# Patient Record
Sex: Female | Born: 1955 | Race: White | Hispanic: No | Smoking: Former smoker
Health system: Southern US, Community
[De-identification: ages and names within clinical notes are randomized; demographics above are authoritative.]

## PROBLEM LIST (undated history)

## (undated) DIAGNOSIS — K219 Gastro-esophageal reflux disease without esophagitis: Secondary | ICD-10-CM

## (undated) DIAGNOSIS — R51 Headache: Secondary | ICD-10-CM

## (undated) DIAGNOSIS — H9190 Unspecified hearing loss, unspecified ear: Secondary | ICD-10-CM

## (undated) DIAGNOSIS — I447 Left bundle-branch block, unspecified: Secondary | ICD-10-CM

## (undated) DIAGNOSIS — F32A Depression, unspecified: Secondary | ICD-10-CM

## (undated) DIAGNOSIS — I456 Pre-excitation syndrome: Secondary | ICD-10-CM

## (undated) DIAGNOSIS — F329 Major depressive disorder, single episode, unspecified: Secondary | ICD-10-CM

## (undated) HISTORY — PX: TUBAL LIGATION: SHX77

## (undated) HISTORY — DX: Depression, unspecified: F32.A

## (undated) HISTORY — DX: Gastro-esophageal reflux disease without esophagitis: K21.9

## (undated) HISTORY — DX: Left bundle-branch block, unspecified: I44.7

## (undated) HISTORY — PX: BIOPSY BOWEL: PRO7

## (undated) HISTORY — DX: Major depressive disorder, single episode, unspecified: F32.9

## (undated) HISTORY — PX: BREAST SURGERY: SHX581

## (undated) HISTORY — DX: Headache: R51

---

## 1998-01-02 ENCOUNTER — Ambulatory Visit (HOSPITAL_COMMUNITY): Admission: RE | Admit: 1998-01-02 | Discharge: 1998-01-02 | Payer: Self-pay | Admitting: Family Medicine

## 1998-01-08 ENCOUNTER — Ambulatory Visit (HOSPITAL_COMMUNITY): Admission: RE | Admit: 1998-01-08 | Discharge: 1998-01-08 | Payer: Self-pay | Admitting: Family Medicine

## 1998-01-14 ENCOUNTER — Ambulatory Visit (HOSPITAL_COMMUNITY): Admission: RE | Admit: 1998-01-14 | Discharge: 1998-01-14 | Payer: Self-pay | Admitting: Family Medicine

## 1998-04-20 HISTORY — PX: PAROTID GLAND TUMOR EXCISION: SHX5221

## 1999-02-27 ENCOUNTER — Encounter: Payer: Self-pay | Admitting: Family Medicine

## 1999-02-27 ENCOUNTER — Ambulatory Visit (HOSPITAL_COMMUNITY): Admission: RE | Admit: 1999-02-27 | Discharge: 1999-02-27 | Payer: Self-pay | Admitting: Family Medicine

## 1999-04-16 ENCOUNTER — Other Ambulatory Visit: Admission: RE | Admit: 1999-04-16 | Discharge: 1999-04-16 | Payer: Self-pay | Admitting: Gastroenterology

## 1999-04-16 ENCOUNTER — Encounter (INDEPENDENT_AMBULATORY_CARE_PROVIDER_SITE_OTHER): Payer: Self-pay | Admitting: *Deleted

## 1999-06-16 ENCOUNTER — Encounter: Admission: RE | Admit: 1999-06-16 | Discharge: 1999-08-04 | Payer: Self-pay | Admitting: Family Medicine

## 2000-03-02 ENCOUNTER — Encounter: Payer: Self-pay | Admitting: Family Medicine

## 2000-03-02 ENCOUNTER — Ambulatory Visit (HOSPITAL_COMMUNITY): Admission: RE | Admit: 2000-03-02 | Discharge: 2000-03-02 | Payer: Self-pay | Admitting: Family Medicine

## 2000-03-05 ENCOUNTER — Encounter: Payer: Self-pay | Admitting: Family Medicine

## 2000-03-05 ENCOUNTER — Encounter: Admission: RE | Admit: 2000-03-05 | Discharge: 2000-03-05 | Payer: Self-pay | Admitting: Family Medicine

## 2002-03-03 ENCOUNTER — Encounter: Payer: Self-pay | Admitting: Family Medicine

## 2002-03-03 ENCOUNTER — Ambulatory Visit (HOSPITAL_COMMUNITY): Admission: RE | Admit: 2002-03-03 | Discharge: 2002-03-03 | Payer: Self-pay | Admitting: Family Medicine

## 2002-03-06 ENCOUNTER — Other Ambulatory Visit: Admission: RE | Admit: 2002-03-06 | Discharge: 2002-03-06 | Payer: Self-pay | Admitting: Family Medicine

## 2003-04-21 HISTORY — PX: CHOLECYSTECTOMY: SHX55

## 2003-04-23 ENCOUNTER — Ambulatory Visit (HOSPITAL_COMMUNITY): Admission: RE | Admit: 2003-04-23 | Discharge: 2003-04-23 | Payer: Self-pay | Admitting: Family Medicine

## 2003-04-26 ENCOUNTER — Encounter: Admission: RE | Admit: 2003-04-26 | Discharge: 2003-04-26 | Payer: Self-pay | Admitting: Family Medicine

## 2003-04-30 ENCOUNTER — Other Ambulatory Visit: Admission: RE | Admit: 2003-04-30 | Discharge: 2003-04-30 | Payer: Self-pay | Admitting: Family Medicine

## 2003-06-13 ENCOUNTER — Encounter (INDEPENDENT_AMBULATORY_CARE_PROVIDER_SITE_OTHER): Payer: Self-pay | Admitting: Specialist

## 2003-06-13 ENCOUNTER — Observation Stay (HOSPITAL_COMMUNITY): Admission: RE | Admit: 2003-06-13 | Discharge: 2003-06-14 | Payer: Self-pay | Admitting: Surgery

## 2003-11-13 ENCOUNTER — Encounter: Admission: RE | Admit: 2003-11-13 | Discharge: 2003-11-13 | Payer: Self-pay | Admitting: Family Medicine

## 2004-04-30 ENCOUNTER — Ambulatory Visit: Payer: Self-pay | Admitting: Family Medicine

## 2004-05-06 ENCOUNTER — Ambulatory Visit: Payer: Self-pay | Admitting: Family Medicine

## 2004-05-14 ENCOUNTER — Ambulatory Visit: Payer: Self-pay | Admitting: Family Medicine

## 2004-06-02 ENCOUNTER — Ambulatory Visit: Payer: Self-pay | Admitting: Family Medicine

## 2004-06-06 ENCOUNTER — Encounter: Admission: RE | Admit: 2004-06-06 | Discharge: 2004-06-06 | Payer: Self-pay | Admitting: Family Medicine

## 2004-09-12 ENCOUNTER — Ambulatory Visit: Payer: Self-pay | Admitting: Family Medicine

## 2005-01-14 ENCOUNTER — Ambulatory Visit: Payer: Self-pay | Admitting: Family Medicine

## 2005-01-29 ENCOUNTER — Ambulatory Visit: Payer: Self-pay | Admitting: Family Medicine

## 2005-06-08 ENCOUNTER — Encounter: Payer: Self-pay | Admitting: Family Medicine

## 2005-06-08 ENCOUNTER — Other Ambulatory Visit: Admission: RE | Admit: 2005-06-08 | Discharge: 2005-06-08 | Payer: Self-pay | Admitting: Family Medicine

## 2005-06-08 ENCOUNTER — Ambulatory Visit: Payer: Self-pay | Admitting: Family Medicine

## 2005-06-08 LAB — CONVERTED CEMR LAB: Pap Smear: NORMAL

## 2005-07-28 ENCOUNTER — Encounter: Admission: RE | Admit: 2005-07-28 | Discharge: 2005-07-28 | Payer: Self-pay | Admitting: Family Medicine

## 2005-08-14 ENCOUNTER — Ambulatory Visit: Payer: Self-pay | Admitting: Family Medicine

## 2005-08-19 ENCOUNTER — Encounter: Payer: Self-pay | Admitting: Family Medicine

## 2005-12-08 ENCOUNTER — Encounter: Admission: RE | Admit: 2005-12-08 | Discharge: 2005-12-08 | Payer: Self-pay | Admitting: Family Medicine

## 2006-07-30 ENCOUNTER — Encounter: Admission: RE | Admit: 2006-07-30 | Discharge: 2006-07-30 | Payer: Self-pay | Admitting: Family Medicine

## 2006-08-25 ENCOUNTER — Ambulatory Visit: Payer: Self-pay | Admitting: Family Medicine

## 2006-08-25 ENCOUNTER — Encounter: Payer: Self-pay | Admitting: Family Medicine

## 2006-08-25 DIAGNOSIS — G43009 Migraine without aura, not intractable, without status migrainosus: Secondary | ICD-10-CM

## 2006-08-25 DIAGNOSIS — K219 Gastro-esophageal reflux disease without esophagitis: Secondary | ICD-10-CM

## 2006-08-25 DIAGNOSIS — H9190 Unspecified hearing loss, unspecified ear: Secondary | ICD-10-CM | POA: Insufficient documentation

## 2006-08-25 DIAGNOSIS — F329 Major depressive disorder, single episode, unspecified: Secondary | ICD-10-CM

## 2006-08-25 DIAGNOSIS — Z8669 Personal history of other diseases of the nervous system and sense organs: Secondary | ICD-10-CM | POA: Insufficient documentation

## 2006-08-25 DIAGNOSIS — Z87898 Personal history of other specified conditions: Secondary | ICD-10-CM

## 2006-08-25 DIAGNOSIS — I10 Essential (primary) hypertension: Secondary | ICD-10-CM | POA: Insufficient documentation

## 2006-09-24 ENCOUNTER — Encounter: Payer: Self-pay | Admitting: Family Medicine

## 2006-09-24 ENCOUNTER — Ambulatory Visit: Payer: Self-pay | Admitting: Family Medicine

## 2006-09-24 ENCOUNTER — Other Ambulatory Visit: Admission: RE | Admit: 2006-09-24 | Discharge: 2006-09-24 | Payer: Self-pay | Admitting: Family Medicine

## 2006-09-24 DIAGNOSIS — Z860100 Personal history of colon polyps, unspecified: Secondary | ICD-10-CM | POA: Insufficient documentation

## 2006-09-24 DIAGNOSIS — N6019 Diffuse cystic mastopathy of unspecified breast: Secondary | ICD-10-CM

## 2006-09-27 LAB — CONVERTED CEMR LAB
BUN: 9 mg/dL (ref 6–23)
Basophils Relative: 0.1 % (ref 0.0–1.0)
Bilirubin, Direct: 0.1 mg/dL (ref 0.0–0.3)
CO2: 25 meq/L (ref 19–32)
Eosinophils Absolute: 0.1 10*3/uL (ref 0.0–0.6)
Eosinophils Relative: 0.8 % (ref 0.0–5.0)
GFR calc Af Amer: 114 mL/min
Glucose, Bld: 91 mg/dL (ref 70–99)
HDL: 69.7 mg/dL (ref >39.0)
Hemoglobin: 13.1 g/dL (ref 12.0–15.0)
Lymphocytes Relative: 28.6 % (ref 12.0–46.0)
MCV: 94.8 fL (ref 78.0–100.0)
Monocytes Absolute: 0.7 10*3/uL (ref 0.2–0.7)
Monocytes Relative: 8.7 % (ref 3.0–11.0)
Neutro Abs: 5 10*3/uL (ref 1.4–7.7)
Potassium: 4.4 meq/L (ref 3.5–5.1)
TSH: 0.43 microintl units/mL (ref 0.35–5.50)
Total Protein: 6.9 g/dL (ref 6.0–8.3)
VLDL: 9 mg/dL (ref 0–40)

## 2006-10-01 ENCOUNTER — Encounter (INDEPENDENT_AMBULATORY_CARE_PROVIDER_SITE_OTHER): Payer: Self-pay | Admitting: *Deleted

## 2006-10-01 LAB — CONVERTED CEMR LAB: Pap Smear: NORMAL

## 2006-11-08 ENCOUNTER — Ambulatory Visit: Payer: Self-pay | Admitting: Family Medicine

## 2006-11-08 DIAGNOSIS — R519 Headache, unspecified: Secondary | ICD-10-CM | POA: Insufficient documentation

## 2006-11-08 DIAGNOSIS — R51 Headache: Secondary | ICD-10-CM | POA: Insufficient documentation

## 2007-01-28 ENCOUNTER — Ambulatory Visit: Payer: Self-pay | Admitting: Family Medicine

## 2007-02-10 ENCOUNTER — Telehealth: Payer: Self-pay | Admitting: Family Medicine

## 2007-03-25 ENCOUNTER — Ambulatory Visit: Payer: Self-pay | Admitting: Family Medicine

## 2007-03-29 ENCOUNTER — Encounter: Payer: Self-pay | Admitting: Family Medicine

## 2007-06-28 ENCOUNTER — Encounter: Payer: Self-pay | Admitting: Family Medicine

## 2007-07-05 ENCOUNTER — Encounter: Payer: Self-pay | Admitting: Family Medicine

## 2007-08-15 ENCOUNTER — Ambulatory Visit: Payer: Self-pay | Admitting: Family Medicine

## 2007-08-15 DIAGNOSIS — J309 Allergic rhinitis, unspecified: Secondary | ICD-10-CM

## 2007-11-14 ENCOUNTER — Encounter: Admission: RE | Admit: 2007-11-14 | Discharge: 2007-11-14 | Payer: Self-pay | Admitting: Family Medicine

## 2007-11-29 ENCOUNTER — Encounter: Payer: Self-pay | Admitting: Family Medicine

## 2007-11-29 ENCOUNTER — Ambulatory Visit: Payer: Self-pay | Admitting: Family Medicine

## 2007-11-29 ENCOUNTER — Other Ambulatory Visit: Admission: RE | Admit: 2007-11-29 | Discharge: 2007-11-29 | Payer: Self-pay | Admitting: Family Medicine

## 2007-11-30 LAB — CONVERTED CEMR LAB
ALT: 13 units/L (ref 0–35)
Basophils Absolute: 0 10*3/uL (ref 0.0–0.1)
Basophils Relative: 0.3 % (ref 0.0–3.0)
CO2: 26 meq/L (ref 19–32)
Calcium: 9.3 mg/dL (ref 8.4–10.5)
Cholesterol: 177 mg/dL (ref 0–200)
Creatinine, Ser: 0.7 mg/dL (ref 0.4–1.2)
Eosinophils Absolute: 0.1 10*3/uL (ref 0.0–0.7)
HCT: 37 % (ref 36.0–46.0)
Hemoglobin: 12.8 g/dL (ref 12.0–15.0)
Lymphocytes Relative: 33.6 % (ref 12.0–46.0)
MCHC: 34.5 g/dL (ref 30.0–36.0)
MCV: 96.9 fL (ref 78.0–100.0)
Neutro Abs: 3.5 10*3/uL (ref 1.4–7.7)
RBC: 3.82 M/uL — ABNORMAL LOW (ref 3.87–5.11)
TSH: 0.64 microintl units/mL (ref 0.35–5.50)
Total Bilirubin: 1.2 mg/dL (ref 0.3–1.2)

## 2007-12-01 LAB — CONVERTED CEMR LAB: Pap Smear: NORMAL

## 2007-12-07 ENCOUNTER — Ambulatory Visit: Payer: Self-pay | Admitting: Gastroenterology

## 2007-12-21 ENCOUNTER — Ambulatory Visit: Payer: Self-pay | Admitting: Gastroenterology

## 2007-12-21 LAB — HM COLONOSCOPY: HM Colonoscopy: NORMAL

## 2008-11-29 ENCOUNTER — Encounter: Payer: Self-pay | Admitting: Family Medicine

## 2008-11-29 ENCOUNTER — Ambulatory Visit: Payer: Self-pay | Admitting: Family Medicine

## 2008-11-29 ENCOUNTER — Other Ambulatory Visit: Admission: RE | Admit: 2008-11-29 | Discharge: 2008-11-29 | Payer: Self-pay | Admitting: Family Medicine

## 2008-11-29 LAB — HM PAP SMEAR

## 2008-11-30 LAB — CONVERTED CEMR LAB
ALT: 16 units/L (ref 0–35)
AST: 18 units/L (ref 0–37)
Albumin: 4 g/dL (ref 3.5–5.2)
Alkaline Phosphatase: 51 units/L (ref 39–117)
Basophils Relative: 0.1 % (ref 0.0–3.0)
CO2: 28 meq/L (ref 19–32)
Calcium: 9.2 mg/dL (ref 8.4–10.5)
Eosinophils Relative: 2 % (ref 0.0–5.0)
GFR calc non Af Amer: 93.35 mL/min (ref >60)
Hemoglobin: 12.8 g/dL (ref 12.0–15.0)
Lymphocytes Relative: 38.2 % (ref 12.0–46.0)
MCHC: 34.5 g/dL (ref 30.0–36.0)
Monocytes Relative: 10.3 % (ref 3.0–12.0)
Neutro Abs: 3.2 10*3/uL (ref 1.4–7.7)
RBC: 3.92 M/uL (ref 3.87–5.11)
Sodium: 140 meq/L (ref 135–145)
Total Protein: 7 g/dL (ref 6.0–8.3)
WBC: 6.3 10*3/uL (ref 4.5–10.5)

## 2008-12-04 ENCOUNTER — Encounter (INDEPENDENT_AMBULATORY_CARE_PROVIDER_SITE_OTHER): Payer: Self-pay | Admitting: *Deleted

## 2009-03-19 ENCOUNTER — Encounter: Admission: RE | Admit: 2009-03-19 | Discharge: 2009-03-19 | Payer: Self-pay | Admitting: Family Medicine

## 2009-03-20 ENCOUNTER — Encounter (INDEPENDENT_AMBULATORY_CARE_PROVIDER_SITE_OTHER): Payer: Self-pay | Admitting: *Deleted

## 2009-04-20 HISTORY — PX: INNER EAR SURGERY: SHX679

## 2009-11-26 ENCOUNTER — Telehealth (INDEPENDENT_AMBULATORY_CARE_PROVIDER_SITE_OTHER): Payer: Self-pay | Admitting: *Deleted

## 2009-11-27 ENCOUNTER — Ambulatory Visit: Payer: Self-pay | Admitting: Family Medicine

## 2009-11-27 LAB — CONVERTED CEMR LAB
Albumin: 3.9 g/dL (ref 3.5–5.2)
Alkaline Phosphatase: 41 units/L (ref 39–117)
Basophils Absolute: 0 10*3/uL (ref 0.0–0.1)
CO2: 27 meq/L (ref 19–32)
Calcium: 9.1 mg/dL (ref 8.4–10.5)
Chloride: 105 meq/L (ref 96–112)
Hemoglobin: 12.5 g/dL (ref 12.0–15.0)
LDL Cholesterol: 98 mg/dL (ref 0–99)
Lymphocytes Relative: 33.3 % (ref 12.0–46.0)
Lymphs Abs: 2 10*3/uL (ref 0.7–4.0)
MCHC: 33.5 g/dL (ref 30.0–36.0)
MCV: 96.6 fL (ref 78.0–100.0)
Monocytes Absolute: 0.6 10*3/uL (ref 0.1–1.0)
RBC: 3.85 M/uL — ABNORMAL LOW (ref 3.87–5.11)
Sodium: 140 meq/L (ref 135–145)
TSH: 0.52 microintl units/mL (ref 0.35–5.50)
Total CHOL/HDL Ratio: 3
WBC: 6 10*3/uL (ref 4.5–10.5)

## 2009-12-04 ENCOUNTER — Ambulatory Visit: Payer: Self-pay | Admitting: Family Medicine

## 2009-12-04 DIAGNOSIS — M549 Dorsalgia, unspecified: Secondary | ICD-10-CM | POA: Insufficient documentation

## 2010-04-08 ENCOUNTER — Encounter
Admission: RE | Admit: 2010-04-08 | Discharge: 2010-04-08 | Payer: Self-pay | Source: Home / Self Care | Attending: Family Medicine | Admitting: Family Medicine

## 2010-04-15 ENCOUNTER — Encounter (INDEPENDENT_AMBULATORY_CARE_PROVIDER_SITE_OTHER): Payer: Self-pay | Admitting: *Deleted

## 2010-04-15 ENCOUNTER — Encounter: Payer: Self-pay | Admitting: Family Medicine

## 2010-05-20 NOTE — Assessment & Plan Note (Signed)
Summary: CPX/DLO   Vital Signs:  Patient profile:   55 year old female Height:      61.25 inches Weight:      130.50 pounds BMI:     24.55 Temp:     98 degrees F oral Pulse rate:   68 / minute Pulse rhythm:   regular BP sitting:   100 / 64  (left arm) Cuff size:   regular  Vitals Entered By: Lewanda Rife LPN (December 04, 2009 9:34 AM) CC: CPX LMP 11/25/09   History of Present Illness: here for health mt exam   wt is down 26 lb with bmi of 24 is avoiding as many carbs  is not exercising as much as she should - but plans to get started with that  has worked on it  since sept -- lost and mt since feb   bp good 100/64  pap was 8/10 nl no new partners or symptoms  last 3 paps nl   mam 11/10 self exam - no lumps or changes   Td 07  lipids are good with trig 44/ HDL 65 and LDL 98  periods are irregular -- thinks close to menupause   has a sore back muscle next to L shoulder blade- feels good to massage it  bothers her a bit at night- generally stiff and sore no rad to arms  hurts it to turn neck  no numbness./ weakness     Allergies: 1)  ! Tetracycline  Past History:  Past Medical History: Last updated: 11/29/2008 Depression GERD headache SK[facility]     derm- Dr Terri Piedra headache/ neurology- Dr Neale Burly   Family History: Last updated: 01-Oct-2006 Father: Deceased, MVA Mother: MS, DM, breast cancer, CVA Siblings: 1 brother with Crohn's, sisters ?  Social History: Last updated: 11/29/2007 Marital Status: Married Children: 2 Occupation:  non smoker goes to the gym regularly   Risk Factors: Smoking Status: never (08/25/2006)  Past Surgical History: Cholecystectomy (2005) Left parotid gland tumor Left breast nodule- neg. Tubal ligation Small bowel biopsy- normal.  Colon polyps/ colonoscopy (03/1999) Multiple surgeries left ear surgery with hearing device implanted L ear 2011  Review of Systems General:  Denies fatigue, fever, loss of appetite, and  malaise. Eyes:  Denies blurring and eye irritation. CV:  Denies chest pain or discomfort and palpitations. Resp:  Denies cough, shortness of breath, and wheezing. GI:  Denies abdominal pain, change in bowel habits, indigestion, and nausea. GU:  Denies abnormal vaginal bleeding, discharge, dysuria, and urinary frequency. MS:  Denies muscle aches and cramps. Derm:  Denies itching, lesion(s), poor wound healing, and rash. Neuro:  Denies numbness and tingling. Psych:  Denies anxiety and depression. Endo:  Denies cold intolerance, excessive thirst, excessive urination, and heat intolerance. Heme:  Denies abnormal bruising and bleeding.  Physical Exam  General:  Well-developed,well-nourished,in no acute distress; alert,appropriate and cooperative throughout examination  wt loss noted  Head:  normocephalic, atraumatic, and no abnormalities observed.   Eyes:  vision grossly intact, pupils equal, pupils round, and pupils reactive to light.  no conjunctival pallor, injection or icterus  Ears:  R ear normal and L ear normal.   Nose:  no nasal discharge.   Mouth:  pharynx pink and moist.   Neck:  supple with full rom and no masses or thyromegally, no JVD or carotid bruit  Chest Wall:  No deformities, masses, or tenderness noted. Breasts:  No mass, nodules, thickening, tenderness, bulging, retraction, inflamation, nipple discharge or skin changes noted.  Lungs:  Normal respiratory effort, chest expands symmetrically. Lungs are clear to auscultation, no crackles or wheezes. Heart:  Normal rate and regular rhythm. S1 and S2 normal without gallop, murmur, click, rub or other extra sounds. Abdomen:  Bowel sounds positive,abdomen soft and non-tender without masses, organomegaly or hernias noted. no renal bruits  Msk:  No deformity or scoliosis noted of thoracic or lumbar spine.   tender over rhomoid area on L  no skin change pain to fully flex neck in that area no neck or spinal tenderness   Pulses:   R and L carotid,radial,femoral,dorsalis pedis and posterior tibial pulses are full and equal bilaterally Extremities:  No clubbing, cyanosis, edema, or deformity noted with normal full range of motion of all joints.   Neurologic:  No cranial nerve deficits noted. Station and gait are normal. Plantar reflexes are down-going bilaterally. DTRs are symmetrical throughout. Sensory, motor and coordinative functions appear intact. Skin:  Intact without suspicious lesions or rashes some lentigos Cervical Nodes:  No lymphadenopathy noted Axillary Nodes:  No palpable lymphadenopathy Inguinal Nodes:  No significant adenopathy Psych:  normal affect, talkative and pleasant    Impression & Recommendations:  Problem # 1:  HEALTH MAINTENANCE EXAM (ICD-V70.0) Assessment Comment Only reviewed health habits including diet, exercise and skin cancer prevention reviewed health maintenance list and family history commended on the wt loss and healthy habits urged to return to regular exercise  rev labs in detail today-- cholesterol is even better   Problem # 2:  HYPERTENSION, ESSENTIAL NOS (ICD-401.9) Assessment: Unchanged  bp is great with wt loss and atenolol  Her updated medication list for this problem includes:    Atenolol 50 Mg Tabs (Atenolol) ..... One by mouth daily  BP today: 100/64 Prior BP: 110/70 (11/29/2008)  Labs Reviewed: K+: 4.5 (11/27/2009) Creat: : 0.6 (11/27/2009)   Chol: 172 (11/27/2009)   HDL: 65.50 (11/27/2009)   LDL: 98 (11/27/2009)   TG: 44.0 (11/27/2009)  Problem # 3:  Hx of DECREASED HEARING (ICD-389.9) Assessment: Improved this is improved with her latest ear surgery  Problem # 4:  BACK PAIN (ICD-724.5) consistent with trap/ rhomboid spasm on L  disc stretches- taught a stretch in office/ continue massage and heat and update if not imp Her updated medication list for this problem includes:    Zanaflex 4 Mg Tabs (Tizanidine hcl) .Marland Kitchen... 1/2 -1 four times a day as needed  headache    Baclofen 10 Mg Tabs (Baclofen) .Marland Kitchen... Take one tablet twice a day as needed for muscle spasm.  Complete Medication List: 1)  Atenolol 50 Mg Tabs (Atenolol) .... One by mouth daily 2)  Zanaflex 4 Mg Tabs (Tizanidine hcl) .... 1/2 -1 four times a day as needed headache 3)  Baclofen 10 Mg Tabs (Baclofen) .... Take one tablet twice a day as needed for muscle spasm.  Patient Instructions: 1)  use heat/ stretches and massage for back muscle -- try the stretch I taught you  2)  if no improvement let me know  3)  no change in medicine  4)  labs look good   Current Allergies (reviewed today): ! TETRACYCLINE

## 2010-05-20 NOTE — Progress Notes (Signed)
----   Converted from flag ---- ---- 11/26/2009 8:56 AM, Judith Part MD wrote: please check wellness and lipid v70.0- thanks   ---- 11/26/2009 8:41 AM, Mills Koller wrote: Patient is scheduled for a CPX with you, I need lab orders with DX, Please. Thanks, Terri ------------------------------

## 2010-05-22 NOTE — Miscellaneous (Signed)
Summary: Mammogram to flowsheet  Clinical Lists Changes  Observations: Added new observation of MAMMO DUE: 04/2011 (04/15/2010 13:23) Added new observation of MAMMOGRAM: normal (04/08/2010 13:24)      Preventive Care Screening  Mammogram:    Date:  04/08/2010    Next Due:  04/2011    Results:  normal

## 2010-05-22 NOTE — Letter (Signed)
Summary: Results Follow up Letter  [place] [facility]  [address] [place], [place] [address]   Phone: [phone]  Fax: [phone]    04/15/2010 MRN: [id number]  DENELLE CAPURRO [address] [place], [place]  [address]  Dear Ms. Hogle,  The following are the results of your recent test(s):  Test         Result    Pap Smear:        Normal _____  Not Normal _____ Comments: ______________________________________________________ Cholesterol: LDL(Bad cholesterol):         Your goal is less than:         HDL (Good cholesterol):       Your goal is more than: Comments:  ______________________________________________________ Mammogram:        Normal __X___  Not Normal _____ Comments: Repeat in 1 year  ___________________________________________________________________ Hemoccult:        Normal _____  Not normal _______ Comments:    _____________________________________________________________________ Other Tests:    We routinely do not discuss normal results over the telephone.  If you desire a copy of the results, or you have any questions about this information we can discuss them at your next office visit.   Sincerely,       Sharilyn Sites for Dr. Roxy Manns

## 2010-07-11 ENCOUNTER — Ambulatory Visit: Payer: Self-pay | Admitting: Family Medicine

## 2010-07-14 ENCOUNTER — Ambulatory Visit: Payer: Self-pay | Admitting: Family Medicine

## 2010-08-02 ENCOUNTER — Encounter: Payer: Self-pay | Admitting: Family Medicine

## 2010-08-11 ENCOUNTER — Encounter: Payer: Self-pay | Admitting: Family Medicine

## 2010-08-11 ENCOUNTER — Ambulatory Visit (INDEPENDENT_AMBULATORY_CARE_PROVIDER_SITE_OTHER): Payer: Managed Care, Other (non HMO) | Admitting: Family Medicine

## 2010-08-11 DIAGNOSIS — N951 Menopausal and female climacteric states: Secondary | ICD-10-CM

## 2010-08-11 DIAGNOSIS — G43009 Migraine without aura, not intractable, without status migrainosus: Secondary | ICD-10-CM

## 2010-08-11 DIAGNOSIS — R232 Flushing: Secondary | ICD-10-CM | POA: Insufficient documentation

## 2010-08-11 NOTE — Progress Notes (Signed)
Subjective:    Patient ID: Felicia Robbins, female    DOB: November 10, 1955, 55 y.o.   MRN: [id number]  HPI Here for hot flashes Thinks it is menopausal Last peroid feb 1st -- then spotted 12 days  Did not get again until this month- just spotted 2 days   Called 3 weeks ago- severe hot flashes  Saw Dr Neale Burly-- in the meantime- disc herbal supplement - and he did not recommend it  She is functional with them- very frequent through the day  Some at night  Sleep is fair Generally hot natured  Wearing layers  Mood is ok Some vaginal dryness No aches/ pains   Headaches are doing pretty good overall  Is still on atenolol  Did not know if black kohash could be helpful   Mother had breast cancer - in her late 81s  Also is herself having a lot more lumpiness in her breast   Wants to know if she can have ibuproen    Is 52 Not a smoker   Past Medical History  Diagnosis Date  . Depression   . GERD (gastroesophageal reflux disease)   . Headache    Past Surgical History  Procedure Date  . Cholecystectomy   . Breast surgery     Lt breast nodule - neg.  . Parotid gland tumor excision   . Tubal ligation   . Biopsy bowel     small bowel biopsy- normal (colon plyps / colonoscopy 03/1999)  . Inner ear surgery 2011    Multiple surgeries Lt ear. Hearing device implanted Lt ear 2011.    reports that she quit smoking about 24 years ago. She does not have any smokeless tobacco history on file. Her alcohol and drug histories not on file. family history includes Cancer in her mother; Crohn's disease in her brother; Diabetes in her mother; Multiple sclerosis in her mother; and Stroke in her mother. Allergies  Allergen Reactions  . Tetracycline     REACTION: rash, passed out         Review of Systems Review of Systems  Constitutional: Negative for fever, appetite change, fatigue and unexpected weight change.  Eyes: Negative for pain and visual disturbance.  Respiratory: Negative  for cough and shortness of breath.   Cardiovascular: Negative.   Gastrointestinal: Negative for nausea, diarrhea and constipation.  Genitourinary: Negative for urgency and frequency.  Endo: hot flashes  Skin: Negative for pallor.  Neurological: Negative for weakness, light-headedness, numbness .  Hematological: Negative for adenopathy. Does not bruise/bleed easily.  Psychiatric/Behavioral: Negative for dysphoric mood. The patient is not nervous/anxious.          Objective:   Physical Exam  Constitutional: She is oriented to person, place, and time. She appears well-developed and well-nourished.  HENT:  Head: Normocephalic and atraumatic.  Right Ear: External ear normal.  Left Ear: External ear normal.  Nose: Nose normal.  Mouth/Throat: Oropharynx is clear and moist.       No sinus tenderness   Eyes: Conjunctivae and EOM are normal. Pupils are equal, round, and reactive to light.  Neck: Normal range of motion. Neck supple. No JVD present. Carotid bruit is not present. No thyromegaly present.  Cardiovascular: Normal rate, regular rhythm and normal heart sounds.   Pulmonary/Chest: Effort normal and breath sounds normal. She has no wheezes.  Abdominal: Soft. Bowel sounds are normal. There is no tenderness.  Musculoskeletal: She exhibits no edema.  Lymphadenopathy:    She has no cervical adenopathy.  Neurological:  She is alert and oriented to person, place, and time. She has normal strength and normal reflexes. No cranial nerve deficit or sensory deficit. She displays a negative Romberg sign. Coordination normal.  Skin: Skin is warm and dry. No rash noted. No pallor.  Psychiatric: She has a normal mood and affect.          Assessment & Plan:

## 2010-08-11 NOTE — Assessment & Plan Note (Signed)
Menopause related and intermittent  I do not recommend HRT in light of fam hx of breast ca and also fibrocystic dz Symptoms are not disabling currently  Disc trial of black kohash otc  Disc exercise/ clean diet  Will keep me updated  Would rather not try clonidine as she is already on a beta blocker

## 2010-08-11 NOTE — Assessment & Plan Note (Signed)
Pt sees Dr Neale Burly at [facility] She asked if she could take occ 800 ibuprofen  I adv her that this could produce rebound - so to minimize it to once monthly maximum- and can disc further with her neurologist as well

## 2010-08-11 NOTE — Patient Instructions (Addendum)
You can try black kohash over the counter for hot flashes as needed  Try to avoid caffine and dress in layers  I do not recommend hrt for you in light of family history of breast cancer  For a bad headache- you can take 4 over the counter ibuprofen (like advil) -- but you can get analgesic rebound with this - so minimize --  To once per month maximum

## 2010-09-05 NOTE — Op Note (Signed)
NAME:  Felicia Robbins, Felicia Robbins                       ACCOUNT NO.:  [id number]   MEDICAL RECORD NO.:  [id number]                   PATIENT TYPE:  AMB   LOCATION:  DAY                                  FACILITY:  [facility]   PHYSICIAN:  Thornton Park. Daphine Deutscher, M.D.             DATE OF BIRTH:  12/24/1955   DATE OF PROCEDURE:  06/13/2003  DATE OF DISCHARGE:                                 OPERATIVE REPORT   PREOPERATIVE DIAGNOSES:  Chronic cholecystitis, possible  choledocholithiasis.   POSTOPERATIVE DIAGNOSES:  Marked chronic cholecystitis with a dilated common  bile duct but with contrast flowing into the duodenum.   PROCEDURE:  Laparoscopic cholecystectomy with intraoperative cholangiogram.   SURGEON:  Thornton Park. Daphine Deutscher, M.D.   ASSISTANT:  Gita Kudo, M.D.   ANESTHESIA:  General endotracheal.   DESCRIPTION OF PROCEDURE:  This is a 55 year old lady who was seen in the  office on January 27 at which time informed consent was obtained regarding  laparoscopic cholecystectomy after a consultation revealed her to have  symptoms of symptomatic gallstones with an ultrasound to support this. She  was taken to room one, given general anesthesia. The abdomen was prepped  with Betadine, draped sterilely.  A longitudinal incision was used to gain  access to the umbilicus and a Hasson technique was used to inflate the  abdomen with the Hasson trocar.  Three trocars were placed in the upper  abdomen. The gallbladder was walled off with omentum and this was stripped  away to reveal a very impacted contracted gallbladder that was very hard. I  dissected down and found a fairly long dilated cystic duct.  I put a clip up  on the gallbladder and used a Reddick catheter to do a cholangiogram. This  showed a dilated common bile duct that was tapering and with free flow into  the duodenum. The cystic duct was triple clip, divided, cystic artery was  triple clipped, divided and the gallbladder was removed from  the gallbladder  without entering it. Subsequently it was put into a bag and brought out  through the umbilicus with a little bit of difficulty and I had to use two  bags but it was removed in toto.  The gallbladder bed was inspected and no  bleeding was noted. The clips were placed.  No bleeding or bile leak was  noted. The umbilical defect was then repaired under direct vision with  interrupted #0 Vicryl sutures.  The scopes were then withdrawn and the  abdomen was deflated. The skin was closed with 4-0 Vicryl with Benzoin and  Steri-Strips.  The patient tolerated the procedure well and was carried to  the recovery room in satisfactory condition.  Thornton Park Daphine Deutscher, M.D.    MBM/MEDQ  D:  06/13/2003  T:  06/13/2003  Job:  [id number]   cc:   Marne A. Milinda Antis, M.D. [facility]

## 2010-11-29 ENCOUNTER — Telehealth: Payer: Self-pay | Admitting: Family Medicine

## 2010-11-29 DIAGNOSIS — I10 Essential (primary) hypertension: Secondary | ICD-10-CM

## 2010-11-29 DIAGNOSIS — Z0000 Encounter for general adult medical examination without abnormal findings: Secondary | ICD-10-CM | POA: Insufficient documentation

## 2010-11-29 NOTE — Telephone Encounter (Signed)
Message copied by Judy Pimple on Sat Nov 29, 2010  2:53 PM ------      Message from: Baldomero Lamy      Created: Wed Nov 26, 2010 10:00 AM      Regarding: Cpx labs tues 8/14       Please order  future cpx labs for pt's upcomming lab appt.      Thanks      Rodney Booze

## 2010-12-02 ENCOUNTER — Other Ambulatory Visit (INDEPENDENT_AMBULATORY_CARE_PROVIDER_SITE_OTHER): Payer: Managed Care, Other (non HMO) | Admitting: Family Medicine

## 2010-12-02 DIAGNOSIS — Z0000 Encounter for general adult medical examination without abnormal findings: Secondary | ICD-10-CM

## 2010-12-02 DIAGNOSIS — I10 Essential (primary) hypertension: Secondary | ICD-10-CM

## 2010-12-02 LAB — COMPREHENSIVE METABOLIC PANEL
BUN: 16 mg/dL (ref 6–23)
CO2: 27 mEq/L (ref 19–32)
Creatinine, Ser: 0.7 mg/dL (ref 0.4–1.2)
GFR: 97.44 mL/min (ref >60.00)
Glucose, Bld: 94 mg/dL (ref 70–99)
Total Bilirubin: 0.3 mg/dL (ref 0.3–1.2)

## 2010-12-02 LAB — CBC WITH DIFFERENTIAL/PLATELET
Eosinophils Relative: 3.2 % (ref 0.0–5.0)
HCT: 38.6 % (ref 36.0–46.0)
Hemoglobin: 12.8 g/dL (ref 12.0–15.0)
Lymphs Abs: 2.1 10*3/uL (ref 0.7–4.0)
MCV: 95.8 fl (ref 78.0–100.0)
Monocytes Absolute: 0.5 10*3/uL (ref 0.1–1.0)
Monocytes Relative: 9.8 % (ref 3.0–12.0)
Neutro Abs: 2.5 10*3/uL (ref 1.4–7.7)
WBC: 5.4 10*3/uL (ref 4.5–10.5)

## 2010-12-02 LAB — LIPID PANEL
Cholesterol: 202 mg/dL — ABNORMAL HIGH (ref 0–200)
HDL: 81.2 mg/dL (ref >39.00)
Triglycerides: 47 mg/dL (ref 0.0–149.0)

## 2010-12-02 LAB — TSH: TSH: 0.46 u[IU]/mL (ref 0.35–5.50)

## 2010-12-08 ENCOUNTER — Encounter: Payer: Self-pay | Admitting: Family Medicine

## 2010-12-08 ENCOUNTER — Other Ambulatory Visit (HOSPITAL_COMMUNITY)
Admission: RE | Admit: 2010-12-08 | Discharge: 2010-12-08 | Disposition: A | Discharge status: Home / Self Care | Payer: Managed Care, Other (non HMO) | Source: Ambulatory Visit | Attending: Family Medicine | Admitting: Family Medicine

## 2010-12-08 ENCOUNTER — Ambulatory Visit (INDEPENDENT_AMBULATORY_CARE_PROVIDER_SITE_OTHER): Payer: Managed Care, Other (non HMO) | Admitting: Family Medicine

## 2010-12-08 DIAGNOSIS — R51 Headache: Secondary | ICD-10-CM

## 2010-12-08 DIAGNOSIS — Z01419 Encounter for gynecological examination (general) (routine) without abnormal findings: Secondary | ICD-10-CM | POA: Insufficient documentation

## 2010-12-08 DIAGNOSIS — Z124 Encounter for screening for malignant neoplasm of cervix: Secondary | ICD-10-CM

## 2010-12-08 DIAGNOSIS — Z23 Encounter for immunization: Secondary | ICD-10-CM

## 2010-12-08 DIAGNOSIS — Z1159 Encounter for screening for other viral diseases: Secondary | ICD-10-CM | POA: Insufficient documentation

## 2010-12-08 DIAGNOSIS — Z0000 Encounter for general adult medical examination without abnormal findings: Secondary | ICD-10-CM

## 2010-12-08 DIAGNOSIS — I10 Essential (primary) hypertension: Secondary | ICD-10-CM

## 2010-12-08 MED ORDER — IBUPROFEN 800 MG PO TABS: 800.0000 mg | ORAL_TABLET | Freq: Three times a day (TID) | ORAL | Status: AC | PRN

## 2010-12-08 NOTE — Assessment & Plan Note (Signed)
Exam done with pap  Pt is in perimenopause tolerating symptoms  Very close to stopping menses Nl exam

## 2010-12-08 NOTE — Assessment & Plan Note (Signed)
Reviewed health habits including diet and exercise and skin cancer prevention Also reviewed health mt list, fam hx and immunizations  Wellness lab reviewed - with high HDL -good zostavax today- pt gets it free as husband works for [employer]

## 2010-12-08 NOTE — Progress Notes (Signed)
Subjective:    Patient ID: Felicia Robbins, female    DOB: 1955-12-04, 55 y.o.   MRN: [id number]  HPI Here for annual health mt exam and to review chronic problems   Wt is up 10 lb   Pap 8/10 normal  Did not do one this year  Was going thru hot flashes this spring-- tolerating them  peroid in April and July  Slowing down and very light - spotting  Will do pap today    Mam 12/11 Mother had breast cancer Will send her a reminder No changes on self exam  Less fibrocystic pain   Td was 2/07  colonosc 9/09 normal  GI recommended recall in 10y  Lipids are up a bit LDL 122 but high HDL  Lab Results  Component Value Date   CHOL 202* 12/02/2010   CHOL 172 11/27/2009   CHOL 177 11/29/2007   Lab Results  Component Value Date   HDL 81.20 12/02/2010   HDL 59.56 11/27/2009   HDL 38.7 11/29/2007   Lab Results  Component Value Date   LDLCALC 98 11/27/2009   LDLCALC 109* 11/29/2007   LDLCALC 105* 09/24/2006   Lab Results  Component Value Date   TRIG 47.0 12/02/2010   TRIG 44.0 11/27/2009   TRIG 60 11/29/2007   Lab Results  Component Value Date   CHOLHDL 2 12/02/2010   CHOLHDL 3 11/27/2009   CHOLHDL 3.2 CALC 11/29/2007   Lab Results  Component Value Date   LDLDIRECT 122.6 12/02/2010   diet is not great -- too much fried food and red meat  Does also eat a lot of broccoli and greens   Exercise is here and there  Not enough   Needs ibuprofen 800 to take occ for pre menstual  Headache -- will use sparingly to avoid rebound effect Overall her headaches have begun to improve Beta blocker is helpful  Patient Active Problem List  Diagnoses  . MIGRAINE, COMMON  . DECREASED HEARING  . HYPERTENSION, ESSENTIAL NOS  . RHINITIS  . Esophageal Reflux  . FIBROCYSTIC BREAST DISEASE  . BACK PAIN  . HEADACHE  . HX, PERSONAL, COLONIC POLYPS  . MIGRAINES, HX OF  . Hot flashes  . Routine general medical examination at a health care facility  . Gynecologic exam normal   Past Medical  History  Diagnosis Date  . Depression   . GERD (gastroesophageal reflux disease)   . Headache    Past Surgical History  Procedure Date  . Cholecystectomy   . Breast surgery     Lt breast nodule - neg.  . Parotid gland tumor excision   . Tubal ligation   . Biopsy bowel     small bowel biopsy- normal (colon plyps / colonoscopy 03/1999)  . Inner ear surgery 2011    Multiple surgeries Lt ear. Hearing device implanted Lt ear 2011.   History  Substance Use Topics  . Smoking status: Former Smoker    Quit date: 04/20/1986  . Smokeless tobacco: Not on file  . Alcohol Use: Not on file   Family History  Problem Relation Age of Onset  . Cancer Mother     breast  . Diabetes Mother   . Stroke Mother   . Multiple sclerosis Mother   . Crohn's disease Brother    Allergies  Allergen Reactions  . Tetracycline     REACTION: rash, passed out   Current Outpatient Prescriptions on File Prior to Visit  Medication Sig Dispense Refill  .  atenolol (TENORMIN) 50 MG tablet Take 50 mg by mouth daily.        Marland Kitchen tiZANidine (ZANAFLEX) 4 MG tablet 1/2 - 1 tablet by mouth four times a day as needed for headache.       . baclofen (LIORESAL) 10 MG tablet Take 10 mg by mouth 2 (two) times daily as needed.            Review of Systems Review of Systems  Constitutional: Negative for fever, appetite change, fatigue and unexpected weight change.  Eyes: Negative for pain and visual disturbance.  Respiratory: Negative for cough and shortness of breath.   Cardiovascular: Negative.  for cp or palpitations or edema Gastrointestinal: Negative for nausea, diarrhea and constipation.  Genitourinary: Negative for urgency and frequency. pos for menstrual irreg and hot flashes  Skin: Negative for pallor. or rash  Neurological: Negative for weakness, light-headedness, numbness and headaches.  Hematological: Negative for adenopathy. Does not bruise/bleed easily.  Psychiatric/Behavioral: Negative for dysphoric mood.  The patient is not nervous/anxious.          Objective:   Physical Exam  Constitutional: She appears well-developed and well-nourished. No distress.       overwt and well appearing   HENT:  Head: Normocephalic and atraumatic.  Right Ear: External ear normal.  Left Ear: External ear normal.  Nose: Nose normal.  Mouth/Throat: Oropharynx is clear and moist.       Baseline ear exam with scarring on L  Eyes: Conjunctivae and EOM are normal. Pupils are equal, round, and reactive to light.  Neck: Normal range of motion. Neck supple. No JVD present. Carotid bruit is not present. No thyromegaly present.  Cardiovascular: Normal rate, regular rhythm, normal heart sounds and intact distal pulses.   Pulmonary/Chest: Effort normal and breath sounds normal. No respiratory distress. She has no wheezes.  Abdominal: Soft. Bowel sounds are normal. She exhibits no distension, no abdominal bruit and no mass. There is no tenderness.  Musculoskeletal: Normal range of motion. She exhibits no edema and no tenderness.  Lymphadenopathy:    She has no cervical adenopathy.  Neurological: She is alert. She has normal reflexes. She displays normal reflexes. No cranial nerve deficit. Coordination normal.  Skin: Skin is warm and dry. No rash noted. No erythema. No pallor.       lentigos diffusely  Psychiatric: She has a normal mood and affect.          Assessment & Plan:

## 2010-12-08 NOTE — Patient Instructions (Signed)
I think you are getting closer to menopause- hang in there  Try to aim for 5 days of exercise per week  Zoster (shingles vaccine today)  Labs and cholesterol look ok

## 2010-12-08 NOTE — Assessment & Plan Note (Signed)
Overall is improving with onset of menopause Px ibuprofen 800 for prn use with food (watching for GI upset) - also counseled on rebound headache avoidance

## 2010-12-08 NOTE — Assessment & Plan Note (Signed)
bp is good  On tenormin which also helps with headaches

## 2010-12-11 ENCOUNTER — Encounter: Payer: Self-pay | Admitting: Family Medicine

## 2010-12-11 ENCOUNTER — Ambulatory Visit (INDEPENDENT_AMBULATORY_CARE_PROVIDER_SITE_OTHER): Payer: Managed Care, Other (non HMO) | Admitting: Family Medicine

## 2010-12-11 DIAGNOSIS — R42 Dizziness and giddiness: Secondary | ICD-10-CM | POA: Insufficient documentation

## 2010-12-11 MED ORDER — MECLIZINE HCL 25 MG PO TABS: 25.0000 mg | ORAL_TABLET | Freq: Three times a day (TID) | ORAL | Status: DC | PRN

## 2010-12-11 NOTE — Progress Notes (Signed)
  Subjective:    Patient ID: Felicia Robbins, female    DOB: 1956/03/16, 55 y.o.   MRN: [id number]  HPI Pt of Dr Royden Purl here as acute appt for feeling off balance. She was last seen three days ago for annual PE.  On her way to work today she was very dizzy and then when got to work she was hitting walls. She also has developed pain in the left nasal area into the sinus area. She denies congestion, she denies headache per se, no fever or chills. She was freezing at work with reported nml ambient temp by coworkers. No ear pain, no rhinitis, some ST, no Cough, no N/V.  She saw the work nurse and was given phenylephrine which she took and then slept and dizziness went away.  She has had multiple left ear surgeries for hearing loss with last being hearing device implantation in 2011. 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   Review of SystemsNoncontributory except as above.       Objective:   Physical Exam  Constitutional: She appears well-developed and well-nourished. No distress.  HENT:  Head: Normocephalic and atraumatic.  Right Ear: External ear normal.  Left Ear: External ear normal.  Nose: Nose normal.  Mouth/Throat: Oropharynx is clear and moist. No oropharyngeal exudate.       R TM nml, L TM scarred and distorted.  Eyes: Conjunctivae and EOM are normal. Pupils are equal, round, and reactive to light.  Neck: Normal range of motion. Neck supple. No thyromegaly present.  Cardiovascular: Normal rate, regular rhythm and normal heart sounds.   Pulmonary/Chest: Effort normal and breath sounds normal. She has no wheezes. She has no rales.  Lymphadenopathy:    She has no cervical adenopathy.  Skin: She is not diaphoretic.          Assessment & Plan:

## 2010-12-11 NOTE — Patient Instructions (Signed)
Use Antivert per label as needed for dizziness. Could useOTC Dramamine. Take Guaifenesin (400mg ), take 11/2 tabs by mouth AM and NOON. Get GUAIFENESIN by  going to [facility] or [facility] and getting MUCOUS RELIEF EXPECTORANT/CONGESTION. DO NOT GET MUCINEX (Timed Release Guaifenesin)

## 2010-12-11 NOTE — Assessment & Plan Note (Signed)
Discussed pathophysiology of vertigo. See instructions.

## 2011-01-08 ENCOUNTER — Telehealth: Payer: Self-pay

## 2011-01-08 NOTE — Telephone Encounter (Signed)
Pap smear collected on 12/08/10 I could not find instructions to tell pt. Pap results appear to be listed under lab tab and I made a copy which is on your shelf in the in box for further instructions. Thank you.

## 2011-01-08 NOTE — Telephone Encounter (Signed)
Please let her know pap is normal -thanks

## 2011-01-08 NOTE — Telephone Encounter (Signed)
Letter mailed to patient as instructed.Health maintenance updated. 

## 2011-05-26 ENCOUNTER — Other Ambulatory Visit: Payer: Self-pay | Admitting: Family Medicine

## 2011-05-26 DIAGNOSIS — Z1231 Encounter for screening mammogram for malignant neoplasm of breast: Secondary | ICD-10-CM

## 2011-06-03 ENCOUNTER — Ambulatory Visit
Admission: RE | Admit: 2011-06-03 | Discharge: 2011-06-03 | Disposition: A | Discharge status: Home / Self Care | Payer: Managed Care, Other (non HMO) | Source: Ambulatory Visit | Attending: Family Medicine | Admitting: Family Medicine

## 2011-06-03 DIAGNOSIS — Z1231 Encounter for screening mammogram for malignant neoplasm of breast: Secondary | ICD-10-CM

## 2011-06-04 ENCOUNTER — Other Ambulatory Visit: Payer: Self-pay | Admitting: Family Medicine

## 2011-06-04 DIAGNOSIS — N63 Unspecified lump in unspecified breast: Secondary | ICD-10-CM

## 2011-06-05 ENCOUNTER — Telehealth: Payer: Self-pay | Admitting: Family Medicine

## 2011-06-05 DIAGNOSIS — N6320 Unspecified lump in the left breast, unspecified quadrant: Secondary | ICD-10-CM | POA: Insufficient documentation

## 2011-06-05 NOTE — Telephone Encounter (Signed)
Pt went Wednesday 06/03/11 to the [facility] and they could not do a screening because she has developed and they found a lump in her left breast. They need a new order for a Diagnostic Mammogram.

## 2011-06-05 NOTE — Telephone Encounter (Signed)
I will do order-thanks , she has a hx of breast cysts if I remember correctly

## 2011-06-08 ENCOUNTER — Other Ambulatory Visit: Payer: Self-pay | Admitting: Dermatology

## 2011-06-24 ENCOUNTER — Ambulatory Visit
Admission: RE | Admit: 2011-06-24 | Discharge: 2011-06-24 | Disposition: A | Discharge status: Home / Self Care | Payer: Managed Care, Other (non HMO) | Source: Ambulatory Visit | Attending: Family Medicine | Admitting: Family Medicine

## 2011-06-24 ENCOUNTER — Other Ambulatory Visit: Payer: Self-pay | Admitting: Family Medicine

## 2011-06-24 DIAGNOSIS — N6320 Unspecified lump in the left breast, unspecified quadrant: Secondary | ICD-10-CM

## 2011-06-29 ENCOUNTER — Ambulatory Visit
Admission: RE | Admit: 2011-06-29 | Discharge: 2011-06-29 | Disposition: A | Discharge status: Home / Self Care | Payer: Managed Care, Other (non HMO) | Source: Ambulatory Visit | Attending: Family Medicine | Admitting: Family Medicine

## 2011-06-29 ENCOUNTER — Other Ambulatory Visit: Payer: Self-pay | Admitting: Family Medicine

## 2011-06-29 DIAGNOSIS — N6320 Unspecified lump in the left breast, unspecified quadrant: Secondary | ICD-10-CM

## 2011-06-29 DIAGNOSIS — N6002 Solitary cyst of left breast: Secondary | ICD-10-CM

## 2011-08-03 ENCOUNTER — Ambulatory Visit (INDEPENDENT_AMBULATORY_CARE_PROVIDER_SITE_OTHER): Payer: Managed Care, Other (non HMO) | Admitting: Family Medicine

## 2011-08-03 ENCOUNTER — Encounter: Payer: Self-pay | Admitting: Family Medicine

## 2011-08-03 VITALS — BP 130/72 | HR 70 | Temp 98.5°F | Ht 60.25 in | Wt 161.0 lb

## 2011-08-03 DIAGNOSIS — M7061 Trochanteric bursitis, right hip: Secondary | ICD-10-CM | POA: Insufficient documentation

## 2011-08-03 DIAGNOSIS — M7062 Trochanteric bursitis, left hip: Secondary | ICD-10-CM

## 2011-08-03 DIAGNOSIS — M76899 Other specified enthesopathies of unspecified lower limb, excluding foot: Secondary | ICD-10-CM

## 2011-08-03 MED ORDER — IBUPROFEN 800 MG PO TABS: 800.0000 mg | ORAL_TABLET | Freq: Three times a day (TID) | ORAL | Status: AC | PRN

## 2011-08-03 MED ORDER — TIZANIDINE HCL 4 MG PO TABS: 4.0000 mg | ORAL_TABLET | Freq: Every evening | ORAL | Status: DC | PRN

## 2011-08-03 NOTE — Progress Notes (Signed)
Subjective:    Patient ID: Felicia Robbins, female    DOB: Aug 01, 1955, 56 y.o.   MRN: [id number]  HPI Here for leg cramps  Also hips have been sore lately- on the outside   Leg cramps going on since Wednesday of last week  Mainly in calves - both legs Aching pain in both legs - not a pain , not a "charlie horse" She paces and tries to work it out  No new activity   Started some 800 mg ibuprofen -- and it helps a bit   No regular exercise  No new herbs or meds otc   Period stopped - last July - is still going through menopause  Hot flashes/ night sweats and aches and pains  Wt is up 15 lb with bmi of 31  Patient Active Problem List  Diagnoses  . MIGRAINE, COMMON  . DECREASED HEARING  . HYPERTENSION, ESSENTIAL NOS  . RHINITIS  . Esophageal Reflux  . FIBROCYSTIC BREAST DISEASE  . HEADACHE  . HX, PERSONAL, COLONIC POLYPS  . MIGRAINES, HX OF  . Hot flashes  . Routine general medical examination at a health care facility  . Gynecologic exam normal  . Vertigo  . Lump of breast, left  . Trochanteric bursitis of both hips   Past Medical History  Diagnosis Date  . Depression   . GERD (gastroesophageal reflux disease)   . Headache    Past Surgical History  Procedure Date  . Cholecystectomy   . Breast surgery     Lt breast nodule - neg.  . Parotid gland tumor excision   . Tubal ligation   . Biopsy bowel     small bowel biopsy- normal (colon plyps / colonoscopy 03/1999)  . Inner ear surgery 2011    Multiple surgeries Lt ear. Hearing device implanted Lt ear 2011.   History  Substance Use Topics  . Smoking status: Former Smoker    Quit date: 04/20/1986  . Smokeless tobacco: Never Used  . Alcohol Use: Yes     2-3 drinks a week   Family History  Problem Relation Age of Onset  . Cancer Mother     breast  . Diabetes Mother   . Stroke Mother   . Multiple sclerosis Mother   . Crohn's disease Brother    Allergies  Allergen Reactions  . Tetracycline    REACTION: rash, passed out   Current Outpatient Prescriptions on File Prior to Visit  Medication Sig Dispense Refill  . atenolol (TENORMIN) 50 MG tablet Take 50 mg by mouth daily.            Review of Systems Review of Systems  Constitutional: Negative for fever, appetite change, fatigue and unexpected weight change.  Eyes: Negative for pain and visual disturbance.  Respiratory: Negative for cough and shortness of breath.   Cardiovascular: Negative for cp or palpitations    Gastrointestinal: Negative for nausea, diarrhea and constipation.  Genitourinary: Negative for urgency and frequency. pos for hot flashes and menopause symptoms  Skin: Negative for pallor or rash   MSK pos for leg pain, neg for joint swelling  Neurological: Negative for weakness, light-headedness, numbness and headaches.  Hematological: Negative for adenopathy. Does not bruise/bleed easily.  Psychiatric/Behavioral: Negative for dysphoric mood. The patient is not nervous/anxious.         Objective:   Physical Exam  Constitutional: She appears well-developed and well-nourished. No distress.  HENT:  Head: Normocephalic and atraumatic.  Mouth/Throat: Oropharynx is clear and moist.  Eyes: Conjunctivae and EOM are normal. Pupils are equal, round, and reactive to light. No scleral icterus.  Neck: Normal range of motion. Neck supple. No JVD present. No thyromegaly present.  Cardiovascular: Normal rate, regular rhythm, normal heart sounds and intact distal pulses.   Pulmonary/Chest: Effort normal and breath sounds normal. No respiratory distress. She has no wheezes.  Musculoskeletal: She exhibits tenderness. She exhibits no edema.       bilat tenderness over greater trochanter- worse on L  Some pain to internally rotate hips  No LS or knee tenderness No crepitus  Lymphadenopathy:    She has no cervical adenopathy.  Neurological: She is alert. She has normal strength and normal reflexes. She displays no atrophy. No  cranial nerve deficit or sensory deficit. She exhibits normal muscle tone. Coordination normal.  Skin: Skin is warm and dry. No rash noted. No erythema. No pallor.  Psychiatric: She has a normal mood and affect.          Assessment & Plan:

## 2011-08-03 NOTE — Assessment & Plan Note (Signed)
I think this is causing the pain in the lower legs as well  In addition- more aches and pains in general with menopause Disc need for exercise- since walking seems to help Will tx with ibuprofen with food tid prn  Also tizanidine at night as needed Disc stretching/ walking/ heat/ cold Ref to Dr Patsy Lager for further eval Given handout as well

## 2011-08-03 NOTE — Patient Instructions (Addendum)
Use ice and heat on hips at night  Take the ibuprofen with food  Take the muscle relaxer at night if needed Make appt with Dr Patsy Lager for hip (trochanteric) bursitis - at check out

## 2011-08-10 ENCOUNTER — Encounter: Payer: Self-pay | Admitting: Family Medicine

## 2011-08-10 ENCOUNTER — Ambulatory Visit (INDEPENDENT_AMBULATORY_CARE_PROVIDER_SITE_OTHER): Payer: Managed Care, Other (non HMO) | Admitting: Family Medicine

## 2011-08-10 VITALS — BP 140/80 | HR 71 | Temp 97.8°F | Ht 61.0 in | Wt 154.8 lb

## 2011-08-10 DIAGNOSIS — M76899 Other specified enthesopathies of unspecified lower limb, excluding foot: Secondary | ICD-10-CM

## 2011-08-10 DIAGNOSIS — M7061 Trochanteric bursitis, right hip: Secondary | ICD-10-CM

## 2011-08-10 NOTE — Progress Notes (Signed)
  Patient Name: Felicia Robbins Date of Birth: [birth year] Age: 56 y.o. Medical Record Number: [id number] Gender: female Date of Encounter: 08/10/2011  History of Present Illness:  Felicia Robbins is a 56 y.o. very pleasant female patient who presents with the following:  About the last wed or so before, felt crampy and tired. Has noticed a few weeks has been having some on the outside, mostly on the right side.   No trauma or accident. No back pain. No significant history of hip pain, or back pain. She has had some aching in her legs and the 5 lower extremities for the last few weeks. She works at a desk all day long. Her weight is 154 pounds. The patient describes no radiculopathy. No numbness or tingling or radiating pain at all. No pain in the back. The pain in the thoracic spine. No significant buttocks pain. She has tried some 800 mg ibuprofen, which has not really helped significantly. She also has some mild left-sided lateral hip pain, but the RIGHT side is much more significant.  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     Past Medical History, Surgical History, Social History, Family History, Problem List, Medications, and Allergies have been reviewed and updated if relevant.  Review of Systems:  GEN: No fevers, chills. Nontoxic. Primarily MSK c/o today. MSK: Detailed in the HPI GI: tolerating PO intake without difficulty Neuro: No numbness, parasthesias, or tingling associated. Otherwise the pertinent positives of the ROS are noted above.    Physical Examination: Filed Vitals:   08/10/11 0924  BP: 140/80  Pulse: 71  Temp: 97.8 F (36.6 C)  TempSrc: Oral  Height: 5\' 1"  (1.549 m)  Weight: 154 lb 12.8 oz (70.217 kg)  SpO2: 99%    Body mass index is 29.25 kg/(m^2).   GEN: WDWN, NAD, Non-toxic, Alert & Oriented x 3 HEENT: Atraumatic, Normocephalic.  Ears and Nose: No external deformity. EXTR: No clubbing/cyanosis/edema NEURO: Normal gait.  PSYCH: Normally interactive. Conversant.  Not depressed or anxious appearing.  Calm demeanor.   HIP EXAM: SIDE: B ROM: Abduction, Flexion, Internal and External range of motion: excellent and full Pain with terminal IROM and EROM: no Log roll neg GTB: R GTB markedly ttp SLR: NEG Knees: No effusion FABER: NT REVERSE FABER: NT, neg Piriformis: NT at direct palpation Str: flexion: 5/5 abduction: 4/5 on R, 5/5 on L adduction: 5/5 Strength testing non-tender LL equal     Assessment and Plan: 1. Trochanteric bursitis of right hip     Overall strong with some right-sided hip abductor weakness. It's unclear if that was one of the preceding elements are that is secondary due to pain. I suspect that this probably came about when she started having some mild  Lower extremity pain and had some gait disturbance. We will start her doing some hip rehabilitation, stretching, and stabilization. She is also given a go back to start doing Yoga  Trochanteric Bursitis Injection, R Verbal consent obtained. Risks (including infection, potential atrophy), benefits, and alternatives reviewed. Greater trochanter sterilely prepped with Chloraprep. Ethyl Chloride used for anesthesia. 8 cc of Lidocaine 1% injected with 2 cc of 40 mg Depo-Medrol into trochanteric bursa at area of maximal tenderness at greater trochanter. Needle taken to bone to troch bursa, flows easily. Bursa massaged. No bleeding and no complications. Decreased pain after injection. Needle: 22 gauge spinal needle

## 2011-08-10 NOTE — Patient Instructions (Signed)
Trochanteric Bursa Rehab  Stretches: Cross Leg, "sink stretch" - can do all day during work day [employer] over [employer], laying down, hold 5-10 sec x 3  Hip Abduction - build to 3 sets of 30 and then add weights begin with no weight. When 3 x 30 reached, Add 2 lb. ankle weight. Increase to 3,4,5,6 when 3x30 achieved.  Knee up and open hip: knee up and externally rotate hip to open position, hold 2 sec and repeat each leg, 30 reps  BONUS Cone Drills: Right Leg, Right Hand Right Leg, Left Hand Left Leg, Right Hand Left Leg, Left Hand Start with 1 cone, progress to 3 20 each exercise

## 2011-11-03 ENCOUNTER — Ambulatory Visit (INDEPENDENT_AMBULATORY_CARE_PROVIDER_SITE_OTHER): Payer: Managed Care, Other (non HMO) | Admitting: Family Medicine

## 2011-11-03 ENCOUNTER — Ambulatory Visit (INDEPENDENT_AMBULATORY_CARE_PROVIDER_SITE_OTHER)
Admission: RE | Admit: 2011-11-03 | Discharge: 2011-11-03 | Disposition: A | Discharge status: Home / Self Care | Payer: Managed Care, Other (non HMO) | Source: Ambulatory Visit | Attending: Family Medicine | Admitting: Family Medicine

## 2011-11-03 ENCOUNTER — Encounter: Payer: Self-pay | Admitting: Family Medicine

## 2011-11-03 VITALS — BP 138/76 | HR 73 | Temp 98.0°F | Ht 61.0 in | Wt 145.2 lb

## 2011-11-03 DIAGNOSIS — M25562 Pain in left knee: Secondary | ICD-10-CM

## 2011-11-03 DIAGNOSIS — M79644 Pain in right finger(s): Secondary | ICD-10-CM

## 2011-11-03 DIAGNOSIS — M25569 Pain in unspecified knee: Secondary | ICD-10-CM

## 2011-11-03 DIAGNOSIS — M79609 Pain in unspecified limb: Secondary | ICD-10-CM

## 2011-11-03 MED ORDER — IBUPROFEN 800 MG PO TABS: 800.0000 mg | ORAL_TABLET | Freq: Three times a day (TID) | ORAL | Status: AC | PRN

## 2011-11-03 NOTE — Assessment & Plan Note (Signed)
After a fall upon the knee yesterday - with medial and patellar pain and some mild bruising  Can bear wt  Xray today  Soft knee band / ice / elevation recommended and use of a cane if needed  Ibuprofen 800 tid with food as tolerated  Doubt fracture but pend xray report  Meniscal tear is possible - but no effusion which is reassuring

## 2011-11-03 NOTE — Patient Instructions (Addendum)
Take ibuprofen 800 mg with meal up to 3 times per day  Ice to both finger and knee as often as you can for 10 minutes at a time  You can use an elasic band on the knee if it makes it feel better  Use a cane to off load some pressure on the left (use it on the left)  Xray today Will update you today or tomorrow with results

## 2011-11-03 NOTE — Assessment & Plan Note (Signed)
After a "jamming" trauma to R ring finger during a fall  Pain is in dip joint with some mild hyperemia and bruising -no swelling Has nl rom Will xray

## 2011-11-03 NOTE — Progress Notes (Signed)
Subjective:    Patient ID: Felicia Robbins, female    DOB: 1955/10/09, 56 y.o.   MRN: [id number]  HPI Here for knee pain after a fall  Was out to dinner and caught herself on slippery floor- caught finger and fell directly on knee   L knee- sore medially and swollen on top of knee Did use ice for 4 hours last night  Knee hurts most to flex it and go up stairs   R ring finger is painful  Was trying to grab on to a chair - jammed it into the chair - distal knuckle hurts and is swollen  Can move it and grip things - but it is sore  Used ice on her hand initially also  Patient Active Problem List  Diagnosis  . MIGRAINE, COMMON  . DECREASED HEARING  . HYPERTENSION, ESSENTIAL NOS  . RHINITIS  . Esophageal Reflux  . FIBROCYSTIC BREAST DISEASE  . HEADACHE  . HX, PERSONAL, COLONIC POLYPS  . MIGRAINES, HX OF  . Hot flashes  . Routine general medical examination at a health care facility  . Gynecologic exam normal  . Vertigo  . Lump of breast, left  . Trochanteric bursitis of both hips  . Finger pain, right  . Knee pain, left   Past Medical History  Diagnosis Date  . Depression   . GERD (gastroesophageal reflux disease)   . Headache    Past Surgical History  Procedure Date  . Cholecystectomy   . Breast surgery     Lt breast nodule - neg.  . Parotid gland tumor excision   . Tubal ligation   . Biopsy bowel     small bowel biopsy- normal (colon plyps / colonoscopy 03/1999)  . Inner ear surgery 2011    Multiple surgeries Lt ear. Hearing device implanted Lt ear 2011.   History  Substance Use Topics  . Smoking status: Former Smoker    Quit date: 04/20/1986  . Smokeless tobacco: Never Used  . Alcohol Use: Yes     2-3 drinks a week   Family History  Problem Relation Age of Onset  . Cancer Mother     breast  . Diabetes Mother   . Stroke Mother   . Multiple sclerosis Mother   . Crohn's disease Brother    Allergies  Allergen Reactions  . Tetracycline    REACTION: rash, passed out   Current Outpatient Prescriptions on File Prior to Visit  Medication Sig Dispense Refill  . tiZANidine (ZANAFLEX) 4 MG tablet Take 1 tablet (4 mg total) by mouth at bedtime as needed. 1/2 - 1 tablet by mouth four times a day as needed for headache.  30 tablet  1  . atenolol (TENORMIN) 50 MG tablet Take 50 mg by mouth daily.              Review of Systems Review of Systems  Constitutional: Negative for fever, appetite change, fatigue and unexpected weight change.  Eyes: Negative for pain and visual disturbance.  Respiratory: Negative for cough and shortness of breath.   Cardiovascular: Negative for cp or palpitations    Gastrointestinal: Negative for nausea, diarrhea and constipation.  Genitourinary: Negative for urgency and frequency.  Skin: Negative for pallor or rash   MSK pos for knee and hand pain with swelling and some bruising, neg for other injuries  Neurological: Negative for weakness, light-headedness, numbness and headaches.  Hematological: Negative for adenopathy. Does not bruise/bleed easily.  Psychiatric/Behavioral: Negative for dysphoric mood. The  patient is not nervous/anxious.         Objective:   Physical Exam  Constitutional: She appears well-developed and well-nourished.  HENT:  Head: Normocephalic and atraumatic.  Eyes: Conjunctivae and EOM are normal. Pupils are equal, round, and reactive to light.  Neck: Normal range of motion. Neck supple.  Cardiovascular: Normal rate and regular rhythm.   Musculoskeletal: She exhibits edema and tenderness.       Left knee: She exhibits decreased range of motion, swelling, ecchymosis and bony tenderness. She exhibits no effusion, no deformity, no laceration, no erythema and normal patellar mobility. tenderness found. Medial joint line and patellar tendon tenderness noted.       Right hand: She exhibits tenderness, bony tenderness and swelling. She exhibits normal capillary refill, no deformity  and no laceration. normal sensation noted. Normal strength noted.       L knee- diffuse ecchymosis over patella Tender medial joint line/ patella and tendon Flex 90 deg with some pain / pos mc murray/ neg bounce test  Favors other leg Knee is stable-nl drawer and lachman exam  R ring finger- erythema distally which is mild Very mild swelling  Tender at distal pip joint and tip of finger Nl rom   Neurological: She is alert. She has normal strength and normal reflexes. No sensory deficit.  Skin: Skin is warm and dry. No rash noted. No pallor.  Psychiatric: She has a normal mood and affect.          Assessment & Plan:

## 2011-11-10 ENCOUNTER — Encounter: Payer: Self-pay | Admitting: Family Medicine

## 2011-11-10 ENCOUNTER — Ambulatory Visit (INDEPENDENT_AMBULATORY_CARE_PROVIDER_SITE_OTHER): Payer: Managed Care, Other (non HMO) | Admitting: Family Medicine

## 2011-11-10 VITALS — BP 124/78 | HR 70 | Temp 98.0°F | Ht 61.0 in | Wt 147.0 lb

## 2011-11-10 DIAGNOSIS — R51 Headache: Secondary | ICD-10-CM

## 2011-11-10 DIAGNOSIS — M79609 Pain in unspecified limb: Secondary | ICD-10-CM

## 2011-11-10 DIAGNOSIS — M79644 Pain in right finger(s): Secondary | ICD-10-CM

## 2011-11-10 NOTE — Patient Instructions (Addendum)
I'm glad you are doing well  Be cautious with your finger- wear your over the counter splint when you can/ when you are not typing  Ice finger and knee if you need to and keep me updated if pain returns  Make appt with your ENT Dr Jac Canavan for the left sided facial pain

## 2011-11-10 NOTE — Assessment & Plan Note (Signed)
Reviewed xray of R ring finger-possible tuft fracture but not sure In light of exam/ mobility and lack of symptoms- is doubtful at this time Pt will continue to wear otc splint when not typing and update if any pain or swelling  Re assuring exam

## 2011-11-10 NOTE — Assessment & Plan Note (Signed)
Chronic L facial pain over L maxillary sinus without tenderness or any other symptoms   (she mentioned this on the way out) ? etiol- has hx of migraine and ear problems  No hx of TMJ Considered CT of sinuses- but no cong or tenderness Will f/u with her ENT soon for further eval

## 2011-11-10 NOTE — Progress Notes (Signed)
Subjective:    Patient ID: Felicia Robbins, female    DOB: 1956/03/11, 56 y.o.   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 MIGRAINE, COMMON  . DECREASED HEARING  . HYPERTENSION, ESSENTIAL NOS  . RHINITIS  . Esophageal Reflux  . FIBROCYSTIC BREAST DISEASE  . HEADACHE  . HX, PERSONAL, COLONIC POLYPS  . MIGRAINES, HX OF  . Hot flashes  . Routine general medical examination at a health care facility  . Gynecologic exam normal  . Vertigo  . Lump of breast, left  . Trochanteric bursitis of both hips  . Finger pain, right  . Knee pain, left  . Left facial pain   Past Medical History  Diagnosis Date  . Depression   . GERD (gastroesophageal reflux disease)   . Headache    Past Surgical History  Procedure Date  . Cholecystectomy   . Breast surgery     Lt breast nodule - neg.  . Parotid gland tumor excision   . Tubal ligation   . Biopsy bowel     small bowel biopsy- normal (colon plyps / colonoscopy 03/1999)  . Inner ear surgery 2011    Multiple surgeries Lt ear. Hearing device implanted Lt ear 2011.   History  Substance Use Topics  . Smoking status: Former Smoker    Quit date: 04/20/1986  . Smokeless tobacco: Never Used  . Alcohol Use: Yes     2-3 drinks a week   Family History  Problem Relation Age of Onset  . Cancer Mother     breast  . Diabetes Mother   . Stroke Mother   . Multiple sclerosis  Mother   . Crohn's disease Brother    Allergies  Allergen Reactions  . Tetracycline     REACTION: rash, passed out   Current Outpatient Prescriptions on File Prior to Visit  Medication Sig Dispense Refill  . atenolol (TENORMIN) 50 MG tablet Take 50 mg by mouth daily.        Marland Kitchen ibuprofen (ADVIL,MOTRIN) 800 MG tablet Take 1 tablet (800 mg total) by mouth every 8 (eight) hours as needed for pain (with food ).  30 tablet  1  . tiZANidine (ZANAFLEX) 4 MG tablet Take 1 tablet (4 mg total) by mouth at bedtime as needed. 1/2 - 1 tablet by mouth four times a day as needed for headache.  30 tablet  1        Review of Systems Review of Systems  Constitutional: Negative for fever, appetite change, fatigue and unexpected weight change.  Eyes: Negative for pain and visual disturbance.  ENT pos for L facial pain, neg for congestion or st or purulent nasal drainage  Respiratory: Negative for cough and shortness of breath.   Cardiovascular: Negative for cp or palpitations  Gastrointestinal: Negative for nausea, diarrhea and constipation.  Genitourinary: Negative for urgency and frequency.  Skin: Negative for pallor or rash   MSK pos for knee pain that is improved, neg for swelling  Neurological: Negative for weakness, light-headedness, numbness and headaches.  Hematological: Negative for adenopathy. Does not bruise/bleed easily.  Psychiatric/Behavioral: Negative for dysphoric mood. The patient is not nervous/anxious.         Objective:   Physical Exam  Constitutional: She appears well-developed and well-nourished. No distress.  HENT:  Head: Normocephalic and atraumatic.  Right Ear: External ear normal.  Left Ear: External ear normal.  Nose: Nose normal.  Mouth/Throat: Oropharynx is clear and moist.       No sinus or temple tenderness  Eyes: Conjunctivae and EOM are normal. Pupils are equal, round, and reactive to light. Right eye exhibits no discharge. Left eye exhibits no discharge.    Neck: Normal range of motion. Neck supple.  Cardiovascular: Normal rate and regular rhythm.   Pulmonary/Chest: Effort normal and breath sounds normal.  Musculoskeletal: She exhibits no edema and no tenderness.       L ring finger - no longer has any redness or swelling Non tender Nl rom in all directions   Lymphadenopathy:    She has no cervical adenopathy.  Neurological: She is alert.  Skin: Skin is warm and dry. No erythema.  Psychiatric: She has a normal mood and affect.          Assessment & Plan:

## 2012-01-11 ENCOUNTER — Telehealth: Payer: Self-pay | Admitting: Family Medicine

## 2012-01-11 DIAGNOSIS — Z0000 Encounter for general adult medical examination without abnormal findings: Secondary | ICD-10-CM

## 2012-01-11 NOTE — Telephone Encounter (Signed)
Message copied by Judy Pimple on Mon Jan 11, 2012 10:06 PM ------      Message from: Alvina Chou      Created: Thu Jan 07, 2012  3:22 PM      Regarding: Lab orders for, Tuesday 9.24       Patient is scheduled for CPX labs, please order future labs, Thanks , Camelia Eng

## 2012-01-12 ENCOUNTER — Other Ambulatory Visit (INDEPENDENT_AMBULATORY_CARE_PROVIDER_SITE_OTHER): Payer: Managed Care, Other (non HMO)

## 2012-01-12 DIAGNOSIS — Z0000 Encounter for general adult medical examination without abnormal findings: Secondary | ICD-10-CM

## 2012-01-12 LAB — CBC WITH DIFFERENTIAL/PLATELET
Basophils Absolute: 0 10*3/uL (ref 0.0–0.1)
HCT: 39.2 % (ref 36.0–46.0)
Hemoglobin: 12.9 g/dL (ref 12.0–15.0)
Lymphs Abs: 2 10*3/uL (ref 0.7–4.0)
MCHC: 33 g/dL (ref 30.0–36.0)
MCV: 94.4 fl (ref 78.0–100.0)
Monocytes Absolute: 0.5 10*3/uL (ref 0.1–1.0)
Monocytes Relative: 8.8 % (ref 3.0–12.0)
Neutro Abs: 3.2 10*3/uL (ref 1.4–7.7)
Platelets: 229 10*3/uL (ref 150.0–400.0)
RDW: 14 % (ref 11.5–14.6)

## 2012-01-12 LAB — COMPREHENSIVE METABOLIC PANEL
ALT: 14 U/L (ref 0–35)
Alkaline Phosphatase: 51 U/L (ref 39–117)
CO2: 26 mEq/L (ref 19–32)
Creatinine, Ser: 0.7 mg/dL (ref 0.4–1.2)
GFR: 92.26 mL/min (ref >60.00)
Sodium: 143 mEq/L (ref 135–145)
Total Bilirubin: 0.6 mg/dL (ref 0.3–1.2)

## 2012-01-12 LAB — LIPID PANEL
Cholesterol: 220 mg/dL — ABNORMAL HIGH (ref 0–200)
HDL: 86.9 mg/dL (ref >39.00)
Total CHOL/HDL Ratio: 3
VLDL: 12 mg/dL (ref 0.0–40.0)

## 2012-01-12 LAB — LDL CHOLESTEROL, DIRECT: Direct LDL: 116.4 mg/dL

## 2012-01-12 NOTE — Addendum Note (Signed)
Addended by: Alvina Chou on: 01/12/2012 08:35 AM   Modules accepted: Orders

## 2012-01-19 ENCOUNTER — Encounter: Payer: Self-pay | Admitting: Family Medicine

## 2012-01-19 ENCOUNTER — Ambulatory Visit (INDEPENDENT_AMBULATORY_CARE_PROVIDER_SITE_OTHER): Payer: Managed Care, Other (non HMO) | Admitting: Family Medicine

## 2012-01-19 VITALS — BP 158/84 | HR 92 | Temp 98.3°F | Ht 61.25 in | Wt 147.2 lb

## 2012-01-19 DIAGNOSIS — I10 Essential (primary) hypertension: Secondary | ICD-10-CM

## 2012-01-19 DIAGNOSIS — Z0000 Encounter for general adult medical examination without abnormal findings: Secondary | ICD-10-CM

## 2012-01-19 DIAGNOSIS — F439 Reaction to severe stress, unspecified: Secondary | ICD-10-CM | POA: Insufficient documentation

## 2012-01-19 DIAGNOSIS — R4589 Other symptoms and signs involving emotional state: Secondary | ICD-10-CM

## 2012-01-19 MED ORDER — ATENOLOL 50 MG PO TABS: 50.0000 mg | ORAL_TABLET | Freq: Every day | ORAL | Status: DC

## 2012-01-19 MED ORDER — PAROXETINE HCL 20 MG PO TABS: 20.0000 mg | ORAL_TABLET | ORAL | Status: DC

## 2012-01-19 MED ORDER — TIZANIDINE HCL 4 MG PO TABS: 4.0000 mg | ORAL_TABLET | Freq: Every evening | ORAL | Status: DC | PRN

## 2012-01-19 NOTE — Assessment & Plan Note (Signed)
bp up a bit - no doubt with stress and also off beta blocker  Will start back on tenormin Disc lifstyle change F/u 4-5 wk

## 2012-01-19 NOTE — Assessment & Plan Note (Signed)
Reviewed health habits including diet and exercise and skin cancer prevention Also reviewed health mt list, fam hx and immunizations  Rev wellness labs today   

## 2012-01-19 NOTE — Patient Instructions (Addendum)
Do not forget to get the flu shot at work  Do not exceed one alcoholic beverage per day  Start paxil as directed- if side effects or worse let me know -- start with 1/2 pill by mouth once daily for 2 weeks and then increase to 1 pill per day  Follow up with me in 4-5 weeks Try to take care of yourself and continue counseling
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
## 2012-02-17 ENCOUNTER — Encounter: Payer: Self-pay | Admitting: Family Medicine

## 2012-02-17 ENCOUNTER — Ambulatory Visit (INDEPENDENT_AMBULATORY_CARE_PROVIDER_SITE_OTHER): Payer: Managed Care, Other (non HMO) | Admitting: Family Medicine

## 2012-02-17 VITALS — BP 114/62 | HR 62 | Temp 98.5°F | Ht 61.75 in | Wt 146.8 lb

## 2012-02-17 DIAGNOSIS — R4589 Other symptoms and signs involving emotional state: Secondary | ICD-10-CM

## 2012-02-17 DIAGNOSIS — I10 Essential (primary) hypertension: Secondary | ICD-10-CM

## 2012-02-17 DIAGNOSIS — F439 Reaction to severe stress, unspecified: Secondary | ICD-10-CM

## 2012-02-17 NOTE — Assessment & Plan Note (Signed)
Much improved back on atenolol  Good habits Will continue to follow bp in fair control at this time  No changes needed  Disc lifstyle change with low sodium diet and exercise

## 2012-02-17 NOTE — Patient Instructions (Addendum)
I'm glad you are doing better bp is good Continue current medicines

## 2012-02-17 NOTE — Assessment & Plan Note (Signed)
Much improved anxiety on paxil Nl appetite and sleep  Is seeing counselor and going to [employer] - commended on this

## 2012-02-17 NOTE — Progress Notes (Signed)
Subjective:    Patient ID: Felicia Robbins, female    DOB: 02-02-1956, 56 y.o.   MRN: [id number]  HPI Here for HTN and also stress reaction   Back on tenormin  bp is better now - feels good   Had a drug reaction to an nsaid - ? With the tenormin She stopped the nsaid  Has knee problems - had MRI - is improved    Started back on paxil for anx symptoms from stress reaction Feels better  Emotionally feeling better  Situational stress has not changed Is dragging her feet currently with the plan  Has not seen the counselor in 2 week Is going to [facility] meeting  Patient Active Problem List  Diagnosis  . MIGRAINE, COMMON  . DECREASED HEARING  . HYPERTENSION, ESSENTIAL NOS  . RHINITIS  . Esophageal Reflux  . FIBROCYSTIC BREAST DISEASE  . HEADACHE  . HX, PERSONAL, COLONIC POLYPS  . MIGRAINES, HX OF  . Hot flashes  . Routine general medical examination at a health care facility  . Gynecologic exam normal  . Vertigo  . Lump of breast, left  . Trochanteric bursitis of both hips  . Finger pain, right  . Knee pain, left  . Left facial pain  . Stress reaction, emotional   Past Medical History  Diagnosis Date  . Depression   . GERD (gastroesophageal reflux disease)   . Headache    Past Surgical History  Procedure Date  . Cholecystectomy   . Breast surgery     Lt breast nodule - neg.  . Parotid gland tumor excision   . Tubal ligation   . Biopsy bowel     small bowel biopsy- normal (colon plyps / colonoscopy 03/1999)  . Inner ear surgery 2011    Multiple surgeries Lt ear. Hearing device implanted Lt ear 2011.   History  Substance Use Topics  . Smoking status: Former Smoker    Quit date: 04/20/1986  . Smokeless tobacco: Never Used  . Alcohol Use: Yes     2-3 drinks a week   Family History  Problem Relation Age of Onset  . Cancer Mother     breast  . Diabetes Mother   . Stroke Mother   . Multiple sclerosis Mother   . Crohn's disease Brother    Allergies    Allergen Reactions  . Tetracycline     REACTION: rash, passed out   Current Outpatient Prescriptions on File Prior to Visit  Medication Sig Dispense Refill  . atenolol (TENORMIN) 50 MG tablet Take 1 tablet (50 mg total) by mouth daily.  30 tablet  11  . PARoxetine (PAXIL) 20 MG tablet Take 1 tablet (20 mg total) by mouth every morning.  30 tablet  11  . tiZANidine (ZANAFLEX) 4 MG tablet Take 1 tablet (4 mg total) by mouth at bedtime as needed. 1/2 - 1 tablet by mouth four times a day as needed for headache.  30 tablet  3      Review of Systems Review of Systems  Constitutional: Negative for fever, appetite change, fatigue and unexpected weight change.  Eyes: Negative for pain and visual disturbance.  Respiratory: Negative for cough and shortness of breath.   Cardiovascular: Negative for cp or palpitations    Gastrointestinal: Negative for nausea, diarrhea and constipation.  Genitourinary: Negative for urgency and frequency.  Skin: Negative for pallor or rash   Neurological: Negative for weakness, light-headedness, numbness and headaches.  Hematological: Negative for adenopathy. Does not bruise/bleed  easily.  Psychiatric/Behavioral: Negative for dysphoric mood. The patient is not nervous/anxious.         Objective:   Physical Exam  Constitutional: She appears well-developed and well-nourished. No distress.  HENT:  Head: Normocephalic and atraumatic.  Right Ear: External ear normal.  Left Ear: External ear normal.  Nose: Nose normal.  Mouth/Throat: Oropharynx is clear and moist.  Eyes: Conjunctivae normal and EOM are normal. Pupils are equal, round, and reactive to light. No scleral icterus.  Neck: Normal range of motion. Neck supple. No JVD present. Carotid bruit is not present. No thyromegaly present.  Pulmonary/Chest: Effort normal and breath sounds normal.  Abdominal: She exhibits no abdominal bruit.  Musculoskeletal: She exhibits no edema and no tenderness.   Lymphadenopathy:    She has no cervical adenopathy.  Neurological: She is alert. She has normal reflexes. She displays no tremor. No cranial nerve deficit. She exhibits normal muscle tone. Coordination normal.  Skin: Skin is warm and dry. No rash noted. No erythema. No pallor.  Psychiatric: She has a normal mood and affect.       Improved Talkative/ relaxed/ attentive Not tearful          Assessment & Plan:

## 2012-04-22 ENCOUNTER — Ambulatory Visit (INDEPENDENT_AMBULATORY_CARE_PROVIDER_SITE_OTHER): Payer: Managed Care, Other (non HMO) | Admitting: Family Medicine

## 2012-04-22 ENCOUNTER — Encounter: Payer: Self-pay | Admitting: Family Medicine

## 2012-04-22 VITALS — BP 122/80 | HR 86 | Temp 98.5°F | Ht 61.75 in | Wt 149.8 lb

## 2012-04-22 DIAGNOSIS — J069 Acute upper respiratory infection, unspecified: Secondary | ICD-10-CM | POA: Insufficient documentation

## 2012-04-22 NOTE — Assessment & Plan Note (Signed)
Much improved s/p vertigo symptoms and cough Disc symptomatic care - see instructions on AVS -- has meclizine in case vertigo comes back Reassuring exam Update if not starting to improve in a week or if worsening

## 2012-04-22 NOTE — Progress Notes (Signed)
Subjective:    Patient ID: Felicia Robbins, female    DOB: Mar 20, 1956, 57 y.o.   MRN: [id number]  HPI Here with uri symptoms  Started at christmas - with bad cough and bronchial irritation That started to get better and then she had several days of vertigo Stayed in bed all day jan 1   She had some meclizine but never used it   Today feeling much better  Now is just a little irritative cough - mucous is clear No fever   Throat and ears ok  Runny nose - with a sore in her L nostril   Patient Active Problem List  Diagnosis  . MIGRAINE, COMMON  . DECREASED HEARING  . HYPERTENSION, ESSENTIAL NOS  . RHINITIS  . Esophageal Reflux  . FIBROCYSTIC BREAST DISEASE  . HEADACHE  . HX, PERSONAL, COLONIC POLYPS  . MIGRAINES, HX OF  . Hot flashes  . Routine general medical examination at a health care facility  . Gynecologic exam normal  . Vertigo  . Lump of breast, left  . Trochanteric bursitis of both hips  . Finger pain, right  . Knee pain, left  . Left facial pain  . Stress reaction, emotional   Past Medical History  Diagnosis Date  . Depression   . GERD (gastroesophageal reflux disease)   . Headache    Past Surgical History  Procedure Date  . Cholecystectomy   . Breast surgery     Lt breast nodule - neg.  . Parotid gland tumor excision   . Tubal ligation   . Biopsy bowel     small bowel biopsy- normal (colon plyps / colonoscopy 03/1999)  . Inner ear surgery 2011    Multiple surgeries Lt ear. Hearing device implanted Lt ear 2011.   History  Substance Use Topics  . Smoking status: Former Smoker    Quit date: 04/20/1986  . Smokeless tobacco: Never Used  . Alcohol Use: Yes     Comment: 2-3 drinks a week   Family History  Problem Relation Age of Onset  . Cancer Mother     breast  . Diabetes Mother   . Stroke Mother   . Multiple sclerosis Mother   . Crohn's disease Brother    Allergies  Allergen Reactions  . Tetracycline     REACTION: rash, passed out    Current Outpatient Prescriptions on File Prior to Visit  Medication Sig Dispense Refill  . atenolol (TENORMIN) 50 MG tablet Take 1 tablet (50 mg total) by mouth daily.  30 tablet  11  . PARoxetine (PAXIL) 20 MG tablet Take 1 tablet (20 mg total) by mouth every morning.  30 tablet  11  . tiZANidine (ZANAFLEX) 4 MG tablet Take 1 tablet (4 mg total) by mouth at bedtime as needed. 1/2 - 1 tablet by mouth four times a day as needed for headache.  30 tablet  3         Review of Systems Review of Systems  Constitutional: Negative for fever, appetite change, and unexpected weight change.  ENT pos for rhinorrhea, neg for ST or sinus tendenress  Eyes: Negative for pain and visual disturbance.  Respiratory: Negative for wheeze and shortness of breath.   Cardiovascular: Negative for cp or palpitations    Gastrointestinal: Negative for nausea, diarrhea and constipation.  Genitourinary: Negative for urgency and frequency.  Skin: Negative for pallor or rash   Neurological: Negative for weakness, light-headedness, numbness and headaches.  Hematological: Negative for adenopathy. Does not  bruise/bleed easily.  Psychiatric/Behavioral: Negative for dysphoric mood. The patient is not nervous/anxious.         Objective:   Physical Exam  Constitutional: She appears well-developed and well-nourished. No distress.  HENT:  Head: Normocephalic and atraumatic.  Right Ear: External ear normal.  Left Ear: External ear normal.  Mouth/Throat: Oropharynx is clear and moist. No oropharyngeal exudate.       Nares are boggy with clear rhinorrhea  No sinus tenderness Throat clear - post nasal clear drainage  Eyes: Conjunctivae normal and EOM are normal. Pupils are equal, round, and reactive to light. Right eye exhibits no discharge. Left eye exhibits no discharge.       No nystagmus today  Neck: Normal range of motion. Neck supple.  Cardiovascular: Normal rate and regular rhythm.   Pulmonary/Chest: Effort  normal and breath sounds normal. No respiratory distress. She has no wheezes. She has no rales. She exhibits no tenderness.  Lymphadenopathy:    She has no cervical adenopathy.  Neurological: She is alert. She has normal reflexes. No cranial nerve deficit. She exhibits normal muscle tone. Coordination and gait normal.       No focal cerebellar signs   Skin: Skin is warm and dry. No rash noted. No erythema. No pallor.  Psychiatric: She has a normal mood and affect.          Assessment & Plan:

## 2012-04-22 NOTE — Patient Instructions (Addendum)
Drink lots of fluids and get rest  Cough may come and go -mucinex DM is helpful  If vertigo returns, try the meclizine  Update if not starting to improve in a week or if worsening  - especially

## 2012-05-03 ENCOUNTER — Encounter: Payer: Self-pay | Admitting: Family Medicine

## 2012-05-03 ENCOUNTER — Ambulatory Visit (INDEPENDENT_AMBULATORY_CARE_PROVIDER_SITE_OTHER): Payer: Managed Care, Other (non HMO) | Admitting: Family Medicine

## 2012-05-03 ENCOUNTER — Ambulatory Visit (INDEPENDENT_AMBULATORY_CARE_PROVIDER_SITE_OTHER)
Admission: RE | Admit: 2012-05-03 | Discharge: 2012-05-03 | Disposition: A | Discharge status: Home / Self Care | Payer: Managed Care, Other (non HMO) | Source: Ambulatory Visit | Attending: Family Medicine | Admitting: Family Medicine

## 2012-05-03 VITALS — BP 140/82 | HR 65 | Temp 98.4°F | Ht 61.75 in | Wt 151.8 lb

## 2012-05-03 DIAGNOSIS — M79641 Pain in right hand: Secondary | ICD-10-CM

## 2012-05-03 DIAGNOSIS — M79609 Pain in unspecified limb: Secondary | ICD-10-CM

## 2012-05-03 NOTE — Assessment & Plan Note (Signed)
Pain over 2nd/ 3rd MCP and middle finger after a fall/ trauma  Limited rom due to pain  Prelim xray- no fx but waiting on rad review Used ace bandage for gentle supportive wrap and use cold compress/ ibuprofen prn  If no imp will ref for ortho eval

## 2012-05-03 NOTE — Progress Notes (Signed)
Subjective:    Patient ID: Felicia Robbins, female    DOB: 07-12-1955, 57 y.o.   MRN: [id number]  HPI Here for hand pain after a fall   Last night was walking and not paying attention and she misstepped off the sidewalk  Landed on her L knee Hit 2 fingers on L hand - that is improved R hand - a lot of pain in 2-4th fingers and top of hand- this continues today- (thinks she caught herself on a flexed hand - dorsal side)  Her hand is not bruised and a bit swollen  It is most painful to grasp or make a fist   No meds Did put ice on it last night   Patient Active Problem List  Diagnosis  . MIGRAINE, COMMON  . DECREASED HEARING  . HYPERTENSION, ESSENTIAL NOS  . RHINITIS  . Esophageal Reflux  . FIBROCYSTIC BREAST DISEASE  . HX, PERSONAL, COLONIC POLYPS  . MIGRAINES, HX OF  . Hot flashes  . Routine general medical examination at a health care facility  . Gynecologic exam normal  . Vertigo  . Lump of breast, left  . Trochanteric bursitis of both hips  . Stress reaction, emotional   Past Medical History  Diagnosis Date  . Depression   . GERD (gastroesophageal reflux disease)   . Headache    Past Surgical History  Procedure Date  . Cholecystectomy   . Breast surgery     Lt breast nodule - neg.  . Parotid gland tumor excision   . Tubal ligation   . Biopsy bowel     small bowel biopsy- normal (colon plyps / colonoscopy 03/1999)  . Inner ear surgery 2011    Multiple surgeries Lt ear. Hearing device implanted Lt ear 2011.   History  Substance Use Topics  . Smoking status: Former Smoker    Quit date: 04/20/1986  . Smokeless tobacco: Never Used  . Alcohol Use: Yes     Comment: 2-3 drinks a week   Family History  Problem Relation Age of Onset  . Cancer Mother     breast  . Diabetes Mother   . Stroke Mother   . Multiple sclerosis Mother   . Crohn's disease Brother    Allergies  Allergen Reactions  . Tetracycline     REACTION: rash, passed out   Current  Outpatient Prescriptions on File Prior to Visit  Medication Sig Dispense Refill  . atenolol (TENORMIN) 50 MG tablet Take 1 tablet (50 mg total) by mouth daily.  30 tablet  11  . PARoxetine (PAXIL) 20 MG tablet Take 1 tablet (20 mg total) by mouth every morning.  30 tablet  11  . tiZANidine (ZANAFLEX) 4 MG tablet Take 1 tablet (4 mg total) by mouth at bedtime as needed. 1/2 - 1 tablet by mouth four times a day as needed for headache.  30 tablet  3       Review of Systems Review of Systems  Constitutional: Negative for fever, appetite change, fatigue and unexpected weight change.  Eyes: Negative for pain and visual disturbance.  Respiratory: Negative for cough and shortness of breath.   Cardiovascular: Negative for cp or palpitations    Gastrointestinal: Negative for nausea, diarrhea and constipation.  Genitourinary: Negative for urgency and frequency.  Skin: Negative for pallor or rash   Msk pos for R hand pain , neg for weakness  Neurological: Negative for weakness, light-headedness, numbness and headaches.  Hematological: Negative for adenopathy. Does not bruise/bleed easily.  Psychiatric/Behavioral: Negative for dysphoric mood. The patient is not nervous/anxious.         Objective:   Physical Exam  Constitutional: She appears well-developed and well-nourished. No distress.  HENT:  Head: Normocephalic and atraumatic.  Neck: Normal range of motion. Neck supple.  Musculoskeletal: She exhibits edema and tenderness.       Right hand: She exhibits decreased range of motion, tenderness, bony tenderness and swelling. She exhibits normal capillary refill, no deformity and no laceration. normal sensation noted. Normal strength noted.       R hand - tender in between 2nd and 3rd MCP joints with scant swelling (no bruising ) and also mid 3rd phalange Cannot grip due to pain  No redness or skin interruption   Neurological: She is alert. She has normal strength and normal reflexes. No sensory  deficit.  Skin: Skin is warm and dry. No rash noted. No erythema. No pallor.       Abrasion clean on L knee  Psychiatric: She has a normal mood and affect.          Assessment & Plan:

## 2012-05-03 NOTE — Patient Instructions (Addendum)
We will call you when radiologist reads your film- I suspect there is no fracture Wear ace bandage until this feels better Use cold compress/ ice whenever possible for 10 minutes at a time Ibuprofen otc as directed with food for 1-2 weeks If no improvement at that time- call and let me know

## 2012-06-04 ENCOUNTER — Other Ambulatory Visit: Payer: Self-pay

## 2012-06-07 ENCOUNTER — Ambulatory Visit (INDEPENDENT_AMBULATORY_CARE_PROVIDER_SITE_OTHER): Payer: Managed Care, Other (non HMO) | Admitting: Family Medicine

## 2012-06-07 ENCOUNTER — Encounter: Payer: Self-pay | Admitting: Family Medicine

## 2012-06-07 VITALS — BP 132/68 | HR 87 | Temp 98.6°F | Ht 61.75 in | Wt 146.2 lb

## 2012-06-07 DIAGNOSIS — Z23 Encounter for immunization: Secondary | ICD-10-CM

## 2012-06-07 DIAGNOSIS — W64XXXA Exposure to other animate mechanical forces, initial encounter: Secondary | ICD-10-CM

## 2012-06-07 DIAGNOSIS — L089 Local infection of the skin and subcutaneous tissue, unspecified: Secondary | ICD-10-CM

## 2012-06-07 MED ORDER — AMOXICILLIN-POT CLAVULANATE 875-125 MG PO TABS: 1.0000 | ORAL_TABLET | Freq: Two times a day (BID) | ORAL | Status: DC

## 2012-06-07 NOTE — Assessment & Plan Note (Signed)
8 relatively shallow and small (longest 1.5 cm) scratches on forearm- and one above elbow - with medial oval shaped area or erythema and swelling  Suspect infection Update Tdap augmentin -10 day course Re check fri Did outline with permanent marker- and pt will call if erythema extends at all or if other worsening symptoms incl fever

## 2012-06-07 NOTE — Progress Notes (Signed)
Subjective:    Patient ID: Felicia Robbins, female    DOB: 01/30/1956, 57 y.o.   MRN: [id number]  HPI Here with a cat scratch on her R arm that looks infected to her  This happened on Sunday - she was holding him and he scratched her arm when she moved it  No bite  4-5 areas of scratch - resulting in puncture wounds  Washed it immediately - with soap for sensitive skin Put neosporin on it  Now has redness medial and posterior to scratch on her R forearm  Is sore   No fever/ feels ok   Patient Active Problem List  Diagnosis  . MIGRAINE, COMMON  . DECREASED HEARING  . HYPERTENSION, ESSENTIAL NOS  . RHINITIS  . Esophageal Reflux  . FIBROCYSTIC BREAST DISEASE  . HX, PERSONAL, COLONIC POLYPS  . MIGRAINES, HX OF  . Hot flashes  . Routine general medical examination at a health care facility  . Gynecologic exam normal  . Vertigo  . Lump of breast, left  . Trochanteric bursitis of both hips  . Stress reaction, emotional  . Right hand pain   Past Medical History  Diagnosis Date  . Depression   . GERD (gastroesophageal reflux disease)   . Headache    Past Surgical History  Procedure Laterality Date  . Cholecystectomy    . Breast surgery      Lt breast nodule - neg.  . Parotid gland tumor excision    . Tubal ligation    . Biopsy bowel      small bowel biopsy- normal (colon plyps / colonoscopy 03/1999)  . Inner ear surgery  2011    Multiple surgeries Lt ear. Hearing device implanted Lt ear 2011.   History  Substance Use Topics  . Smoking status: Former Smoker    Quit date: 04/20/1986  . Smokeless tobacco: Never Used  . Alcohol Use: Yes     Comment: 2-3 drinks a week   Family History  Problem Relation Age of Onset  . Cancer Mother     breast  . Diabetes Mother   . Stroke Mother   . Multiple sclerosis Mother   . Crohn's disease Brother    Allergies  Allergen Reactions  . Tetracycline     REACTION: rash, passed out   Current Outpatient Prescriptions on  File Prior to Visit  Medication Sig Dispense Refill  . atenolol (TENORMIN) 50 MG tablet Take 1 tablet (50 mg total) by mouth daily.  30 tablet  11  . PARoxetine (PAXIL) 20 MG tablet Take 1 tablet (20 mg total) by mouth every morning.  30 tablet  11  . tiZANidine (ZANAFLEX) 4 MG tablet Take 1 tablet (4 mg total) by mouth at bedtime as needed. 1/2 - 1 tablet by mouth four times a day as needed for headache.  30 tablet  3   No current facility-administered medications on file prior to visit.      Review of Systems Review of Systems  Constitutional: Negative for fever, appetite change, fatigue and unexpected weight change.  Eyes: Negative for pain and visual disturbance.  Respiratory: Negative for cough and shortness of breath.   Cardiovascular: Negative for cp or palpitations    Gastrointestinal: Negative for nausea, diarrhea and constipation.  Genitourinary: Negative for urgency and frequency.  Skin: Negative for pallor or rash  pos for redness over area or injury/ neg for drainage Neurological: Negative for weakness, light-headedness, numbness and headaches.  Hematological: Negative for adenopathy. Does  not bruise/bleed easily.  Psychiatric/Behavioral: Negative for dysphoric mood. The patient is not nervous/anxious.         Objective:   Physical Exam  Constitutional: She appears well-developed and well-nourished. No distress.  HENT:  Head: Normocephalic and atraumatic.  Eyes: Conjunctivae and EOM are normal. Pupils are equal, round, and reactive to light.  Neck: Normal range of motion. Neck supple.  Cardiovascular: Normal rate and regular rhythm.   Lymphadenopathy:    She has no cervical adenopathy.  Neurological: She is alert. She has normal strength. No sensory deficit. She exhibits normal muscle tone.  Skin: Skin is warm and dry. No rash noted. There is erythema. No pallor.  R forearm- 7 small scratch marks with oval area of erythema extending posterior and medially with mild  swelling  Area is tender No drainage  one scratch area just above antecubital area  Nl rom arm and hand  Psychiatric: She has a normal mood and affect.          Assessment & Plan:

## 2012-06-07 NOTE — Patient Instructions (Addendum)
Take the augmentin as directed Keep very clean  Antibiotic ointment is ok  If worse redness (past the line ) - let me know asap or if fever or worse pain  Tetanus update today Follow up on Friday for a re check

## 2012-06-10 ENCOUNTER — Ambulatory Visit: Payer: Managed Care, Other (non HMO) | Admitting: Family Medicine

## 2012-12-26 ENCOUNTER — Telehealth: Payer: Self-pay | Admitting: Family Medicine

## 2012-12-26 DIAGNOSIS — Z0000 Encounter for general adult medical examination without abnormal findings: Secondary | ICD-10-CM

## 2012-12-26 NOTE — Telephone Encounter (Signed)
Message copied by Judy Pimple on Mon Dec 26, 2012  9:17 PM ------      Message from: Alvina Chou      Created: Thu Dec 22, 2012 11:02 AM      Regarding: Lab orders for Tuesday, 9.9.14       Patient is scheduled for CPX labs, please order future labs, Thanks , Terri       ------

## 2012-12-27 ENCOUNTER — Other Ambulatory Visit (INDEPENDENT_AMBULATORY_CARE_PROVIDER_SITE_OTHER): Payer: Managed Care, Other (non HMO)

## 2012-12-27 DIAGNOSIS — Z87898 Personal history of other specified conditions: Secondary | ICD-10-CM

## 2012-12-27 DIAGNOSIS — Z0000 Encounter for general adult medical examination without abnormal findings: Secondary | ICD-10-CM

## 2012-12-27 DIAGNOSIS — I10 Essential (primary) hypertension: Secondary | ICD-10-CM

## 2012-12-27 LAB — CBC WITH DIFFERENTIAL/PLATELET
Eosinophils Absolute: 0.2 10*3/uL (ref 0.0–0.7)
Eosinophils Relative: 3.1 % (ref 0.0–5.0)
Lymphocytes Relative: 32.1 % (ref 12.0–46.0)
MCHC: 32.9 g/dL (ref 30.0–36.0)
MCV: 93.3 fl (ref 78.0–100.0)
Monocytes Absolute: 0.5 10*3/uL (ref 0.1–1.0)
Neutrophils Relative %: 54.5 % (ref 43.0–77.0)
Platelets: 251 10*3/uL (ref 150.0–400.0)
WBC: 5.4 10*3/uL (ref 4.5–10.5)

## 2012-12-27 LAB — COMPREHENSIVE METABOLIC PANEL
ALT: 15 U/L (ref 0–35)
AST: 19 U/L (ref 0–37)
Albumin: 4.1 g/dL (ref 3.5–5.2)
Alkaline Phosphatase: 52 U/L (ref 39–117)
Chloride: 108 mEq/L (ref 96–112)
Potassium: 4.5 mEq/L (ref 3.5–5.1)
Sodium: 141 mEq/L (ref 135–145)
Total Protein: 7.1 g/dL (ref 6.0–8.3)

## 2012-12-27 LAB — LIPID PANEL
HDL: 79.5 mg/dL (ref >39.00)
Triglycerides: 57 mg/dL (ref 0.0–149.0)
VLDL: 11.4 mg/dL (ref 0.0–40.0)

## 2013-01-03 ENCOUNTER — Encounter: Payer: Self-pay | Admitting: Family Medicine

## 2013-01-03 ENCOUNTER — Ambulatory Visit (INDEPENDENT_AMBULATORY_CARE_PROVIDER_SITE_OTHER): Payer: Managed Care, Other (non HMO) | Admitting: Family Medicine

## 2013-01-03 VITALS — BP 132/83 | HR 64 | Temp 98.5°F | Ht 61.0 in | Wt 145.2 lb

## 2013-01-03 DIAGNOSIS — Z0000 Encounter for general adult medical examination without abnormal findings: Secondary | ICD-10-CM

## 2013-01-03 DIAGNOSIS — I10 Essential (primary) hypertension: Secondary | ICD-10-CM

## 2013-01-03 MED ORDER — TIZANIDINE HCL 4 MG PO TABS: 4.0000 mg | ORAL_TABLET | Freq: Every evening | ORAL | Status: DC | PRN

## 2013-01-03 NOTE — Patient Instructions (Addendum)
Don't forget to get your flu shot tomorrow Take care of yourself  Think about getting regular exercise

## 2013-01-03 NOTE — Progress Notes (Signed)
Subjective:    Patient ID: Felicia Robbins, female    DOB: 01-Aug-1955, 57 y.o.   MRN: [id number]  HPI Here for health maintenance exam and to review chronic medical problems    Is doing ok overall  Hurt her R wrist again - twisting a lid off  She is trying to minimize trauma to it   Wt is down 1 lb with bmi of 27 Is trying to have healthier habits - was better 3 weeks ago  Smoothies and eating well  Too many salted peanuts lately  No exercise - currently - she knows she needs to start (still belongs to the gym)- took a wt lifting class in June   Flu vaccine - gets it tomorrow at work   [employer] 3/13 with breast bx-- will make her own ref  Mother had breast ca  Self exam -no lumps or changes   Pap 8/12 nl  No gyn problems  Finally went through menopause this year - and is doing (her headaches improved and no period for over a year) Hot flashes come in spurts -tolerating them   Colonosc9/09 nl - pt thinks she has a 10 year recall   Td 2/14  Lipids Lab Results  Component Value Date   CHOL 209* 12/27/2012   CHOL 220* 01/12/2012   CHOL 202* 12/02/2010   Lab Results  Component Value Date   HDL 79.50 12/27/2012   HDL 86.90 01/12/2012   HDL 95.62 12/02/2010   Lab Results  Component Value Date   LDLCALC 98 11/27/2009   LDLCALC 109* 11/29/2007   LDLCALC 105* 09/24/2006   Lab Results  Component Value Date   TRIG 57.0 12/27/2012   TRIG 60.0 01/12/2012   TRIG 47.0 12/02/2010   Lab Results  Component Value Date   CHOLHDL 3 12/27/2012   CHOLHDL 3 01/12/2012   CHOLHDL 2 12/02/2010   Lab Results  Component Value Date   LDLDIRECT 121.7 12/27/2012   LDLDIRECT 116.4 01/12/2012   LDLDIRECT 122.6 12/02/2010    Overall a fairly good profile  She tries to avoid sat fats in general  Patient Active Problem List   Diagnosis Date Noted  . Infected cat scratch of forearm 06/07/2012  . Right hand pain 05/03/2012  . Stress reaction, emotional 01/19/2012  . Trochanteric bursitis of both hips  08/03/2011  . Lump of breast, left 06/05/2011  . Vertigo 12/11/2010  . Gynecologic exam normal 12/08/2010  . Routine general medical examination at a health care facility 11/29/2010  . Hot flashes 08/11/2010  . RHINITIS 08/15/2007  . FIBROCYSTIC BREAST DISEASE 09/24/2006  . HX, PERSONAL, COLONIC POLYPS 09/24/2006  . MIGRAINE, COMMON 08/25/2006  . DECREASED HEARING 08/25/2006  . HYPERTENSION, ESSENTIAL NOS 08/25/2006  . Esophageal Reflux 08/25/2006  . MIGRAINES, HX OF 08/25/2006   Past Medical History  Diagnosis Date  . Depression   . GERD (gastroesophageal reflux disease)   . ZHYQMVHQ(469.6)    Past Surgical History  Procedure Laterality Date  . Cholecystectomy    . Breast surgery      Lt breast nodule - neg.  . Parotid gland tumor excision    . Tubal ligation    . Biopsy bowel      small bowel biopsy- normal (colon plyps / colonoscopy 03/1999)  . Inner ear surgery  2011    Multiple surgeries Lt ear. Hearing device implanted Lt ear 2011.   History  Substance Use Topics  . Smoking status: Former Smoker    Quit date:  04/20/1986  . Smokeless tobacco: Never Used  . Alcohol Use: Yes     Comment: 2-3 drinks a week   Family History  Problem Relation Age of Onset  . Cancer Mother     breast  . Diabetes Mother   . Stroke Mother   . Multiple sclerosis Mother   . Crohn's disease Brother    Allergies  Allergen Reactions  . Tetracycline     REACTION: rash, passed out   Current Outpatient Prescriptions on File Prior to Visit  Medication Sig Dispense Refill  . atenolol (TENORMIN) 50 MG tablet Take 1 tablet (50 mg total) by mouth daily.  30 tablet  11  . tiZANidine (ZANAFLEX) 4 MG tablet Take 1 tablet (4 mg total) by mouth at bedtime as needed. 1/2 - 1 tablet by mouth four times a day as needed for headache.  30 tablet  3   No current facility-administered medications on file prior to visit.     Review of Systems Review of Systems  Constitutional: Negative for fever,  appetite change, fatigue and unexpected weight change.  Eyes: Negative for pain and visual disturbance.  Respiratory: Negative for cough and shortness of breath.   Cardiovascular: Negative for cp or palpitations    Gastrointestinal: Negative for nausea, diarrhea and constipation.  Genitourinary: Negative for urgency and frequency.  Skin: Negative for pallor or rash   MSK pos for R wrist soreness without swelling  Neurological: Negative for weakness, light-headedness, numbness and headaches.  Hematological: Negative for adenopathy. Does not bruise/bleed easily.  Psychiatric/Behavioral: Negative for dysphoric mood. The patient is not nervous/anxious.         Objective:   Physical Exam  Constitutional: She appears well-developed and well-nourished. No distress.  HENT:  Head: Normocephalic and atraumatic.  Right Ear: External ear normal.  Left Ear: External ear normal.  Nose: Nose normal.  Mouth/Throat: Oropharynx is clear and moist.  Eyes: Conjunctivae and EOM are normal. Pupils are equal, round, and reactive to light. Right eye exhibits no discharge. Left eye exhibits no discharge. No scleral icterus.  Neck: Normal range of motion. Neck supple. No JVD present. Carotid bruit is not present. No thyromegaly present.  Cardiovascular: Normal rate, regular rhythm, normal heart sounds and intact distal pulses.  Exam reveals no gallop.   Pulmonary/Chest: Effort normal and breath sounds normal. No respiratory distress. She has no wheezes. She exhibits no tenderness.  Abdominal: Soft. Bowel sounds are normal. She exhibits no distension, no abdominal bruit and no mass. There is no tenderness.  Genitourinary: No breast swelling, tenderness, discharge or bleeding.  Breast exam: No mass, nodules, thickening, tenderness, bulging, retraction, inflamation, nipple discharge or skin changes noted.  No axillary or clavicular LA.    Musculoskeletal: She exhibits no edema and no tenderness.  Lymphadenopathy:     She has no cervical adenopathy.  Neurological: She is alert. She has normal reflexes. No cranial nerve deficit. She exhibits normal muscle tone. Coordination normal.  Skin: Skin is warm and dry. No rash noted. No erythema. No pallor.  Solar lentigos diffusely  Some SKs  Psychiatric: She has a normal mood and affect.          Assessment & Plan:

## 2013-01-05 NOTE — Assessment & Plan Note (Signed)
Reviewed health habits including diet and exercise and skin cancer prevention Also reviewed health mt list, fam hx and immunizations  Wellness labs rev 

## 2013-01-05 NOTE — Assessment & Plan Note (Signed)
bp in fair control at this time  No changes needed  Disc lifstyle change with low sodium diet and exercise  Labs reviewed  

## 2013-01-23 ENCOUNTER — Other Ambulatory Visit: Payer: Self-pay

## 2013-01-23 DIAGNOSIS — Z1231 Encounter for screening mammogram for malignant neoplasm of breast: Secondary | ICD-10-CM

## 2013-02-20 ENCOUNTER — Ambulatory Visit
Admission: RE | Admit: 2013-02-20 | Discharge: 2013-02-20 | Disposition: A | Discharge status: Home / Self Care | Payer: Managed Care, Other (non HMO) | Source: Ambulatory Visit

## 2013-02-20 DIAGNOSIS — Z1231 Encounter for screening mammogram for malignant neoplasm of breast: Secondary | ICD-10-CM

## 2013-02-22 ENCOUNTER — Other Ambulatory Visit: Payer: Self-pay | Admitting: Family Medicine

## 2013-02-22 DIAGNOSIS — R928 Other abnormal and inconclusive findings on diagnostic imaging of breast: Secondary | ICD-10-CM

## 2013-02-27 ENCOUNTER — Ambulatory Visit (INDEPENDENT_AMBULATORY_CARE_PROVIDER_SITE_OTHER): Payer: Managed Care, Other (non HMO) | Admitting: Family Medicine

## 2013-02-27 ENCOUNTER — Encounter: Payer: Self-pay | Admitting: Family Medicine

## 2013-02-27 VITALS — BP 130/78 | HR 90 | Temp 98.5°F | Ht 61.0 in | Wt 151.2 lb

## 2013-02-27 DIAGNOSIS — J069 Acute upper respiratory infection, unspecified: Secondary | ICD-10-CM

## 2013-02-27 DIAGNOSIS — R112 Nausea with vomiting, unspecified: Secondary | ICD-10-CM

## 2013-02-27 DIAGNOSIS — J029 Acute pharyngitis, unspecified: Secondary | ICD-10-CM

## 2013-02-27 LAB — POCT RAPID STREP A (OFFICE): Rapid Strep A Screen: NEGATIVE

## 2013-02-27 MED ORDER — PROMETHAZINE HCL 25 MG PO TABS: 25.0000 mg | ORAL_TABLET | Freq: Three times a day (TID) | ORAL | Status: DC | PRN

## 2013-02-27 NOTE — Assessment & Plan Note (Signed)
Suspect due to viral post nasal drip Phenergan px - prn warned of sedation  Update if not starting to improve in a week or if worsening

## 2013-02-27 NOTE — Assessment & Plan Note (Signed)
With copious post nasal drip and resulting nausea/ st  Disc symptomatic care - see instructions on AVS  Recommend antihistamine/ rest /fluids

## 2013-02-27 NOTE — Progress Notes (Signed)
Subjective:    Patient ID: Felicia Robbins, female    DOB: 02-Dec-1955, 57 y.o.   MRN: [id number]  HPI Here for sore throat  Yesterday- she had a lot of post nasal drip -- then became nauseated and began vomiting  As evening progressed her throat was very sore and ears are also sore  Vomited a few times per day   Some diarrhea also - some loose stool  (has not eaten healthy- ? Cause) No abd pain or cramps   Low grade fever yesterday-was not bad at all   Right now - her throat is very wore and worn out   She was able to drink some OJ today   Patient Active Problem List   Diagnosis Date Noted  . Infected cat scratch of forearm 06/07/2012  . Stress reaction, emotional 01/19/2012  . Trochanteric bursitis of both hips 08/03/2011  . Vertigo 12/11/2010  . Gynecologic exam normal 12/08/2010  . Routine general medical examination at a health care facility 11/29/2010  . Hot flashes 08/11/2010  . RHINITIS 08/15/2007  . FIBROCYSTIC BREAST DISEASE 09/24/2006  . HX, PERSONAL, COLONIC POLYPS 09/24/2006  . MIGRAINE, COMMON 08/25/2006  . DECREASED HEARING 08/25/2006  . HYPERTENSION, ESSENTIAL NOS 08/25/2006  . Esophageal Reflux 08/25/2006  . MIGRAINES, HX OF 08/25/2006   Past Medical History  Diagnosis Date  . Depression   . GERD (gastroesophageal reflux disease)   . WUJWJXBJ(478.2)    Past Surgical History  Procedure Laterality Date  . Cholecystectomy    . Breast surgery      Lt breast nodule - neg.  . Parotid gland tumor excision    . Tubal ligation    . Biopsy bowel      small bowel biopsy- normal (colon plyps / colonoscopy 03/1999)  . Inner ear surgery  2011    Multiple surgeries Lt ear. Hearing device implanted Lt ear 2011.   History  Substance Use Topics  . Smoking status: Former Smoker    Quit date: 04/20/1986  . Smokeless tobacco: Never Used  . Alcohol Use: Yes     Comment: 2-3 drinks a week   Family History  Problem Relation Age of Onset  . Cancer Mother    breast  . Diabetes Mother   . Stroke Mother   . Multiple sclerosis Mother   . Crohn's disease Brother    Allergies  Allergen Reactions  . Tetracycline     REACTION: rash, passed out   Current Outpatient Prescriptions on File Prior to Visit  Medication Sig Dispense Refill  . atenolol (TENORMIN) 50 MG tablet Take 1 tablet (50 mg total) by mouth daily.  30 tablet  11  . tiZANidine (ZANAFLEX) 4 MG tablet Take 1 tablet (4 mg total) by mouth at bedtime as needed. 1/2 - 1 tablet by mouth four times a day as needed for headache.  30 tablet  3   No current facility-administered medications on file prior to visit.      Review of Systems Review of Systems  Constitutional: Negative for fever, appetite change, fatigue and unexpected weight change.  Eyes: Negative for pain and visual disturbance ENt pos for rhinorrhea and post nasal drip/ ear and throat pain .  Respiratory: Negative for cough and shortness of breath.   Cardiovascular: Negative for cp or palpitations    Gastrointestinal: Negative for abd pain or blood in stool  Genitourinary: Negative for urgency and frequency.  Skin: Negative for pallor or rash   Neurological: Negative for weakness,  light-headedness, numbness and headaches.  Hematological: Negative for adenopathy. Does not bruise/bleed easily.  Psychiatric/Behavioral: Negative for dysphoric mood. The patient is not nervous/anxious.         Objective:   Physical Exam  Constitutional: She appears well-developed and well-nourished. No distress.  HENT:  Head: Normocephalic and atraumatic.  Right Ear: External ear normal.  Left Ear: External ear normal.  Mouth/Throat: Oropharynx is clear and moist.  Nares are injected and congested  Much clear post nasal drip noted RST neg No throat erythema  Eyes: Conjunctivae and EOM are normal. Pupils are equal, round, and reactive to light. Right eye exhibits no discharge. Left eye exhibits no discharge. No scleral icterus.  Neck:  Normal range of motion. Neck supple. No JVD present. No thyromegaly present.  Cardiovascular: Normal rate, regular rhythm, normal heart sounds and intact distal pulses.  Exam reveals no gallop.   Pulmonary/Chest: Effort normal and breath sounds normal. No respiratory distress. She has no wheezes. She has no rales.  Abdominal: Soft. Bowel sounds are normal. She exhibits no distension and no mass. There is no tenderness.  Lymphadenopathy:    She has no cervical adenopathy.  Neurological: She is alert. She has normal reflexes.  Skin: Skin is warm and dry. No rash noted.  Psychiatric: She has a normal mood and affect.          Assessment & Plan:

## 2013-02-27 NOTE — Progress Notes (Signed)
Pre-visit discussion using our clinic review tool. No additional management support is needed unless otherwise documented below in the visit note.  

## 2013-02-27 NOTE — Patient Instructions (Signed)
I think you have a viral upper respiratory infection and the post nasal drip is causing the nausa Try zyrtec 10mg  daily otc to control drip and sneezing  Try to get in sips of fluids through the day Phenergan for nausea as needed-watch out for sedation Tylenol for sore throat  Stay out of work today and tomorrow- and get rest Update if not starting to improve in a week or if worsening

## 2013-03-14 ENCOUNTER — Ambulatory Visit
Admission: RE | Admit: 2013-03-14 | Discharge: 2013-03-14 | Disposition: A | Discharge status: Home / Self Care | Payer: Managed Care, Other (non HMO) | Source: Ambulatory Visit | Attending: Family Medicine | Admitting: Family Medicine

## 2013-03-14 ENCOUNTER — Other Ambulatory Visit: Payer: Self-pay | Admitting: Family Medicine

## 2013-03-14 DIAGNOSIS — R928 Other abnormal and inconclusive findings on diagnostic imaging of breast: Secondary | ICD-10-CM

## 2013-03-14 DIAGNOSIS — N6320 Unspecified lump in the left breast, unspecified quadrant: Secondary | ICD-10-CM

## 2013-03-15 ENCOUNTER — Ambulatory Visit
Admission: RE | Admit: 2013-03-15 | Discharge: 2013-03-15 | Disposition: A | Discharge status: Home / Self Care | Payer: Managed Care, Other (non HMO) | Source: Ambulatory Visit | Attending: Family Medicine | Admitting: Family Medicine

## 2013-03-15 ENCOUNTER — Other Ambulatory Visit: Payer: Self-pay | Admitting: Family Medicine

## 2013-03-15 DIAGNOSIS — N6320 Unspecified lump in the left breast, unspecified quadrant: Secondary | ICD-10-CM

## 2013-03-20 ENCOUNTER — Ambulatory Visit
Admission: RE | Admit: 2013-03-20 | Discharge: 2013-03-20 | Disposition: A | Discharge status: Home / Self Care | Payer: Managed Care, Other (non HMO) | Source: Ambulatory Visit | Attending: Family Medicine | Admitting: Family Medicine

## 2013-03-20 DIAGNOSIS — N6320 Unspecified lump in the left breast, unspecified quadrant: Secondary | ICD-10-CM

## 2013-03-28 ENCOUNTER — Ambulatory Visit (INDEPENDENT_AMBULATORY_CARE_PROVIDER_SITE_OTHER): Payer: Managed Care, Other (non HMO) | Admitting: Surgery

## 2013-03-28 ENCOUNTER — Encounter (INDEPENDENT_AMBULATORY_CARE_PROVIDER_SITE_OTHER): Payer: Self-pay | Admitting: Surgery

## 2013-03-28 VITALS — BP 122/70 | HR 83 | Temp 98.0°F | Resp 18 | Ht 61.0 in | Wt 154.0 lb

## 2013-03-28 DIAGNOSIS — N6320 Unspecified lump in the left breast, unspecified quadrant: Secondary | ICD-10-CM

## 2013-03-28 DIAGNOSIS — N63 Unspecified lump in unspecified breast: Secondary | ICD-10-CM

## 2013-03-28 NOTE — Progress Notes (Signed)
Patient ID: Felicia Robbins, female   DOB: 03-24-1956, 57 y.o.   MRN: [id number]  Chief Complaint  Patient presents with  . New Evaluation    left breast     HPI Felicia Robbins is a 57 y.o. female.  Patient presents at the request of Dr.Yacobozzi do to discordance left breast mammogram after core biopsy a lesion in left breast upper outer quadrant. Patient has a history of dense breast tissue. She is a previous core biopsies of her breast on the past. Family history of breast cancer noted. Denies any change in the appearance of her breasts or nipple discharge or new mass. Core biopsy of the area in question in her left breast was done with clip placement. Pathology shows benign breast parenchyma with fibrocystic change and microcalcification of fat necrosis. No atypia was identified. This was felt to the discordance. HPI  Past Medical History  Diagnosis Date  . Depression   . GERD (gastroesophageal reflux disease)   . WUJWJXBJ(478.2)     Past Surgical History  Procedure Laterality Date  . Cholecystectomy    . Breast surgery      Lt breast nodule - neg.  . Parotid gland tumor excision    . Tubal ligation    . Biopsy bowel      small bowel biopsy- normal (colon plyps / colonoscopy 03/1999)  . Inner ear surgery  2011    Multiple surgeries Lt ear. Hearing device implanted Lt ear 2011.    Family History  Problem Relation Age of Onset  . Cancer Mother     breast  . Diabetes Mother   . Stroke Mother   . Multiple sclerosis Mother   . Crohn's disease Brother     Social History History  Substance Use Topics  . Smoking status: Former Smoker    Quit date: 04/20/1986  . Smokeless tobacco: Never Used  . Alcohol Use: Yes     Comment: 2-3 drinks a week    Allergies  Allergen Reactions  . Tetracycline     REACTION: rash, passed out    Current Outpatient Prescriptions  Medication Sig Dispense Refill  . atenolol (TENORMIN) 50 MG tablet Take 1 tablet (50 mg total) by mouth  daily.  30 tablet  11  . promethazine (PHENERGAN) 25 MG tablet Take 1 tablet (25 mg total) by mouth every 8 (eight) hours as needed for nausea or vomiting.  20 tablet  0  . tiZANidine (ZANAFLEX) 4 MG tablet Take 1 tablet (4 mg total) by mouth at bedtime as needed. 1/2 - 1 tablet by mouth four times a day as needed for headache.  30 tablet  3   No current facility-administered medications for this visit.    Review of Systems Review of Systems  Constitutional: Negative for fever, chills and unexpected weight change.  HENT: Negative for congestion, hearing loss, sore throat, trouble swallowing and voice change.   Eyes: Negative for visual disturbance.  Respiratory: Negative for cough and wheezing.   Cardiovascular: Negative for chest pain, palpitations and leg swelling.  Gastrointestinal: Negative for nausea, vomiting, abdominal pain, diarrhea, constipation, blood in stool, abdominal distention and anal bleeding.  Genitourinary: Negative for hematuria, vaginal bleeding and difficulty urinating.  Musculoskeletal: Negative for arthralgias.  Skin: Negative for rash and wound.  Neurological: Negative for seizures, syncope and headaches.  Hematological: Negative for adenopathy. Does not bruise/bleed easily.  Psychiatric/Behavioral: Negative for confusion.    Blood pressure 122/70, pulse 83, temperature 98 F (36.7 C), resp.  rate 18, height 5\' 1"  (1.549 m), weight 154 lb (69.854 kg), last menstrual period 11/07/2010.  Physical Exam Physical Exam  Constitutional: She is oriented to person, place, and time. She appears well-developed and well-nourished.  HENT:  Head: Normocephalic and atraumatic.  Eyes: Pupils are equal, round, and reactive to light. No scleral icterus.  Neck: Normal range of motion. Neck supple.  Cardiovascular: Normal rate and regular rhythm.   Pulmonary/Chest: Effort normal and breath sounds normal. Right breast exhibits no inverted nipple, no mass, no nipple discharge, no  skin change and no tenderness. Left breast exhibits no inverted nipple, no mass, no nipple discharge, no skin change and no tenderness. Breasts are symmetrical.    Musculoskeletal: Normal range of motion.  Neurological: She is alert and oriented to person, place, and time.  Skin: Skin is warm and dry.  Psychiatric: She has a normal mood and affect. Her behavior is normal. Judgment and thought content normal.    Data Reviewed  CLINICAL DATA: Abnormal screening mammogram.  EXAM:  DIGITAL DIAGNOSTIC LEFT MAMMOGRAM  ULTRASOUND LEFT BREAST  COMPARISON: Multiple priors  ACR Breast Density Category b: There are scattered areas of  fibroglandular density.  FINDINGS:  Architectural distortion in the upper-outer left breast is better  depicted on screening tomosynthesis images. No associated discrete  mass is identified mammographically.  On physical exam of the upper-outer left breast, no palpable  abnormality is identified.  Targeted ultrasound demonstrates an irregular 5 x 4 x 5 mm  hypoechoic to isoechoic mass at 2 o'clock, 5 cm from the nipple.  This likely corresponds with the mammographic abnormality. There is  associated posterior acoustic shadowing. Internal vascularity is  demonstrated. No left axillary adenopathy is identified.  IMPRESSION:  Left breast mass with associated architectural distortion.  RECOMMENDATION:  Ultrasound-guided left breast biopsy. This has been scheduled for  03/14/2013 at 2:30 p.m.  I have discussed the findings and recommendations with the patient.  Results were also provided in writing at the conclusion of the  visit. If applicable, a reminder letter will be sent to the patient  regarding the next appointment.  BI-RADS CATEGORY 4: Suspicious abnormality - biopsy should be  considered.  Electronically Signed  By: Jerene Dilling M.D.  On: 03/14/2013 16:15   Path  Benign breast parenchyma and fat necrosis Assessment    Discordant mammogram  left breast    Plan    Discussed excision versus observation. Risks, benefits and alternative discussed. The patient would like to proceed with left breast needle localized excisional biopsy.The procedure has been discussed with the patient. Alternatives to surgery have been discussed with the patient.  Risks of surgery include bleeding,  Infection,  Seroma formation, death,  and the need for further surgery.   The patient understands and wishes to proceed.       Felicia Christine A. 03/28/2013, 3:23 PM

## 2013-03-28 NOTE — Patient Instructions (Signed)
Lumpectomy A lumpectomy is a form of "breast conserving" or "breast preservation" surgery. It may also be referred to as a partial mastectomy. During a lumpectomy, the portion of the breast that contains the cancerous tumor or breast mass (the lump) is removed. Some normal tissue around the lump may also be removed to make sure all the tumor has been removed. This surgery should take 40 minutes or less. LET YOUR HEALTH CARE PROVIDER KNOW ABOUT:  Any allergies you have.  All medicines you are taking, including vitamins, herbs, eye drops, creams, and over-the-counter medicines.  Previous problems you or members of your family have had with the use of anesthetics.  Any blood disorders you have.  Previous surgeries you have had.  Medical conditions you have. RISKS AND COMPLICATIONS Generally, this is a safe procedure. However, as with any procedure, complications can occur. Possible complications include:  Bleeding.  Infection.  Pain.  Temporary swelling.  Change in the shape of the breast, particularly if a large portion is removed. BEFORE THE PROCEDURE  Ask your health care provider about changing or stopping your regular medicines.  Do not eat or drink anything for 7 8 hours before the surgery or as directed by your health care provider. Ask your health care provider if you can take a sip of water with any approved medicines.  On the day of surgery, your healthcare provider will use a mammogram or ultrasound to locate and mark the tumor in your breast. These markings on your breast will show where the cut (incision) will be made. PROCEDURE   An IV tube will be put into one of your veins.  You may be given medicine to help you relax before the surgery (sedative). You will be given one of the following:  A medicine that numbs the area (local anesthesia).  A medicine that makes you go to sleep (general anesthesia).  Your health care provider will use a kind of electric scalpel  that uses heat to minimize bleeding (electrocautery knife).  A curved incision (like a smile or frown) that follows the natural curve of your breast is made, to allow for minimal scarring and better healing.  The tumor will be removed with some of the surrounding tissue. This will be sent to the lab for analysis. Your health care provider may also remove your lymph nodes at this time if needed.  Sometimes, but not always, a rubber tube called a drain will be surgically inserted into your breast area or armpit to collect excess fluid that may accumulate in the space where the tumor was. This drain is connected to a plastic bulb on the outside of your body. This drain creates suction to help remove the fluid.  The incisions will be closed with stitches (sutures).  A bandage may be placed over the incisions. AFTER THE PROCEDURE  You will be taken to the recovery area.  You will be given medicine for pain.  A small rubber drain may be placed in the breast for 2 3 days to prevent a collection of blood (hematoma) from developing in the breast. You will be given instructions on caring for the drain before you go home.  A pressure bandage (dressing) will be applied for 1 2 days to prevent bleeding. Ask your health care provider how to care for your bandage at home. Document Released: 05/18/2006 Document Revised: 12/07/2012 Document Reviewed: 09/09/2012 Exit[employer] Patient Information 2014 [employer].  

## 2013-05-10 ENCOUNTER — Ambulatory Visit (INDEPENDENT_AMBULATORY_CARE_PROVIDER_SITE_OTHER): Payer: Managed Care, Other (non HMO) | Admitting: Family Medicine

## 2013-05-10 ENCOUNTER — Encounter: Payer: Self-pay | Admitting: Family Medicine

## 2013-05-10 VITALS — BP 140/90 | HR 82 | Temp 98.4°F | Ht 61.0 in | Wt 156.8 lb

## 2013-05-10 DIAGNOSIS — J029 Acute pharyngitis, unspecified: Secondary | ICD-10-CM

## 2013-05-10 LAB — POCT RAPID STREP A (OFFICE): Rapid Strep A Screen: NEGATIVE

## 2013-05-10 MED ORDER — AMOXICILLIN 500 MG PO CAPS: 500.0000 mg | ORAL_CAPSULE | Freq: Three times a day (TID) | ORAL | Status: DC

## 2013-05-10 NOTE — Progress Notes (Signed)
Subjective:    Patient ID: Felicia Robbins, female    DOB: 01/13/1956, 58 y.o.   MRN: [id number]  HPI Here with a ST  Feels like it is on fire  Horrible headache  Not a lot of cold symptoms   No cough   No strep contacts   Yesterday felt a little achey and chilled -not severe  Chills on and off today    RST neg   Patient Active Problem List   Diagnosis Date Noted  . Left breast mass 03/28/2013  . Viral URI 02/27/2013  . Nausea with vomiting 02/27/2013  . Infected cat scratch of forearm 06/07/2012  . Stress reaction, emotional 01/19/2012  . Trochanteric bursitis of both hips 08/03/2011  . Vertigo 12/11/2010  . Gynecologic exam normal 12/08/2010  . Routine general medical examination at a health care facility 11/29/2010  . Hot flashes 08/11/2010  . RHINITIS 08/15/2007  . FIBROCYSTIC BREAST DISEASE 09/24/2006  . HX, PERSONAL, COLONIC POLYPS 09/24/2006  . MIGRAINE, COMMON 08/25/2006  . DECREASED HEARING 08/25/2006  . HYPERTENSION, ESSENTIAL NOS 08/25/2006  . Esophageal Reflux 08/25/2006  . MIGRAINES, HX OF 08/25/2006   Past Medical History  Diagnosis Date  . Depression   . GERD (gastroesophageal reflux disease)   . WUJWJXBJ(478.2)    Past Surgical History  Procedure Laterality Date  . Cholecystectomy    . Breast surgery      Lt breast nodule - neg.  . Parotid gland tumor excision    . Tubal ligation    . Biopsy bowel      small bowel biopsy- normal (colon plyps / colonoscopy 03/1999)  . Inner ear surgery  2011    Multiple surgeries Lt ear. Hearing device implanted Lt ear 2011.   History  Substance Use Topics  . Smoking status: Former Smoker    Quit date: 04/20/1986  . Smokeless tobacco: Never Used  . Alcohol Use: Yes     Comment: 2-3 drinks a week   Family History  Problem Relation Age of Onset  . Cancer Mother     breast  . Diabetes Mother   . Stroke Mother   . Multiple sclerosis Mother   . Crohn's disease Brother    Allergies  Allergen  Reactions  . Tetracycline     REACTION: rash, passed out   Current Outpatient Prescriptions on File Prior to Visit  Medication Sig Dispense Refill  . tiZANidine (ZANAFLEX) 4 MG tablet Take 1 tablet (4 mg total) by mouth at bedtime as needed. 1/2 - 1 tablet by mouth four times a day as needed for headache.  30 tablet  3  . atenolol (TENORMIN) 50 MG tablet Take 1 tablet (50 mg total) by mouth daily.  30 tablet  11   No current facility-administered medications on file prior to visit.    Review of Systems Review of Systems  Constitutional: Negative for fever, appetite change, and unexpected weight change. pos for fatigue and malaise  Eyes: Negative for pain and visual disturbance.  Respiratory: Negative for cough and shortness of breath.   Cardiovascular: Negative for cp or palpitations    Gastrointestinal: Negative for nausea, diarrhea and constipation.  Genitourinary: Negative for urgency and frequency.  Skin: Negative for pallor or rash   Neurological: Negative for weakness, light-headedness, numbness and headaches.  Hematological: Negative for adenopathy. Does not bruise/bleed easily.  Psychiatric/Behavioral: Negative for dysphoric mood. The patient is not nervous/anxious.         Objective:   Physical Exam  Constitutional: She appears well-developed and well-nourished. No distress.  HENT:  Head: Normocephalic.  Right Ear: External ear normal.  Mouth/Throat: No oropharyngeal exudate.  Boggy nares Diffuse throat erythema without exudate or swelling  No sinus tenderness  L ear baseline abn TM  Eyes: Conjunctivae and EOM are normal. Pupils are equal, round, and reactive to light. Right eye exhibits no discharge. Left eye exhibits no discharge.  Neck: Normal range of motion. Neck supple.  Cardiovascular: Normal rate and regular rhythm.   Pulmonary/Chest: Effort normal and breath sounds normal. No respiratory distress. She has no wheezes. She has no rales.  Lymphadenopathy:     She has no cervical adenopathy.  Neurological: She is alert. She has normal reflexes.  Skin: Skin is warm and dry. No rash noted.  Psychiatric: She has a normal mood and affect.          Assessment & Plan:

## 2013-05-10 NOTE — Progress Notes (Signed)
Pre-visit discussion using our clinic review tool. No additional management support is needed unless otherwise documented below in the visit note.  

## 2013-05-10 NOTE — Patient Instructions (Signed)
You have a  Throat infection  Sent a throat culture - will update you when that returns Start amoxicillin as directed  Drink fluids Gargle with salt water  Ibuprofen otc as directed with food for pain and fever    Get rest

## 2013-05-11 NOTE — Assessment & Plan Note (Signed)
Throat cx sent , rst neg but symptoms are consistent with strep Cover with amox Disc symptomatic care - see instructions on AVS Update if not starting to improve in a week or if worsening

## 2013-05-12 LAB — CULTURE, GROUP A STREP: ORGANISM ID, BACTERIA: NORMAL

## 2013-07-03 ENCOUNTER — Other Ambulatory Visit (INDEPENDENT_AMBULATORY_CARE_PROVIDER_SITE_OTHER): Payer: Self-pay | Admitting: Surgery

## 2013-07-03 DIAGNOSIS — N6320 Unspecified lump in the left breast, unspecified quadrant: Secondary | ICD-10-CM

## 2013-07-07 ENCOUNTER — Encounter (HOSPITAL_BASED_OUTPATIENT_CLINIC_OR_DEPARTMENT_OTHER): Payer: Self-pay | Admitting: *Deleted

## 2013-07-07 NOTE — Progress Notes (Signed)
No meds-no labs ordered

## 2013-07-13 ENCOUNTER — Ambulatory Visit (HOSPITAL_BASED_OUTPATIENT_CLINIC_OR_DEPARTMENT_OTHER): Payer: Managed Care, Other (non HMO) | Admitting: Anesthesiology

## 2013-07-13 ENCOUNTER — Encounter (HOSPITAL_BASED_OUTPATIENT_CLINIC_OR_DEPARTMENT_OTHER): Payer: Self-pay | Admitting: Anesthesiology

## 2013-07-13 ENCOUNTER — Encounter (HOSPITAL_BASED_OUTPATIENT_CLINIC_OR_DEPARTMENT_OTHER)
Admission: RE | Disposition: A | Discharge status: Home / Self Care | Payer: Self-pay | Source: Ambulatory Visit | Attending: Surgery

## 2013-07-13 ENCOUNTER — Ambulatory Visit
Admission: RE | Admit: 2013-07-13 | Discharge: 2013-07-13 | Disposition: A | Discharge status: Home / Self Care | Payer: Managed Care, Other (non HMO) | Source: Ambulatory Visit | Attending: Surgery | Admitting: Surgery

## 2013-07-13 ENCOUNTER — Encounter (HOSPITAL_BASED_OUTPATIENT_CLINIC_OR_DEPARTMENT_OTHER): Payer: Managed Care, Other (non HMO) | Admitting: Anesthesiology

## 2013-07-13 ENCOUNTER — Ambulatory Visit (HOSPITAL_BASED_OUTPATIENT_CLINIC_OR_DEPARTMENT_OTHER)
Admission: RE | Admit: 2013-07-13 | Discharge: 2013-07-13 | Disposition: A | Discharge status: Home / Self Care | Payer: Managed Care, Other (non HMO) | Source: Ambulatory Visit | Attending: Surgery | Admitting: Surgery

## 2013-07-13 DIAGNOSIS — G43009 Migraine without aura, not intractable, without status migrainosus: Secondary | ICD-10-CM | POA: Insufficient documentation

## 2013-07-13 DIAGNOSIS — Z860100 Personal history of colon polyps, unspecified: Secondary | ICD-10-CM | POA: Insufficient documentation

## 2013-07-13 DIAGNOSIS — N6320 Unspecified lump in the left breast, unspecified quadrant: Secondary | ICD-10-CM

## 2013-07-13 DIAGNOSIS — K219 Gastro-esophageal reflux disease without esophagitis: Secondary | ICD-10-CM | POA: Insufficient documentation

## 2013-07-13 DIAGNOSIS — F3289 Other specified depressive episodes: Secondary | ICD-10-CM | POA: Insufficient documentation

## 2013-07-13 DIAGNOSIS — R928 Other abnormal and inconclusive findings on diagnostic imaging of breast: Secondary | ICD-10-CM | POA: Insufficient documentation

## 2013-07-13 DIAGNOSIS — Z87891 Personal history of nicotine dependence: Secondary | ICD-10-CM | POA: Insufficient documentation

## 2013-07-13 DIAGNOSIS — F329 Major depressive disorder, single episode, unspecified: Secondary | ICD-10-CM | POA: Insufficient documentation

## 2013-07-13 DIAGNOSIS — I10 Essential (primary) hypertension: Secondary | ICD-10-CM | POA: Insufficient documentation

## 2013-07-13 DIAGNOSIS — M76899 Other specified enthesopathies of unspecified lower limb, excluding foot: Secondary | ICD-10-CM | POA: Insufficient documentation

## 2013-07-13 DIAGNOSIS — R920 Mammographic microcalcification found on diagnostic imaging of breast: Secondary | ICD-10-CM

## 2013-07-13 HISTORY — PX: BREAST BIOPSY: SHX20

## 2013-07-13 HISTORY — DX: Pre-excitation syndrome: I45.6

## 2013-07-13 HISTORY — DX: Unspecified hearing loss, unspecified ear: H91.90

## 2013-07-13 LAB — POCT HEMOGLOBIN-HEMACUE: HEMOGLOBIN: 14 g/dL (ref 12.0–15.0)

## 2013-07-13 SURGERY — BREAST BIOPSY WITH NEEDLE LOCALIZATION
Anesthesia: General | Site: Breast | Laterality: Left

## 2013-07-13 MED ORDER — BUPIVACAINE-EPINEPHRINE 0.25% -1:200000 IJ SOLN
INTRAMUSCULAR | Status: DC | PRN
  Administered 2013-07-13: 10 mL

## 2013-07-13 MED ORDER — FENTANYL CITRATE 0.05 MG/ML IJ SOLN
INTRAMUSCULAR | Status: AC
  Filled 2013-07-13: qty 4

## 2013-07-13 MED ORDER — HYDROMORPHONE HCL PF 1 MG/ML IJ SOLN
INTRAMUSCULAR | Status: AC
  Filled 2013-07-13: qty 1

## 2013-07-13 MED ORDER — METOCLOPRAMIDE HCL 5 MG/ML IJ SOLN: 10.0000 mg | Freq: Once | INTRAMUSCULAR | Status: DC | PRN

## 2013-07-13 MED ORDER — BUPIVACAINE-EPINEPHRINE PF 0.25-1:200000 % IJ SOLN
INTRAMUSCULAR | Status: AC
  Filled 2013-07-13: qty 30

## 2013-07-13 MED ORDER — PROPOFOL 10 MG/ML IV BOLUS
INTRAVENOUS | Status: AC
  Filled 2013-07-13: qty 20

## 2013-07-13 MED ORDER — MIDAZOLAM HCL 5 MG/5ML IJ SOLN
INTRAMUSCULAR | Status: DC | PRN
  Administered 2013-07-13: 2 mg via INTRAVENOUS

## 2013-07-13 MED ORDER — MIDAZOLAM HCL 2 MG/2ML IJ SOLN
INTRAMUSCULAR | Status: AC
  Filled 2013-07-13: qty 2

## 2013-07-13 MED ORDER — OXYCODONE HCL 5 MG PO TABS: 5.0000 mg | ORAL_TABLET | Freq: Once | ORAL | Status: DC | PRN

## 2013-07-13 MED ORDER — ONDANSETRON HCL 4 MG/2ML IJ SOLN
INTRAMUSCULAR | Status: DC | PRN
  Administered 2013-07-13: 4 mg via INTRAVENOUS

## 2013-07-13 MED ORDER — DEXAMETHASONE SODIUM PHOSPHATE 4 MG/ML IJ SOLN
INTRAMUSCULAR | Status: DC | PRN
  Administered 2013-07-13: 10 mg via INTRAVENOUS

## 2013-07-13 MED ORDER — FENTANYL CITRATE 0.05 MG/ML IJ SOLN: 50.0000 ug | INTRAMUSCULAR | Status: DC | PRN

## 2013-07-13 MED ORDER — PROPOFOL 10 MG/ML IV BOLUS
INTRAVENOUS | Status: DC | PRN
  Administered 2013-07-13: 160 mg via INTRAVENOUS

## 2013-07-13 MED ORDER — CEFAZOLIN SODIUM-DEXTROSE 2-3 GM-% IV SOLR
INTRAVENOUS | Status: AC
  Filled 2013-07-13: qty 50

## 2013-07-13 MED ORDER — LACTATED RINGERS IV SOLN
INTRAVENOUS | Status: DC
  Administered 2013-07-13 (×2): via INTRAVENOUS

## 2013-07-13 MED ORDER — HYDROMORPHONE HCL PF 1 MG/ML IJ SOLN
0.2500 mg | INTRAMUSCULAR | Status: DC | PRN
  Administered 2013-07-13 (×2): 0.5 mg via INTRAVENOUS

## 2013-07-13 MED ORDER — MIDAZOLAM HCL 2 MG/2ML IJ SOLN: 1.0000 mg | INTRAMUSCULAR | Status: DC | PRN

## 2013-07-13 MED ORDER — FENTANYL CITRATE 0.05 MG/ML IJ SOLN
INTRAMUSCULAR | Status: DC | PRN
  Administered 2013-07-13 (×2): 50 ug via INTRAVENOUS
  Administered 2013-07-13 (×2): 25 ug via INTRAVENOUS

## 2013-07-13 MED ORDER — EPHEDRINE SULFATE 50 MG/ML IJ SOLN
INTRAMUSCULAR | Status: DC | PRN
  Administered 2013-07-13 (×2): 10 mg via INTRAVENOUS

## 2013-07-13 MED ORDER — CEFAZOLIN SODIUM-DEXTROSE 2-3 GM-% IV SOLR
2.0000 g | INTRAVENOUS | Status: AC
  Administered 2013-07-13: 2 g via INTRAVENOUS

## 2013-07-13 MED ORDER — HYDROCODONE-IBUPROFEN 5-200 MG PO TABS: 1.0000 | ORAL_TABLET | Freq: Three times a day (TID) | ORAL | Status: DC | PRN

## 2013-07-13 MED ORDER — OXYCODONE HCL 5 MG/5ML PO SOLN: 5.0000 mg | Freq: Once | ORAL | Status: DC | PRN

## 2013-07-13 SURGICAL SUPPLY — 51 items
ADH SKN CLS APL DERMABOND .7 (GAUZE/BANDAGES/DRESSINGS) ×1
APPLIER CLIP 9.375 MED OPEN (MISCELLANEOUS)
APR CLP MED 9.3 20 MLT OPN (MISCELLANEOUS)
BINDER BREAST LRG (GAUZE/BANDAGES/DRESSINGS) ×2 IMPLANT
BINDER BREAST MEDIUM (GAUZE/BANDAGES/DRESSINGS) IMPLANT
BINDER BREAST XLRG (GAUZE/BANDAGES/DRESSINGS) IMPLANT
BINDER BREAST XXLRG (GAUZE/BANDAGES/DRESSINGS) IMPLANT
BLADE SURG 15 STRL LF DISP TIS (BLADE) ×1 IMPLANT
BLADE SURG 15 STRL SS (BLADE) ×3
CANISTER SUCT 1200ML W/VALVE (MISCELLANEOUS) ×3 IMPLANT
CHLORAPREP W/TINT 26ML (MISCELLANEOUS) ×3 IMPLANT
CLIP APPLIE 9.375 MED OPEN (MISCELLANEOUS) IMPLANT
CLIP TI WIDE RED SMALL 6 (CLIP) IMPLANT
COVER MAYO STAND STRL (DRAPES) ×3 IMPLANT
COVER TABLE BACK 60X90 (DRAPES) ×3 IMPLANT
DECANTER SPIKE VIAL GLASS SM (MISCELLANEOUS) ×3 IMPLANT
DERMABOND ADVANCED (GAUZE/BANDAGES/DRESSINGS) ×2
DERMABOND ADVANCED .7 DNX12 (GAUZE/BANDAGES/DRESSINGS) ×1 IMPLANT
DEVICE DUBIN W/COMP PLATE 8390 (MISCELLANEOUS) ×2 IMPLANT
DRAPE LAPAROSCOPIC ABDOMINAL (DRAPES) IMPLANT
DRAPE PED LAPAROTOMY (DRAPES) ×3 IMPLANT
DRAPE UTILITY XL STRL (DRAPES) ×3 IMPLANT
ELECT COATED BLADE 2.86 ST (ELECTRODE) ×3 IMPLANT
ELECT REM PT RETURN 9FT ADLT (ELECTROSURGICAL) ×3
ELECTRODE REM PT RTRN 9FT ADLT (ELECTROSURGICAL) ×1 IMPLANT
GLOVE BIO SURGEON STRL SZ7 (GLOVE) ×2 IMPLANT
GLOVE BIOGEL PI IND STRL 8 (GLOVE) ×1 IMPLANT
GLOVE BIOGEL PI INDICATOR 8 (GLOVE) ×2
GLOVE ECLIPSE 6.5 STRL STRAW (GLOVE) ×2 IMPLANT
GLOVE ECLIPSE 8.0 STRL XLNG CF (GLOVE) ×3 IMPLANT
GOWN STRL REUS W/ TWL LRG LVL3 (GOWN DISPOSABLE) ×2 IMPLANT
GOWN STRL REUS W/TWL LRG LVL3 (GOWN DISPOSABLE) ×6
KIT MARKER MARGIN INK (KITS) IMPLANT
NDL HYPO 25X1 1.5 SAFETY (NEEDLE) ×1 IMPLANT
NEEDLE HYPO 25X1 1.5 SAFETY (NEEDLE) ×3 IMPLANT
NS IRRIG 1000ML POUR BTL (IV SOLUTION) ×3 IMPLANT
PACK BASIN DAY SURGERY FS (CUSTOM PROCEDURE TRAY) ×3 IMPLANT
PENCIL BUTTON HOLSTER BLD 10FT (ELECTRODE) ×3 IMPLANT
SLEEVE SCD COMPRESS KNEE MED (MISCELLANEOUS) ×3 IMPLANT
SPONGE LAP 4X18 X RAY DECT (DISPOSABLE) ×3 IMPLANT
STAPLER VISISTAT 35W (STAPLE) IMPLANT
SUT MON AB 4-0 PC3 18 (SUTURE) ×3 IMPLANT
SUT SILK 2 0 SH (SUTURE) IMPLANT
SUT VIC AB 3-0 SH 27 (SUTURE) ×3
SUT VIC AB 3-0 SH 27X BRD (SUTURE) ×1 IMPLANT
SYR CONTROL 10ML LL (SYRINGE) ×3 IMPLANT
TOWEL OR 17X24 6PK STRL BLUE (TOWEL DISPOSABLE) ×6 IMPLANT
TOWEL OR NON WOVEN STRL DISP B (DISPOSABLE) ×3 IMPLANT
TUBE CONNECTING 20'X1/4 (TUBING) ×1
TUBE CONNECTING 20X1/4 (TUBING) ×2 IMPLANT
YANKAUER SUCT BULB TIP NO VENT (SUCTIONS) ×3 IMPLANT

## 2013-07-13 NOTE — Anesthesia Postprocedure Evaluation (Signed)
Anesthesia Post Note  Patient: Felicia Robbins  Procedure(s) Performed: Procedure(s) (LRB): BREAST BIOPSY WITH NEEDLE LOCALIZATION (Left)  Anesthesia type: General  Patient location: PACU  Post pain: Pain level controlled  Post assessment: Patient's Cardiovascular Status Stable  Last Vitals:  Filed Vitals:   07/13/13 1430  BP: 122/64  Pulse: 83  Temp:   Resp: 17    Post vital signs: Reviewed and stable  Level of consciousness: alert  Complications: No apparent anesthesia complications EKG obtain to document WPW for future encounters.

## 2013-07-13 NOTE — Anesthesia Procedure Notes (Signed)
Procedure Name: LMA Insertion Date/Time: 07/13/2013 1:02 PM Performed by: Lyndee Leo Pre-anesthesia Checklist: Patient identified, Emergency Drugs available, Suction available and Patient being monitored Patient Re-evaluated:Patient Re-evaluated prior to inductionOxygen Delivery Method: Circle System Utilized Preoxygenation: Pre-oxygenation with 100% oxygen Intubation Type: IV induction Ventilation: Mask ventilation without difficulty LMA: LMA inserted LMA Size: 4.0 Number of attempts: 1 Airway Equipment and Method: bite block Placement Confirmation: positive ETCO2 Tube secured with: Tape Dental Injury: Teeth and Oropharynx as per pre-operative assessment

## 2013-07-13 NOTE — H&P (Signed)
Transplants    None    Demographics Felicia Robbins 58 year old female  [place]:   Felicia Robbins [id number] [phone] (W) Works at [employer] [[employer]  Problem ListUnprioritized  MIGRAINE, Felicia Robbins  HYPERTENSION, ESSENTIAL NOS  RHINITIS  Esophageal Reflux  FIBROCYSTIC BREAST DISEASE  HX, PERSONAL, COLONIC POLYPS  MIGRAINES, HX OF  Hot flashes  Routine general medical examination at a health care facility  Gynecologic exam normal  Vertigo  Trochanteric bursitis of both hips  Stress reaction, emotional  Infected cat scratch of forearm  Viral URI  Nausea with vomiting  Left breast mass  Acute pharyngitis  Significant History/Details  Smoking: Former Smoker (Quit Date:04/20/1986)  Smokeless Tobacco: Never Used  Alcohol: Yes  2 open orders  Preferred Language: English  Dialysis HistoryNone   Specialty CommentsEditShow AllReport12/12/2012:MAY RELEASE MEDICAL INFO TO [facility] 03/08/1984 05/02/13 gave pt few sx options dates/ she needs to secure transportation will call back orders on file/ kh DOS: 3/26/15[facility]-OP-left NL(11)exc Bx/gen [id number] [place]  06-29-13 pt op sx schd 8-34-19 @ [facility] no precert required per ref# [id number]. (kh,tvp)   MedicationsLong-Term New medications from outside sources are available for reconciliation  Probiotic Product (PROBIOTIC & ACIDOPHILUS EX ST PO)  tiZANidine (ZANAFLEX) 4 MG tablet    Preferred Labs   None   Transplant-Related Biopsies (11 years) ** None **  Patient Blood Type (50 years)   None                                 Recent Visits (Maximum of 10 visits)Date Type Provider Description  07/13/2013 Surgery Aveya Beal A., MD   03/28/2013 Office Visit Turner Daniels., MD Left Breast Mass (Primary Dx)         My Last Outpatient Progress NoteStatus Last Edited Encounter Date  Signed Tue Mar 28, 2013 3:28 PM EST 03/28/2013  Patient ID: [name],  female   DOB: 10/12/1955, 58 y.o.   MRN: [id number]    Chief Complaint   Patient presents with   .  New Evaluation       left breast       HPI Felicia Robbins is a 58 y.o. female.  Patient presents at the request of Dr.Yacobozzi do to discordance left breast mammogram after core biopsy a lesion in left breast upper outer quadrant. Patient has a history of dense breast tissue. She is a previous core biopsies of her breast on the past. Family history of breast cancer noted. Denies any change in the appearance of her breasts or nipple discharge or new mass. Core biopsy of the area in question in her left breast was done with clip placement. Pathology shows benign breast parenchyma with fibrocystic change and microcalcification of fat necrosis. No atypia was identified. This was felt to the discordance. HPI    Past Medical History   Diagnosis  Date   .  Depression     .  GERD (gastroesophageal reflux disease)     .  EHUDJSHF(026.3)         Past Surgical History   Procedure  Laterality  Date   .  Cholecystectomy       .  Breast surgery           Lt breast nodule - neg.   .  Parotid gland tumor excision       .  Tubal ligation       .  Biopsy bowel           small bowel biopsy- normal (colon plyps / colonoscopy 03/1999)   .  Inner ear surgery    2011       Multiple surgeries Lt ear. Hearing device implanted Lt ear 2011.       Family History   Problem  Relation  Age of Onset   .  Cancer  Mother         breast   .  Diabetes  Mother     .  Stroke  Mother     .  Multiple sclerosis  Mother     .  Crohn's disease  Brother        Social History History   Substance Use Topics   .  Smoking status:  Former Smoker       Quit date:  04/20/1986   .  Smokeless tobacco:  Never Used   .  Alcohol Use:  Yes         Comment: 2-3 drinks a week       Allergies   Allergen  Reactions   .  Tetracycline         REACTION: rash, passed out       Current Outpatient Prescriptions    Medication  Sig  Dispense  Refill   .  atenolol (TENORMIN) 50 MG tablet  Take 1 tablet (50 mg total) by mouth daily.   30 tablet   11   .  promethazine (PHENERGAN) 25 MG tablet  Take 1 tablet (25 mg total) by mouth every 8 (eight) hours as needed for nausea or vomiting.   20 tablet   0   .  tiZANidine (ZANAFLEX) 4 MG tablet  Take 1 tablet (4 mg total) by mouth at bedtime as needed. 1/2 - 1 tablet by mouth four times a day as needed for headache.   30 tablet   3       No current facility-administered medications for this visit.      Review of Systems Review of Systems  Constitutional: Negative for fever, chills and unexpected weight change.  HENT: Negative for congestion, hearing loss, sore throat, trouble swallowing and voice change.   Eyes: Negative for visual disturbance.  Respiratory: Negative for cough and wheezing.   Cardiovascular: Negative for chest pain, palpitations and leg swelling.  Gastrointestinal: Negative for nausea, vomiting, abdominal pain, diarrhea, constipation, blood in stool, abdominal distention and anal bleeding.  Genitourinary: Negative for hematuria, vaginal bleeding and difficulty urinating.  Musculoskeletal: Negative for arthralgias.  Skin: Negative for rash and wound.  Neurological: Negative for seizures, syncope and headaches.  Hematological: Negative for adenopathy. Does not bruise/bleed easily.  Psychiatric/Behavioral: Negative for confusion.    Blood pressure 122/70, pulse 83, temperature 98 F (36.7 C), resp. rate 18, height 5\' 1"  (1.549 m), weight 154 lb (69.854 kg), last menstrual period 11/07/2010.   Physical Exam Physical Exam  Constitutional: She is oriented to person, place, and time. She appears well-developed and well-nourished.  HENT:   Head: Normocephalic and atraumatic.  Eyes: Pupils are equal, round, and reactive to light. No scleral icterus.  Neck: Normal range of motion. Neck supple.  Cardiovascular: Normal rate and regular rhythm.    Pulmonary/Chest: Effort normal and breath sounds normal. Right breast exhibits no inverted nipple, no mass, no nipple discharge, no skin change and no tenderness. Left breast exhibits no inverted nipple, no mass, no nipple discharge, no skin change and no tenderness. Breasts  are symmetrical.    Musculoskeletal: Normal range of motion.  Neurological: She is alert and oriented to person, place, and time.  Skin: Skin is warm and dry.  Psychiatric: She has a normal mood and affect. Her behavior is normal. Judgment and thought content normal.    Data Reviewed   CLINICAL DATA: Abnormal screening mammogram.   EXAM:   DIGITAL DIAGNOSTIC LEFT MAMMOGRAM   ULTRASOUND LEFT BREAST   COMPARISON: Multiple priors   ACR Breast Density Category b: There are scattered areas of   fibroglandular density.   FINDINGS:   Architectural distortion in the upper-outer left breast is better   depicted on screening tomosynthesis images. No associated discrete   mass is identified mammographically.   On physical exam of the upper-outer left breast, no palpable   abnormality is identified.   Targeted ultrasound demonstrates an irregular 5 x 4 x 5 mm   hypoechoic to isoechoic mass at 2 o'clock, 5 cm from the nipple.   This likely corresponds with the mammographic abnormality. There is   associated posterior acoustic shadowing. Internal vascularity is   demonstrated. No left axillary adenopathy is identified.   IMPRESSION:   Left breast mass with associated architectural distortion.   RECOMMENDATION:   Ultrasound-guided left breast biopsy. This has been scheduled for   03/14/2013 at 2:30 p.m.   I have discussed the findings and recommendations with the patient.   Results were also provided in writing at the conclusion of the   visit. If applicable, a reminder letter will be sent to the patient   regarding the next appointment.   BI-RADS CATEGORY 4: Suspicious abnormality - biopsy should be   considered.    Electronically Signed   By: Donavan Burnet M.D.   On: 03/14/2013 16:15     Path  Benign breast parenchyma and fat necrosis Assessment Discordant mammogram left breast   Plan Discussed excision versus observation. Risks, benefits and alternative discussed. The patient would like to proceed with left breast needle localized excisional biopsy.The procedure has been discussed with the patient. Alternatives to surgery have been discussed with the patient.  Risks of surgery include bleeding,  Infection,  Seroma formation, death,  and the need for further surgery.   The patient understands and wishes to proceed.       Loriana Samad A.

## 2013-07-13 NOTE — Interval H&P Note (Signed)
History and Physical Interval Note:  07/13/2013 12:27 PM  Felicia Robbins  has presented today for surgery, with the diagnosis of left breast mass   The various methods of treatment have been discussed with the patient and family. After consideration of risks, benefits and other options for treatment, the patient has consented to  Procedure(s): BREAST BIOPSY WITH NEEDLE LOCALIZATION (Left) as a surgical intervention .  The patient's history has been reviewed, patient examined, no change in status, stable for surgery.  I have reviewed the patient's chart and labs.  Questions were answered to the patient's satisfaction.     [name] A.

## 2013-07-13 NOTE — Progress Notes (Signed)
Dr. Albertina Parr informed of EKG rhythm .  12 lead EKG at bedside

## 2013-07-13 NOTE — Op Note (Signed)
Preoperative diagnosis: left breast mammographic abnormality  Postop diagnosis: Same  Procedure: left breast lumpectomy with wire localization  Surgeon: Erroll Luna M.D.  Anesthesia: LMA with 0.25% Sensorcaine local  EBL: Less than 40 cc  Specimen:  Left Breast mass with wire and clip verified by radiography to pathology  Drains: None  Indications for procedure: The patient presents with a breast mass. Core biopsy was benign but felt discordant.  Discussed observation vs excision to determine diagnosis. Pt wished to proceed with lumpectomy given small but finite risk of malignancy. The procedure has been discussed with the patient. Alternatives to surgery have been discussed with the patient.  Risks of surgery include bleeding,  Infection,  Seroma formation, death,  and the need for further surgery.   The patient understands and wishes to proceed.   Description of procedure: The patient was seen in the holding area and the appropriate side was marked. Questions are answered. Wire localization was done by  radiology. The patient was taken back to the operating room and placed supine on the operating room table. After induction of general anesthesia, chest and upper arm  were prepped and draped in a sterile fashion. Timeout was done and she received preoperative antibiotics. Curvilinear incision was made around the wire insertion site in lateral upper quadrant. All tissue around the wire was excised and hemostasis was achieved with cautery. The area was removed in its entirety upon gross examination. Gross margin negative. Radiograph revealed the mass, wire and clip to be in the specimen. The wound was closed in layers using 3-0 Vicryl and 4-0 Monocryl subcuticular stitch. Dermabond applied. All final counts found to be correct. Patient awoke extubated taken recovery in satisfactory condition.

## 2013-07-13 NOTE — Transfer of Care (Signed)
Immediate Anesthesia Transfer of Care Note  Patient: Felicia Robbins  Procedure(s) Performed: Procedure(s): BREAST BIOPSY WITH NEEDLE LOCALIZATION (Left)  Patient Location: PACU  Anesthesia Type:General  Level of Consciousness: awake, alert  and patient cooperative  Airway & Oxygen Therapy: Patient Spontanous Breathing and Patient connected to face mask oxygen  Post-op Assessment: Report given to PACU RN and Post -op Vital signs reviewed and stable  Post vital signs: Reviewed and stable  Complications: No apparent anesthesia complications

## 2013-07-13 NOTE — Discharge Instructions (Signed)
[facility] °Office Phone Number [phone] ° °BREAST BIOPSY/ PARTIAL MASTECTOMY: POST OP INSTRUCTIONS ° °Always review your discharge instruction sheet given to you by the facility where your surgery was performed. ° °IF YOU HAVE DISABILITY OR FAMILY LEAVE FORMS, YOU MUST BRING THEM TO THE OFFICE FOR PROCESSING.  DO NOT GIVE THEM TO YOUR DOCTOR. ° °1. A prescription for pain medication may be given to you upon discharge.  Take your pain medication as prescribed, if needed.  If narcotic pain medicine is not needed, then you may take acetaminophen (Tylenol) or ibuprofen (Advil) as needed. °2. Take your usually prescribed medications unless otherwise directed °3. If you need a refill on your pain medication, please contact your pharmacy.  They will contact our office to request authorization.  Prescriptions will not be filled after 5pm or on week-ends. °4. You should eat very light the first 24 hours after surgery, such as soup, crackers, pudding, etc.  Resume your normal diet the day after surgery. °5. Most patients will experience some swelling and bruising in the breast.  Ice packs and a good support bra will help.  Swelling and bruising can take several days to resolve.  °6. It is common to experience some constipation if taking pain medication after surgery.  Increasing fluid intake and taking a stool softener will usually help or prevent this problem from occurring.  A mild laxative (Milk of Magnesia or Miralax) should be taken according to package directions if there are no bowel movements after 48 hours. °7. Unless discharge instructions indicate otherwise, you may remove your bandages 24-48 hours after surgery, and you may shower at that time.  You may have steri-strips (small skin tapes) in place directly over the incision.  These strips should be left on the skin for 7-10 days.  If your surgeon used skin glue on the incision, you may shower in 24 hours.  The glue will flake off over the  next 2-3 weeks.  Any sutures or staples will be removed at the office during your follow-up visit. °8. ACTIVITIES:  You may resume regular daily activities (gradually increasing) beginning the next day.  Wearing a good support bra or sports bra minimizes pain and swelling.  You may have sexual intercourse when it is comfortable. °a. You may drive when you no longer are taking prescription pain medication, you can comfortably wear a seatbelt, and you can safely maneuver your car and apply brakes. °b. RETURN TO WORK:  ______________________________________________________________________________________ °9. You should see your doctor in the office for a follow-up appointment approximately two weeks after your surgery.  Your doctor’s nurse will typically make your follow-up appointment when she calls you with your pathology report.  Expect your pathology report 2-3 business days after your surgery.  You may call to check if you do not hear from us after three days. °10. OTHER INSTRUCTIONS: _______________________________________________________________________________________________ _____________________________________________________________________________________________________________________________________ °_____________________________________________________________________________________________________________________________________ °_____________________________________________________________________________________________________________________________________ ° °WHEN TO CALL YOUR DOCTOR: °1. Fever over 101.0 °2. Nausea and/or vomiting. °3. Extreme swelling or bruising. °4. Continued bleeding from incision. °5. Increased pain, redness, or drainage from the incision. ° °The clinic staff is available to answer your questions during regular business hours.  Please don’t hesitate to call and ask to speak to one of the nurses for clinical concerns.  If you have a medical emergency, go to the nearest  emergency room or call 911.  A surgeon from [facility] is always on call at the hospital. ° °For further questions, please visit [email or web address]  ° ° °  Post Anesthesia Home Care Instructions ° °Activity: °Get plenty of rest for the remainder of the day. A responsible adult should stay with you for 24 hours following the procedure.  °For the next 24 hours, DO NOT: °-Drive a car °-Operate machinery °-Drink alcoholic beverages °-Take any medication unless instructed by your physician °-Make any legal decisions or sign important papers. ° °Meals: °Start with liquid foods such as gelatin or soup. Progress to regular foods as tolerated. Avoid greasy, spicy, heavy foods. If nausea and/or vomiting occur, drink only clear liquids until the nausea and/or vomiting subsides. Call your physician if vomiting continues. ° °Special Instructions/Symptoms: °Your throat may feel dry or sore from the anesthesia or the breathing tube placed in your throat during surgery. If this causes discomfort, gargle with warm salt water. The discomfort should disappear within 24 hours. ° °

## 2013-07-13 NOTE — Anesthesia Preprocedure Evaluation (Signed)
Anesthesia Evaluation  Patient identified by MRN, date of birth, ID band Patient awake    Reviewed: Allergy & Precautions, H&P , NPO status , Patient's Chart, lab work & pertinent test results, reviewed documented beta blocker date and time   Airway Mallampati: II TM Distance: >3 FB Neck ROM: full    Dental   Pulmonary neg pulmonary ROS, former smoker,  breath sounds clear to auscultation        Cardiovascular hypertension, Rhythm:regular     Neuro/Psych  Headaches, PSYCHIATRIC DISORDERS Depression    GI/Hepatic Neg liver ROS, GERD-  Medicated and Controlled,  Endo/Other  negative endocrine ROS  Renal/GU negative Renal ROS  negative genitourinary   Musculoskeletal   Abdominal   Peds  Hematology negative hematology ROS (+)   Anesthesia Other Findings See surgeon's H&P   Reproductive/Obstetrics negative OB ROS                           Anesthesia Physical Anesthesia Plan  ASA: II  Anesthesia Plan: General   Post-op Pain Management:    Induction: Intravenous  Airway Management Planned: LMA  Additional Equipment:   Intra-op Plan:   Post-operative Plan:   Informed Consent: I have reviewed the patients History and Physical, chart, labs and discussed the procedure including the risks, benefits and alternatives for the proposed anesthesia with the patient or authorized representative who has indicated his/her understanding and acceptance.   Dental Advisory Given  Plan Discussed with: CRNA and Surgeon  Anesthesia Plan Comments:         Anesthesia Quick Evaluation

## 2013-07-14 ENCOUNTER — Encounter (HOSPITAL_BASED_OUTPATIENT_CLINIC_OR_DEPARTMENT_OTHER): Payer: Self-pay | Admitting: Surgery

## 2013-07-18 ENCOUNTER — Telehealth (INDEPENDENT_AMBULATORY_CARE_PROVIDER_SITE_OTHER): Payer: Self-pay

## 2013-07-18 NOTE — Telephone Encounter (Signed)
Spoke to pt and gave her benign path result.

## 2013-07-18 NOTE — Telephone Encounter (Signed)
Message copied by Carlene Coria on Tue Jul 18, 2013  9:56 AM ------      Message from: Erroll Luna A      Created: Mon Jul 17, 2013  1:48 PM       b9 ------

## 2013-07-31 ENCOUNTER — Encounter (INDEPENDENT_AMBULATORY_CARE_PROVIDER_SITE_OTHER): Payer: Self-pay | Admitting: Surgery

## 2013-07-31 ENCOUNTER — Ambulatory Visit (INDEPENDENT_AMBULATORY_CARE_PROVIDER_SITE_OTHER): Payer: Managed Care, Other (non HMO) | Admitting: Surgery

## 2013-07-31 VITALS — BP 130/80 | HR 80 | Resp 14 | Ht 61.0 in | Wt 156.4 lb

## 2013-07-31 DIAGNOSIS — Z9889 Other specified postprocedural states: Secondary | ICD-10-CM

## 2013-07-31 NOTE — Patient Instructions (Addendum)
Return as needed. May remove glue if desired.

## 2013-07-31 NOTE — Progress Notes (Signed)
Patient returns a left breast lumpectomy. She's 2 weeks out doing well.  Path: Breast, lumpectomy, Left - BENIGN BREAST TISSUE, SEE COMMENT. - NEGATIVE FOR ATYPIA OR MALIGNANCY. - PREVIOUS BIOPSY SITE IDENTIFIED. - CALCIFICATIONS IDENTIFIED. - SURGICAL MARGIN, NEGATIVE FOR ATYPIA OR MALIGNANCY. Microscopic Comment Multiple representative sections, including the 0.9 cm area of interest, were submitted for review. Slide sections demonstrate non neoplastic findings to include predominately previous biopsy site change. Additionally there is fibrocystic change, columnar cell hyperplasia/change without atypia, and pseudoangiomatous stromal hyperplasia. There is an incidental benign calcified fibroadenoma present. There are both microscopic as well as vascular calcifications identified. There are no features of atypia or malignancy present. The surgical resection margin(s) of the specimen were inked and microscopically evaluated. (CRR:gt, 07/17/13) Mali RUND DO Pathologist, Electronic Signature (Case signed 07/17/2013)      Exam: incision C/D/I intact to left breast     A/P:  POD 17  Resume full activity Resume full activity

## 2013-11-05 IMAGING — MG MM DIGITAL SCREENING BILAT W/ CAD
9 of 12 series · 9 of 28 positions shown · non-contrast
Comparison: Previous Exam(s)

CLINICAL DATA: Screening.

EXAM:
DIGITAL SCREENING BILATERAL MAMMOGRAM WITH CAD
DIGITAL BREAST TOMOSYNTHESIS
Digital breast tomosynthesis images are acquired in two projections.
These images are reviewed in combination with the digital mammogram,
confirming the findings below.

[R CC]
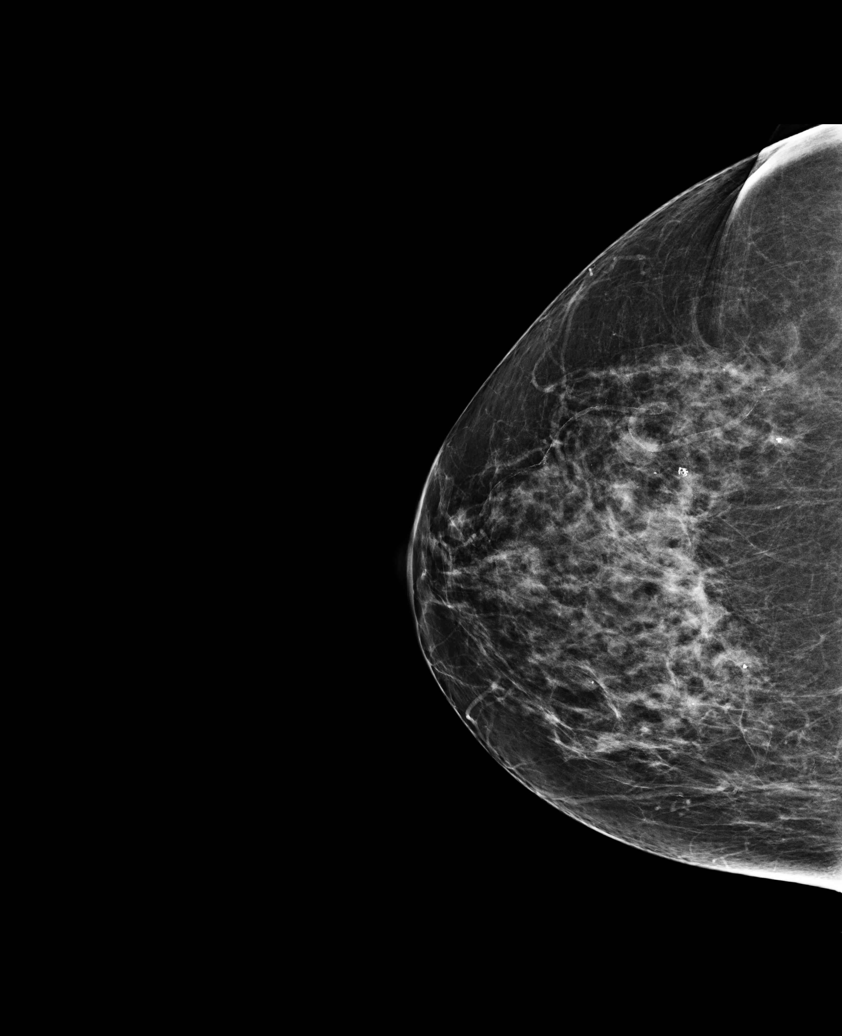

[L CC]
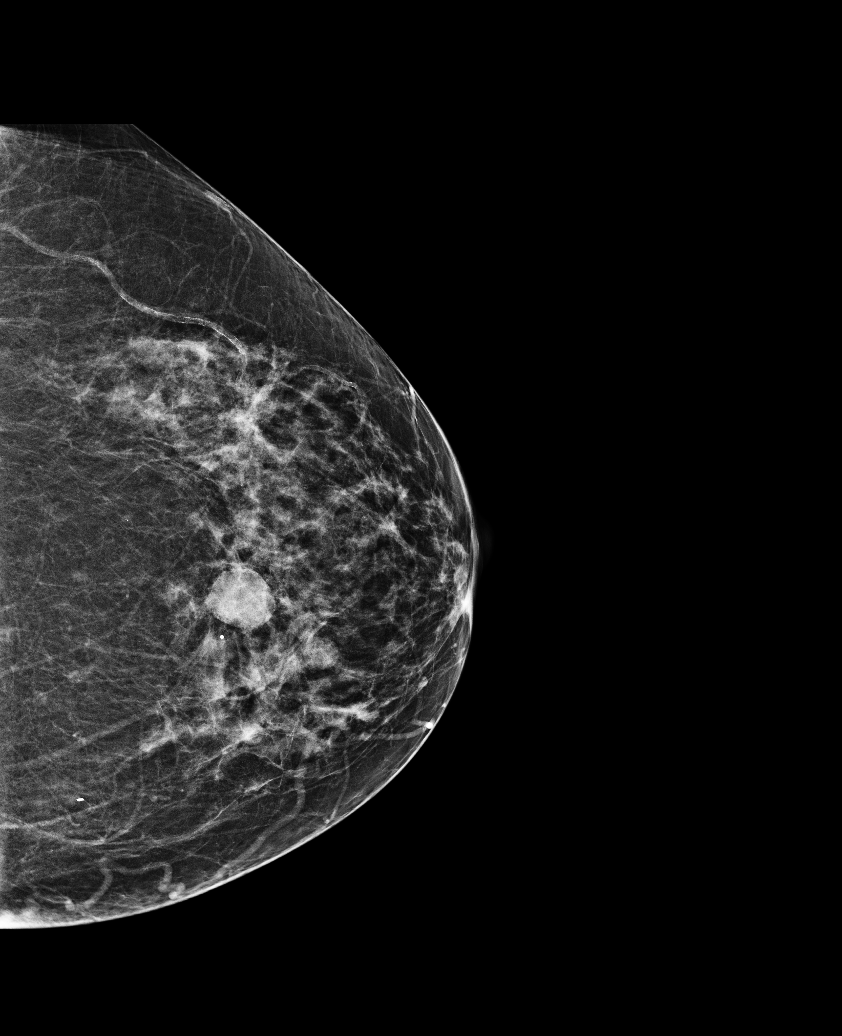

[R MLO]
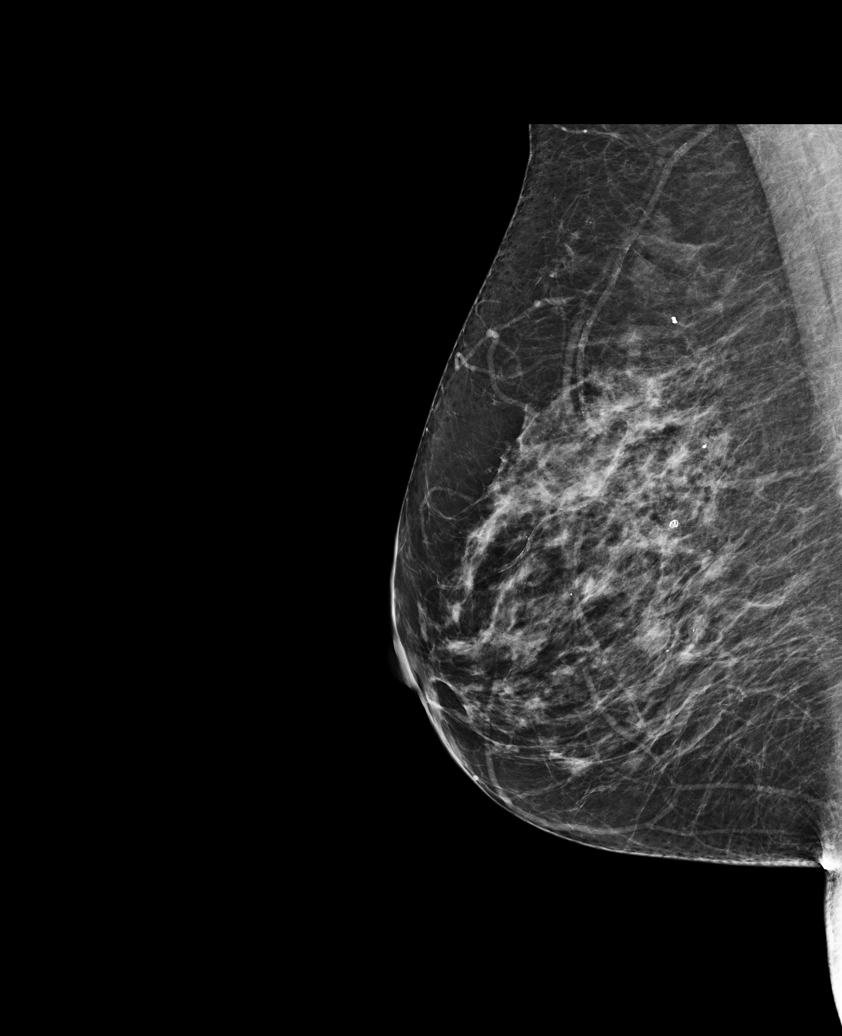

[L MLO]
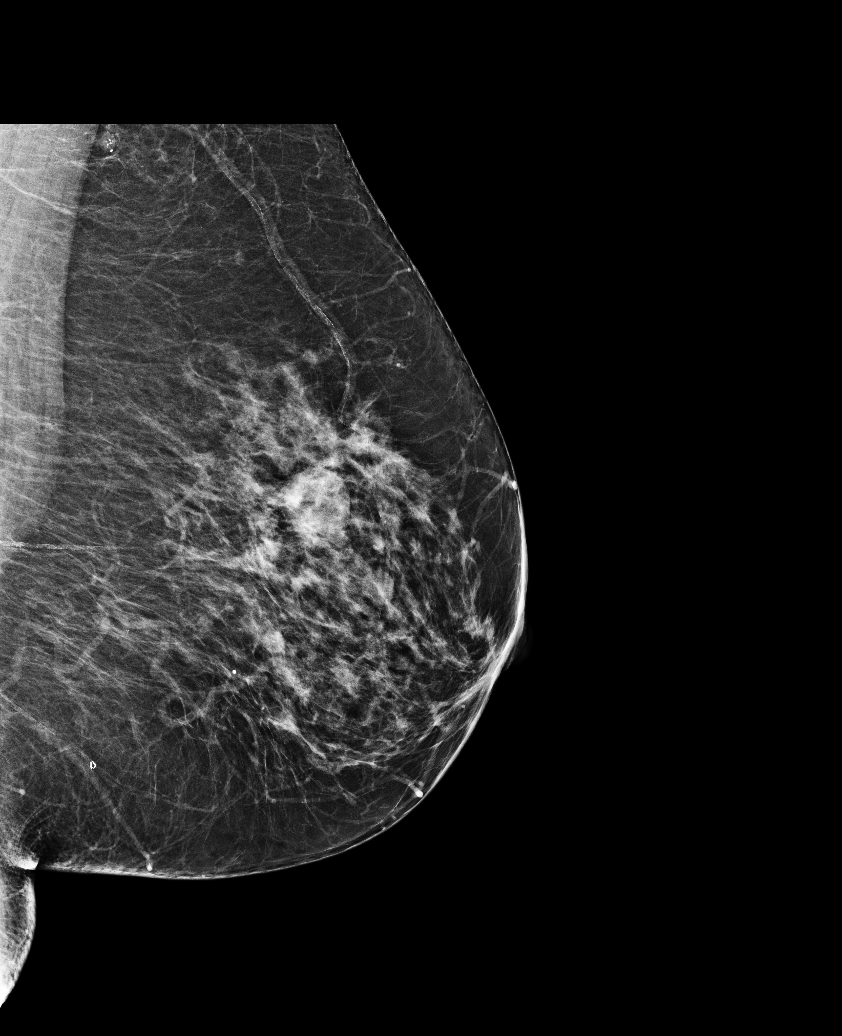

[R MLO tomo]
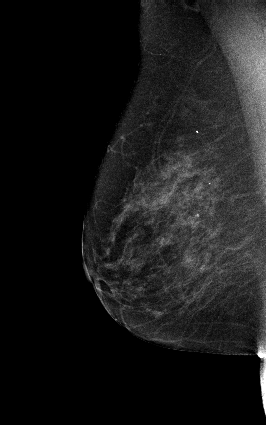

[L MLO tomo (1 of 2)]
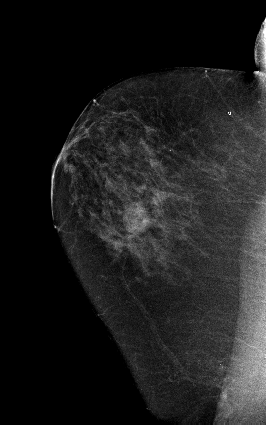

[R CC tomo]
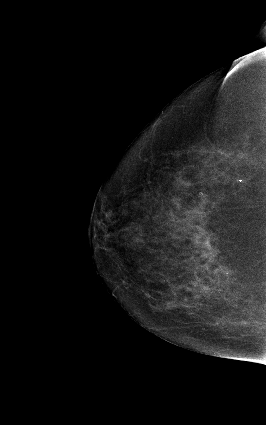

[L CC tomo]
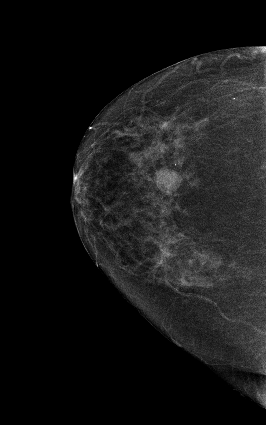

[L MLO tomo (2 of 2) · tomo slice 37/73.0]
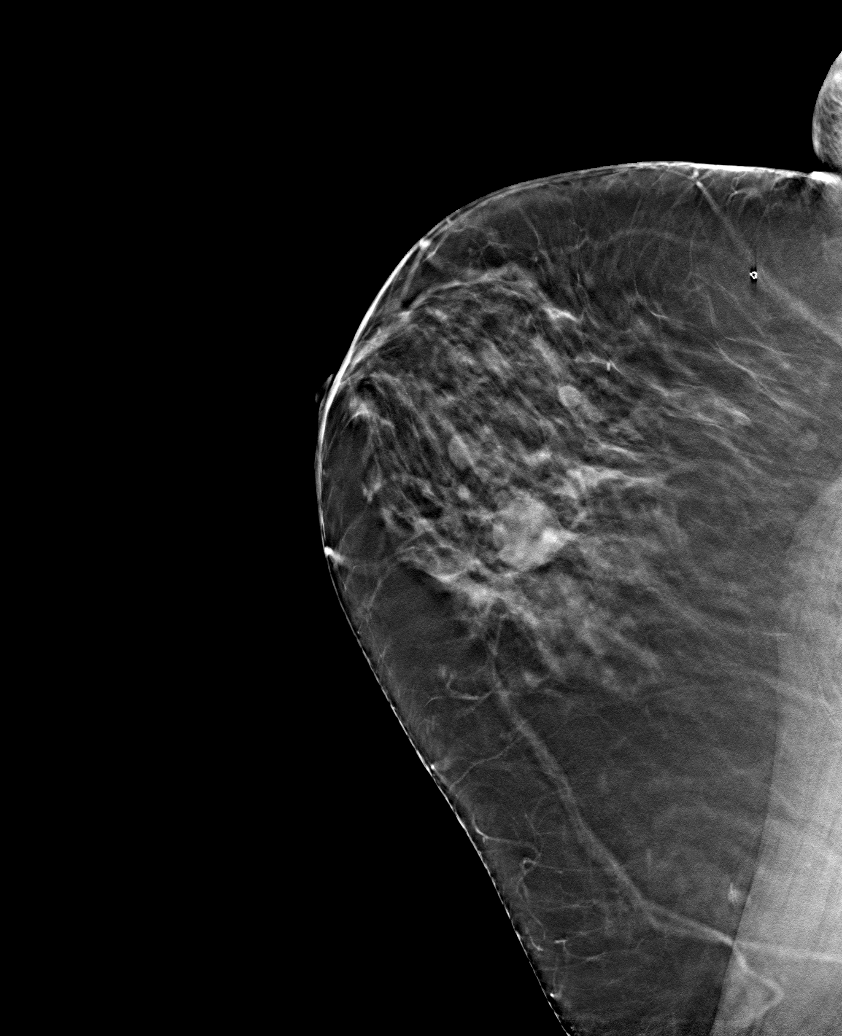

[9 of 28 positions shown; findings below may reference images not displayed]

ACR Breast Density Category c: The breasts are heterogeneously
dense, which may obscure small masses.
FINDINGS: In the left breast, possible distortion warrants further evaluation
with spot compression views and possibly ultrasound. In the right
breast, no suspicious masses or malignant type calcifications are
identified. Images were processed with CAD.
IMPRESSION: Further evaluation is suggested for possible distortion in the left
breast.

RECOMMENDATION:
Diagnostic mammogram and possibly ultrasound of the left breast.
(Code:[id number]-[id number])

The patient will be contacted regarding the findings, and additional
imaging will be scheduled.

BI-RADS CATEGORY  0: Incomplete. Need additional imaging evaluation
and/or prior mammograms for comparison.

## 2014-01-07 ENCOUNTER — Telehealth: Payer: Self-pay | Admitting: Family Medicine

## 2014-01-07 DIAGNOSIS — Z0000 Encounter for general adult medical examination without abnormal findings: Secondary | ICD-10-CM

## 2014-01-07 NOTE — Telephone Encounter (Signed)
Message copied by Abner Greenspan on Sun Jan 07, 2014  8:56 PM ------      Message from: Ellamae Sia      Created: Wed Jan 03, 2014  6:02 PM      Regarding: Lab orders for Monday, 9.21.15       Patient is scheduled for CPX labs, please order future labs, Thanks , Felicia Robbins       ------

## 2014-01-08 ENCOUNTER — Other Ambulatory Visit (INDEPENDENT_AMBULATORY_CARE_PROVIDER_SITE_OTHER): Payer: Managed Care, Other (non HMO)

## 2014-01-08 DIAGNOSIS — Z0000 Encounter for general adult medical examination without abnormal findings: Secondary | ICD-10-CM

## 2014-01-08 LAB — CBC WITH DIFFERENTIAL/PLATELET
Basophils Absolute: 0 10*3/uL (ref 0.0–0.1)
Basophils Relative: 0.3 % (ref 0.0–3.0)
EOS PCT: 2.7 % (ref 0.0–5.0)
Eosinophils Absolute: 0.2 10*3/uL (ref 0.0–0.7)
HCT: 41.2 % (ref 36.0–46.0)
Hemoglobin: 13.9 g/dL (ref 12.0–15.0)
LYMPHS PCT: 35 % (ref 12.0–46.0)
Lymphs Abs: 2 10*3/uL (ref 0.7–4.0)
MCHC: 33.6 g/dL (ref 30.0–36.0)
MCV: 93.4 fl (ref 78.0–100.0)
Monocytes Absolute: 0.5 10*3/uL (ref 0.1–1.0)
Monocytes Relative: 9.4 % (ref 3.0–12.0)
NEUTROS PCT: 52.6 % (ref 43.0–77.0)
Neutro Abs: 3 10*3/uL (ref 1.4–7.7)
Platelets: 275 10*3/uL (ref 150.0–400.0)
RBC: 4.42 Mil/uL (ref 3.87–5.11)
RDW: 13.8 % (ref 11.5–15.5)
WBC: 5.7 10*3/uL (ref 4.0–10.5)

## 2014-01-08 LAB — LIPID PANEL
Cholesterol: 209 mg/dL — ABNORMAL HIGH (ref 0–200)
HDL: 67.6 mg/dL (ref >39.00)
LDL Cholesterol: 128 mg/dL — ABNORMAL HIGH (ref 0–99)
NonHDL: 141.4
TRIGLYCERIDES: 66 mg/dL (ref 0.0–149.0)
Total CHOL/HDL Ratio: 3
VLDL: 13.2 mg/dL (ref 0.0–40.0)

## 2014-01-08 LAB — COMPREHENSIVE METABOLIC PANEL
ALBUMIN: 4.5 g/dL (ref 3.5–5.2)
ALT: 18 U/L (ref 0–35)
AST: 22 U/L (ref 0–37)
Alkaline Phosphatase: 71 U/L (ref 39–117)
BUN: 16 mg/dL (ref 6–23)
CHLORIDE: 108 meq/L (ref 96–112)
CO2: 27 mEq/L (ref 19–32)
Calcium: 9.8 mg/dL (ref 8.4–10.5)
Creatinine, Ser: 0.7 mg/dL (ref 0.4–1.2)
GFR: 93.13 mL/min (ref >60.00)
GLUCOSE: 104 mg/dL — AB (ref 70–99)
POTASSIUM: 4.2 meq/L (ref 3.5–5.1)
SODIUM: 140 meq/L (ref 135–145)
TOTAL PROTEIN: 8 g/dL (ref 6.0–8.3)
Total Bilirubin: 0.8 mg/dL (ref 0.2–1.2)

## 2014-01-08 LAB — TSH: TSH: 0.48 u[IU]/mL (ref 0.35–4.50)

## 2014-01-12 ENCOUNTER — Ambulatory Visit (INDEPENDENT_AMBULATORY_CARE_PROVIDER_SITE_OTHER): Payer: Managed Care, Other (non HMO) | Admitting: Family Medicine

## 2014-01-12 ENCOUNTER — Encounter: Payer: Self-pay | Admitting: Family Medicine

## 2014-01-12 ENCOUNTER — Ambulatory Visit (INDEPENDENT_AMBULATORY_CARE_PROVIDER_SITE_OTHER)
Admission: RE | Admit: 2014-01-12 | Discharge: 2014-01-12 | Disposition: A | Discharge status: Home / Self Care | Payer: Managed Care, Other (non HMO) | Source: Ambulatory Visit | Attending: Family Medicine | Admitting: Family Medicine

## 2014-01-12 VITALS — BP 108/72 | HR 71 | Temp 98.9°F | Ht 61.0 in | Wt 156.5 lb

## 2014-01-12 DIAGNOSIS — I10 Essential (primary) hypertension: Secondary | ICD-10-CM

## 2014-01-12 DIAGNOSIS — N950 Postmenopausal bleeding: Secondary | ICD-10-CM

## 2014-01-12 DIAGNOSIS — R079 Chest pain, unspecified: Secondary | ICD-10-CM

## 2014-01-12 DIAGNOSIS — R0781 Pleurodynia: Secondary | ICD-10-CM

## 2014-01-12 DIAGNOSIS — S0080XA Unspecified superficial injury of other part of head, initial encounter: Secondary | ICD-10-CM

## 2014-01-12 DIAGNOSIS — S0000XA Unspecified superficial injury of scalp, initial encounter: Secondary | ICD-10-CM

## 2014-01-12 DIAGNOSIS — S0081XA Abrasion of other part of head, initial encounter: Secondary | ICD-10-CM | POA: Insufficient documentation

## 2014-01-12 DIAGNOSIS — S1090XA Unspecified superficial injury of unspecified part of neck, initial encounter: Secondary | ICD-10-CM

## 2014-01-12 DIAGNOSIS — Z0000 Encounter for general adult medical examination without abnormal findings: Secondary | ICD-10-CM

## 2014-01-12 MED ORDER — TIZANIDINE HCL 4 MG PO TABS: 4.0000 mg | ORAL_TABLET | Freq: Every evening | ORAL | Status: DC | PRN

## 2014-01-12 MED ORDER — HYDROCODONE-IBUPROFEN 5-200 MG PO TABS: 1.0000 | ORAL_TABLET | Freq: Three times a day (TID) | ORAL | Status: DC | PRN

## 2014-01-12 NOTE — Assessment & Plan Note (Signed)
Ref to gyn- had a full period in July

## 2014-01-12 NOTE — Assessment & Plan Note (Signed)
bp in fair control at this time  BP Readings from Last 1 Encounters:  01/12/14 108/72   No changes needed-controlled with lifestyle Disc lifstyle change with low sodium diet and exercise

## 2014-01-12 NOTE — Assessment & Plan Note (Signed)
After slip and fall in tub  Suspect contused or fx ribs  Xray today Disc need for deep breaths vicoprofen prn -warned of sedation and constipation  Will use heat  Update

## 2014-01-12 NOTE — Assessment & Plan Note (Signed)
From cat this am (hers)  Cleaned at home  No s/s of infection but will watch carefully Wound care with soap/water/abx oint and update

## 2014-01-12 NOTE — Progress Notes (Signed)
Subjective:    Patient ID: Felicia Robbins, female    DOB: 1956-03-01, 58 y.o.   MRN: [id number]  HPI Here for health maintenance exam and to review chronic medical problems    Also scratched by her cat this am on the face  It bled - is lateral to L eye  Then she fell in the shower - her R side/ ribs hit the side of the tub Did not hit her head  Some pain with breathing  Not sob   Wt is stable - but she is trying to loose  Eating very healthy  Not exercising  bmi 29  Flu vaccine got 2 weeks ago   Pap nl 8/12 Menopausal- she gets one period per year for the past 2 years  Wants to do her pap today  No symptoms   Mammogram 12/14 -will be due for screening in Nov  Has had numerous bx for fibrocystic dz Self exam-usually lumpy- better than usual   colonosc 9/09 nl   Td 2/14  bp is stable today  No cp or palpitations or headaches or edema  No side effects to medicines  BP Readings from Last 3 Encounters:  01/12/14 108/72  07/31/13 130/80  07/13/13 129/70      Results for orders placed in visit on 01/08/14  CBC WITH DIFFERENTIAL      Result Value Ref Range   WBC 5.7  4.0 - 10.5 K/uL   RBC 4.42  3.87 - 5.11 Mil/uL   Hemoglobin 13.9  12.0 - 15.0 g/dL   HCT 41.2  36.0 - 46.0 %   MCV 93.4  78.0 - 100.0 fl   MCHC 33.6  30.0 - 36.0 g/dL   RDW 13.8  11.5 - 15.5 %   Platelets 275.0  150.0 - 400.0 K/uL   Neutrophils Relative % 52.6  43.0 - 77.0 %   Lymphocytes Relative 35.0  12.0 - 46.0 %   Monocytes Relative 9.4  3.0 - 12.0 %   Eosinophils Relative 2.7  0.0 - 5.0 %   Basophils Relative 0.3  0.0 - 3.0 %   Neutro Abs 3.0  1.4 - 7.7 K/uL   Lymphs Abs 2.0  0.7 - 4.0 K/uL   Monocytes Absolute 0.5  0.1 - 1.0 K/uL   Eosinophils Absolute 0.2  0.0 - 0.7 K/uL   Basophils Absolute 0.0  0.0 - 0.1 K/uL  COMPREHENSIVE METABOLIC PANEL      Result Value Ref Range   Sodium 140  135 - 145 mEq/L   Potassium 4.2  3.5 - 5.1 mEq/L   Chloride 108  96 - 112 mEq/L   CO2 27  19 - 32  mEq/L   Glucose, Bld 104 (*) 70 - 99 mg/dL   BUN 16  6 - 23 mg/dL   Creatinine, Ser 0.7  0.4 - 1.2 mg/dL   Total Bilirubin 0.8  0.2 - 1.2 mg/dL   Alkaline Phosphatase 71  39 - 117 U/L   AST 22  0 - 37 U/L   ALT 18  0 - 35 U/L   Total Protein 8.0  6.0 - 8.3 g/dL   Albumin 4.5  3.5 - 5.2 g/dL   Calcium 9.8  8.4 - 10.5 mg/dL   GFR 93.13  >60.00 mL/min  LIPID PANEL      Result Value Ref Range   Cholesterol 209 (*) 0 - 200 mg/dL   Triglycerides 66.0  0.0 - 149.0 mg/dL   HDL 67.60  >  39.00 mg/dL   VLDL 13.2  0.0 - 40.0 mg/dL   LDL Cholesterol 128 (*) 0 - 99 mg/dL   Total CHOL/HDL Ratio 3     NonHDL 141.40    TSH      Result Value Ref Range   TSH 0.48  0.35 - 4.50 uIU/mL    Overall pretty stable    Patient Active Problem List   Diagnosis Date Noted  . Acute pharyngitis 05/10/2013  . Left breast mass 03/28/2013  . Viral URI 02/27/2013  . Nausea with vomiting 02/27/2013  . Infected cat scratch of forearm 06/07/2012  . Stress reaction, emotional 01/19/2012  . Trochanteric bursitis of both hips 08/03/2011  . Vertigo 12/11/2010  . Gynecologic exam normal 12/08/2010  . Routine general medical examination at a health care facility 11/29/2010  . Hot flashes 08/11/2010  . RHINITIS 08/15/2007  . FIBROCYSTIC BREAST DISEASE 09/24/2006  . HX, PERSONAL, COLONIC POLYPS 09/24/2006  . MIGRAINE, COMMON 08/25/2006  . DECREASED HEARING 08/25/2006  . HYPERTENSION, ESSENTIAL NOS 08/25/2006  . Esophageal Reflux 08/25/2006  . MIGRAINES, HX OF 08/25/2006   Past Medical History  Diagnosis Date  . Depression   . GERD (gastroesophageal reflux disease)   . Headache(784.0)   . HOH (hard of hearing)     left-has inplant  . WPW (Wolff-Parkinson-White syndrome)     See scanned history note   Past Surgical History  Procedure Laterality Date  . Breast surgery      Lt breast nodule - neg.  . 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 Tubal ligation    . Biopsy bowel      small bowel  biopsy- normal (colon plyps / colonoscopy 03/1999)  . Inner ear surgery  2011    Multiple surgeries Lt ear. Hearing device implanted Lt ear 2011.  Marland Kitchen Cholecystectomy  2005    lap choli  . Breast biopsy Left 07/13/2013    Procedure: BREAST BIOPSY WITH NEEDLE LOCALIZATION;  Surgeon: Marcello Moores A. Cornett, MD;  Location: [facility];  Service: General;  Laterality: Left;   History  Substance Use Topics  . Smoking status: Former Smoker    Quit date: 04/20/1986  . Smokeless tobacco: Never Used  . Alcohol Use: Yes     Comment: 2-3 drinks a week   Family History  Problem Relation Age of Onset  . Cancer Mother     breast  . Diabetes Mother   . Stroke Mother   . Multiple sclerosis Mother   . Crohn's disease Brother    Allergies  Allergen Reactions  . Tetracycline     REACTION: rash, passed out   Current Outpatient Prescriptions on File Prior to Visit  Medication Sig Dispense Refill  . hydrocodone-ibuprofen (VICOPROFEN) 5-200 MG per tablet Take 1 tablet by mouth every 8 (eight) hours as needed for pain.  30 tablet  0  . tiZANidine (ZANAFLEX) 4 MG tablet Take 1 tablet (4 mg total) by mouth at bedtime as needed. 1/2 - 1 tablet by mouth four times a day as needed for headache.  30 tablet  3   No current facility-administered medications on file prior to visit.    Review of Systems Review of Systems  Constitutional: Negative for fever, appetite change,  and unexpected weight change.  Eyes: Negative for pain and visual disturbance.  Respiratory: Negative for cough and shortness of breath.  pos for chest wall pain on R that is pleuritic  Cardiovascular: Negative for cp  or palpitations    Gastrointestinal: Negative for nausea, diarrhea and constipation.  Genitourinary: Negative for urgency and frequency.  Skin: Negative for pallor or rash  pos for cat scratches on face  Neurological: Negative for weakness, light-headedness, numbness and headaches.  Hematological: Negative for  adenopathy. Does not bruise/bleed easily.  Psychiatric/Behavioral: Negative for dysphoric mood. The patient is not nervous/anxious.         Objective:   Physical Exam  Constitutional: She appears well-developed and well-nourished. No distress.  overwt and well appearing  HENT:  Head: Normocephalic.  Right Ear: External ear normal.  Nose: Nose normal.  Mouth/Throat: Oropharynx is clear and moist.  Baseline deformity L ear   Eyes: Conjunctivae and EOM are normal. Pupils are equal, round, and reactive to light. Right eye exhibits no discharge. Left eye exhibits no discharge. No scleral icterus.  Neck: Normal range of motion. Neck supple. No JVD present. No thyromegaly present.  Cardiovascular: Normal rate, regular rhythm, normal heart sounds and intact distal pulses.  Exam reveals no gallop.   Pulmonary/Chest: Effort normal and breath sounds normal. No respiratory distress. She has no wheezes. She has no rales. She exhibits tenderness.  Marked chest wall tenderness L lateral- no step off palpated, no crepitus or skin changes     Abdominal: Soft. Bowel sounds are normal. She exhibits no distension and no mass. There is no tenderness.  Musculoskeletal: She exhibits tenderness. She exhibits no edema.  Lymphadenopathy:    She has no cervical adenopathy.  Neurological: She is alert. She has normal reflexes. No cranial nerve deficit. She exhibits normal muscle tone. Coordination normal.  Skin: Skin is warm and dry. No rash noted. No erythema. No pallor.  Several clean superficial scratches on L face w/o erythema  Psychiatric: She has a normal mood and affect.          Assessment & Plan:   Problem List Items Addressed This Visit     Cardiovascular and Mediastinum   HYPERTENSION, ESSENTIAL NOS      bp in fair control at this time  BP Readings from Last 1 Encounters:  01/12/14 108/72   No changes needed-controlled with lifestyle Disc lifstyle change with low sodium diet and  exercise        Other   Routine general medical examination at a health care facility - Primary     Reviewed health habits including diet and exercise and skin cancer prevention Reviewed appropriate screening tests for age  Also reviewed health mt list, fam hx and immunization status , as well as social and family history   Labs rev  See HPI     Post-menopausal bleeding     Ref to gyn- had a full period in July      Relevant Orders      Ambulatory referral to Gynecology   Rib pain on right side     After slip and fall in tub  Suspect contused or fx ribs  Xray today Disc need for deep breaths vicoprofen prn -warned of sedation and constipation  Will use heat  Update     Relevant Orders      DG Ribs Unilateral Right (Completed)   Scratch of face     From cat this am (hers)  Cleaned at home  No s/s of infection but will watch carefully Wound care with soap/water/abx oint and update

## 2014-01-12 NOTE — Assessment & Plan Note (Signed)
Reviewed health habits including diet and exercise and skin cancer prevention Reviewed appropriate screening tests for age  Also reviewed health mt list, fam hx and immunization status , as well as social and family history   Labs rev  See HPI

## 2014-01-12 NOTE — Progress Notes (Signed)
Pre visit review using our clinic review tool, if applicable. No additional management support is needed unless otherwise documented below in the visit note. 

## 2014-01-12 NOTE — Patient Instructions (Signed)
Take the pain medicine if needed - you may need a stool softener with it  Use gentle heat  Xray now - we will update with result  Stop at check out for ref to gyn  Keep scratches clean with soap and water -continue neosporin and update me if redness or swelling worsen    Take care of yourself

## 2014-03-21 ENCOUNTER — Other Ambulatory Visit: Payer: Self-pay | Admitting: Obstetrics & Gynecology

## 2014-03-22 LAB — CYTOLOGY - PAP

## 2014-04-03 LAB — HM MAMMOGRAPHY: HM Mammogram: NORMAL

## 2014-05-17 ENCOUNTER — Encounter: Payer: Self-pay | Admitting: Internal Medicine

## 2014-05-17 ENCOUNTER — Ambulatory Visit (INDEPENDENT_AMBULATORY_CARE_PROVIDER_SITE_OTHER): Payer: Managed Care, Other (non HMO) | Admitting: Internal Medicine

## 2014-05-17 VITALS — BP 110/70 | HR 102 | Temp 98.5°F | Wt 150.0 lb

## 2014-05-17 DIAGNOSIS — B9789 Other viral agents as the cause of diseases classified elsewhere: Principal | ICD-10-CM

## 2014-05-17 DIAGNOSIS — J069 Acute upper respiratory infection, unspecified: Secondary | ICD-10-CM

## 2014-05-17 DIAGNOSIS — J029 Acute pharyngitis, unspecified: Secondary | ICD-10-CM

## 2014-05-17 LAB — POCT RAPID STREP A (OFFICE): RAPID STREP A SCREEN: NEGATIVE

## 2014-05-17 MED ORDER — HYDROCODONE-HOMATROPINE 5-1.5 MG/5ML PO SYRP: 5.0000 mL | ORAL_SOLUTION | Freq: Three times a day (TID) | ORAL | Status: DC | PRN

## 2014-05-17 MED ORDER — AZITHROMYCIN 250 MG PO TABS: ORAL_TABLET | ORAL | Status: DC

## 2014-05-17 MED ORDER — METHYLPREDNISOLONE ACETATE 80 MG/ML IJ SUSP
80.0000 mg | Freq: Once | INTRAMUSCULAR | Status: AC
  Administered 2014-05-17: 80 mg via INTRAMUSCULAR

## 2014-05-17 NOTE — Patient Instructions (Signed)
Upper Respiratory Infection, Adult An upper respiratory infection (URI) is also sometimes known as the common cold. The upper respiratory tract includes the nose, sinuses, throat, trachea, and bronchi. Bronchi are the airways leading to the lungs. Most people improve within 1 week, but symptoms can last up to 2 weeks. A residual cough may last even longer.  CAUSES Many different viruses can infect the tissues lining the upper respiratory tract. The tissues become irritated and inflamed and often become very moist. Mucus production is also common. A cold is contagious. You can easily spread the virus to others by oral contact. This includes kissing, sharing a glass, coughing, or sneezing. Touching your mouth or nose and then touching a surface, which is then touched by another person, can also spread the virus. SYMPTOMS  Symptoms typically develop 1 to 3 days after you come in contact with a cold virus. Symptoms vary from person to person. They may include:  Runny nose.  Sneezing.  Nasal congestion.  Sinus irritation.  Sore throat.  Loss of voice (laryngitis).  Cough.  Fatigue.  Muscle aches.  Loss of appetite.  Headache.  Low-grade fever. DIAGNOSIS  You might diagnose your own cold based on familiar symptoms, since most people get a cold 2 to 3 times a year. Your caregiver can confirm this based on your exam. Most importantly, your caregiver can check that your symptoms are not due to another disease such as strep throat, sinusitis, pneumonia, asthma, or epiglottitis. Blood tests, throat tests, and X-rays are not necessary to diagnose a common cold, but they may sometimes be helpful in excluding other more serious diseases. Your caregiver will decide if any further tests are required. RISKS AND COMPLICATIONS  You may be at risk for a more severe case of the common cold if you smoke cigarettes, have chronic heart disease (such as heart failure) or lung disease (such as asthma), or if  you have a weakened immune system. The very young and very old are also at risk for more serious infections. Bacterial sinusitis, middle ear infections, and bacterial pneumonia can complicate the common cold. The common cold can worsen asthma and chronic obstructive pulmonary disease (COPD). Sometimes, these complications can require emergency medical care and may be life-threatening. PREVENTION  The best way to protect against getting a cold is to practice good hygiene. Avoid oral or hand contact with people with cold symptoms. Wash your hands often if contact occurs. There is no clear evidence that vitamin C, vitamin E, echinacea, or exercise reduces the chance of developing a cold. However, it is always recommended to get plenty of rest and practice good nutrition. TREATMENT  Treatment is directed at relieving symptoms. There is no cure. Antibiotics are not effective, because the infection is caused by a virus, not by bacteria. Treatment may include:  Increased fluid intake. Sports drinks offer valuable electrolytes, sugars, and fluids.  Breathing heated mist or steam (vaporizer or shower).  Eating chicken soup or other clear broths, and maintaining good nutrition.  Getting plenty of rest.  Using gargles or lozenges for comfort.  Controlling fevers with ibuprofen or acetaminophen as directed by your caregiver.  Increasing usage of your inhaler if you have asthma. Zinc gel and zinc lozenges, taken in the first 24 hours of the common cold, can shorten the duration and lessen the severity of symptoms. Pain medicines may help with fever, muscle aches, and throat pain. A variety of non-prescription medicines are available to treat congestion and runny nose. Your caregiver   can make recommendations and may suggest nasal or lung inhalers for other symptoms.  HOME CARE INSTRUCTIONS   Only take over-the-counter or prescription medicines for pain, discomfort, or fever as directed by your  caregiver.  Use a warm mist humidifier or inhale steam from a shower to increase air moisture. This may keep secretions moist and make it easier to breathe.  Drink enough water and fluids to keep your urine clear or pale yellow.  Rest as needed.  Return to work when your temperature has returned to normal or as your caregiver advises. You may need to stay home longer to avoid infecting others. You can also use a face mask and careful hand washing to prevent spread of the virus. SEEK MEDICAL CARE IF:   After the first few days, you feel you are getting worse rather than better.  You need your caregiver's advice about medicines to control symptoms.  You develop chills, worsening shortness of breath, or brown or red sputum. These may be signs of pneumonia.  You develop yellow or brown nasal discharge or pain in the face, especially when you bend forward. These may be signs of sinusitis.  You develop a fever, swollen neck glands, pain with swallowing, or white areas in the back of your throat. These may be signs of strep throat. SEEK IMMEDIATE MEDICAL CARE IF:   You have a fever.  You develop severe or persistent headache, ear pain, sinus pain, or chest pain.  You develop wheezing, a prolonged cough, cough up blood, or have a change in your usual mucus (if you have chronic lung disease).  You develop sore muscles or a stiff neck. Document Released: 09/30/2000 Document Revised: 06/29/2011 Document Reviewed: 07/12/2013 [employer] Patient Information 2015 [employer]. This information is not intended to replace advice given to you by your health care provider. Make sure you discuss any questions you have with your health care provider.  

## 2014-05-17 NOTE — Progress Notes (Signed)
HPI  Felicia Robbins is a 59 y.o. female presenting with c/o sore throat, cough and ear pain x 2 days. Cough is productive with greenish-brown sputum. Sick since jan 1st with a barking cough and it "keeps morphing into something different."  Cleared up and a few days later a head cold came on for a few weeks which cleared up and now she has the sore throat and ear pain. Felicia Robbins has had some improvement of symptoms - felt the worst yesterday. She has tried Claritin without relief. Her daughter's been sick with a head cold/ear pain. No fever, chills, n/v, or SOB. She had acute pharyngitis last year in which she was treated w/ antbx. Acute tonsillitis 3 years ago. PSH of mastoidectomies x 5 in Left ear. Tachycardic, HR 102. Has WPW.  Review of Systems      Past Medical History  Diagnosis Date  . Depression   . GERD (gastroesophageal reflux disease)   . Headache(784.0)   . HOH (hard of hearing)     left-has inplant  . WPW (Wolff-Parkinson-White syndrome)     See scanned history note    Family History  Problem Relation Age of Onset  . Cancer Mother     breast  . Diabetes Mother   . Stroke Mother   . Multiple sclerosis Mother   . Crohn's disease Brother     History   Social History  . Marital Status: Legally Separated    Spouse Name: N/A    Number of Children: N/A  . Years of Education: N/A   Occupational History  . Not on file.   Social History Main Topics  . Smoking status: Former Smoker    Quit date: 04/20/1986  . Smokeless tobacco: Never Used  . Alcohol Use: Yes     Comment: 2-3 drinks a week  . Drug Use: No  . Sexual Activity: Not on file   Other Topics Concern  . Not on file   Social History Narrative    Allergies  Allergen Reactions  . Tetracycline     REACTION: rash, passed out   Constitutional: Positive headache, fatigue. Denies fever, abrupt weight changes.  HEENT:  Positive sore throat and left ear pain. Denies eye redness, eye pain, pressure behind the  eyes, facial pain, nasal congestion, ringing in the ears, wax buildup, runny nose or bloody nose. Respiratory: Positive cough. Green-brown sputum. Denies difficulty breathing or shortness of breath.  Cardiovascular: Positive palpitations - WPW; Denies chest pain, chest tightness or swelling in the hands or feet.   No other specific complaints in a complete review of systems (except as listed in HPI above).  Objective:   BP 110/70 mmHg  Pulse 102  Temp(Src) 98.5 F (36.9 C) (Oral)  Wt 150 lb (68.04 kg)  SpO2 99%  LMP 11/07/2010 Wt Readings from Last 3 Encounters:  05/17/14 150 lb (68.04 kg)  01/12/14 156 lb 8 oz (70.988 kg)  07/31/13 156 lb 6.4 oz (70.943 kg)   General: Appears her stated age, well developed, well nourished in NAD. 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  Neck: No adenopathy noted. Cardiovascular: Tachycardic. S1,S2 noted.  No murmur, rubs or gallops noted. No JVD or  BLE edema. Pulmonary/Chest: Normal effort and positive vesicular breath sounds. No respiratory distress. No wheezes, rales or ronchi noted.      Assessment & Plan:   Upper Respiratory Infection, likely viral at this point:  POCT rapid strep A - Negative Get some rest and drink plenty of water Do salt water gargles for the sore throat Shot of depo-medrol injection 80 mg Ibuprofen PRN for pain/fever eRx for Hycodan cough syrup Printed Azithromax x 5 days - to start over the weekend if not improved   RTC as needed or if symptoms persist.

## 2014-05-17 NOTE — Progress Notes (Signed)
Pre visit review using our clinic review tool, if applicable. No additional management support is needed unless otherwise documented below in the visit note. 

## 2014-09-06 ENCOUNTER — Ambulatory Visit (INDEPENDENT_AMBULATORY_CARE_PROVIDER_SITE_OTHER): Payer: Managed Care, Other (non HMO) | Admitting: Family Medicine

## 2014-09-06 ENCOUNTER — Encounter: Payer: Self-pay | Admitting: Family Medicine

## 2014-09-06 VITALS — BP 110/74 | HR 81 | Temp 98.5°F | Wt 139.5 lb

## 2014-09-06 DIAGNOSIS — M25511 Pain in right shoulder: Secondary | ICD-10-CM | POA: Diagnosis not present

## 2014-09-06 NOTE — Patient Instructions (Signed)
Use the exercises and take up to 600mg  of ibuprofen up to 3 times a day with food.  If not better, then schedule a follow up with Dr. Lorelei Pont.  Take care.  Glad to see you.

## 2014-09-06 NOTE — Progress Notes (Signed)
Pre visit review using our clinic review tool, if applicable. No additional management support is needed unless otherwise documented below in the visit note.  R shoulder pain.  Started about 1 month ago.  Posterior pain.  Nagging, not getting better.  Pain with ext rotation, pain with overhead motion.  Pain at night, laying on her arm.  She is much worse, just not better.  R handed.  No L shoulder sx.  No trauma.  No rash, no bruising.  No R elbow or wrist sx. Grip wnl.  Taking tiZANidine, with some help sleeping.  Hasn't tried nsaids, etc.    Meds, vitals, and allergies reviewed.   ROS: See HPI.  Otherwise, noncontributory.  nad ncat Normal neck rom Normal R elbow and wrist rom Pain with int and ext rotation R shoulder, + impingement but no arm drop.   R scap assist with improvement in int/ext rotation AC not ttp Distally nv intact

## 2014-09-07 DIAGNOSIS — M25519 Pain in unspecified shoulder: Secondary | ICD-10-CM | POA: Insufficient documentation

## 2014-09-07 NOTE — Assessment & Plan Note (Signed)
Likely cuff sx, d/w pt.  No arm drop.  With scap assist pos, would use home exercise/rehab routine and see how much she can improve.  Ibuprofen with routine cautions.  F/u prn.   She may need to see Dr. Lorelei Pont re: injection if not better.  She will try the above first.  Anatomy and plan d/w pt.

## 2014-09-11 ENCOUNTER — Encounter: Payer: Self-pay | Admitting: Gastroenterology

## 2015-01-07 ENCOUNTER — Ambulatory Visit: Payer: Managed Care, Other (non HMO) | Admitting: Family Medicine

## 2015-01-07 ENCOUNTER — Encounter: Payer: Self-pay | Admitting: Family Medicine

## 2015-01-07 ENCOUNTER — Ambulatory Visit (INDEPENDENT_AMBULATORY_CARE_PROVIDER_SITE_OTHER): Payer: Managed Care, Other (non HMO) | Admitting: Family Medicine

## 2015-01-07 VITALS — BP 148/94 | HR 75 | Temp 98.8°F | Ht 61.0 in | Wt 149.5 lb

## 2015-01-07 DIAGNOSIS — J029 Acute pharyngitis, unspecified: Secondary | ICD-10-CM | POA: Diagnosis not present

## 2015-01-07 DIAGNOSIS — B9689 Other specified bacterial agents as the cause of diseases classified elsewhere: Secondary | ICD-10-CM | POA: Insufficient documentation

## 2015-01-07 DIAGNOSIS — J019 Acute sinusitis, unspecified: Secondary | ICD-10-CM | POA: Diagnosis not present

## 2015-01-07 LAB — POCT RAPID STREP A (OFFICE): Rapid Strep A Screen: NEGATIVE

## 2015-01-07 MED ORDER — AMOXICILLIN-POT CLAVULANATE 875-125 MG PO TABS: 1.0000 | ORAL_TABLET | Freq: Two times a day (BID) | ORAL | Status: DC

## 2015-01-07 NOTE — Assessment & Plan Note (Signed)
S/p uri and also with seasonal allergies Cover with augmentin  Disc symptomatic care - see instructions on AVS  Update if not starting to improve in a week or if worsening

## 2015-01-07 NOTE — Progress Notes (Signed)
Subjective:    Patient ID: Felicia Robbins, female    DOB: 11/24/1955, 59 y.o.   MRN: [id number]  HPI Here for sore throat   Started getting this Saturday - mild ST  Then by Sunday night - exhausted and very sore throat  Has a headache and sinuses hurt (facial pain) Runny and stuffy nose (congested since Friday)  D/c is a little bit yellow   No fever  Some chills and sweats however   No cough   Zyrtec over the counter - (of note has had sinus headache for 3 weeks on and off)  Also some tylenol     Results for orders placed or performed in visit on 01/07/15  Rapid Strep A  Result Value Ref Range   Rapid Strep A Screen Negative Negative     Patient Active Problem List   Diagnosis Date Noted  . Pain in joint, shoulder region 09/07/2014  . Post-menopausal bleeding 01/12/2014  . Rib pain on right side 01/12/2014  . Scratch of face 01/12/2014  . Left breast mass 03/28/2013  . Viral URI 02/27/2013  . Nausea with vomiting 02/27/2013  . Infected cat scratch of forearm 06/07/2012  . Stress reaction, emotional 01/19/2012  . Trochanteric bursitis of both hips 08/03/2011  . Vertigo 12/11/2010  . Gynecologic exam normal 12/08/2010  . Routine general medical examination at a health care facility 11/29/2010  . Hot flashes 08/11/2010  . RHINITIS 08/15/2007  . FIBROCYSTIC BREAST DISEASE 09/24/2006  . HX, PERSONAL, COLONIC POLYPS 09/24/2006  . MIGRAINE, COMMON 08/25/2006  . DECREASED HEARING 08/25/2006  . HYPERTENSION, ESSENTIAL NOS 08/25/2006  . Esophageal reflux 08/25/2006  . MIGRAINES, HX OF 08/25/2006   Past Medical History  Diagnosis Date  . Depression   . GERD (gastroesophageal reflux disease)   . Headache(784.0)   . HOH (hard of hearing)     left-has inplant  . WPW (Wolff-Parkinson-White syndrome)     See scanned history note   Past Surgical History  Procedure Laterality Date  . Breast surgery      Lt breast nodule - neg.  . 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 Tubal ligation    . Biopsy bowel      small bowel biopsy- normal (colon plyps / colonoscopy 03/1999)  . Inner ear surgery  2011    Multiple surgeries Lt ear. Hearing device implanted Lt ear 2011.  Marland Kitchen Cholecystectomy  2005    lap choli  . Breast biopsy Left 07/13/2013    Procedure: BREAST BIOPSY WITH NEEDLE LOCALIZATION;  Surgeon: Marcello Moores A. Cornett, MD;  Location: [place];  Service: General;  Laterality: Left;   Social History  Substance Use Topics  . Smoking status: Former Smoker    Quit date: 04/20/1986  . Smokeless tobacco: Never Used  . Alcohol Use: 0.0 oz/week    0 Standard drinks or equivalent per week     Comment: 2-3 drinks a week   Family History  Problem Relation Age of Onset  . Cancer Mother     breast  . Diabetes Mother   . Stroke Mother   . Multiple sclerosis Mother   . Crohn's disease Brother    Allergies  Allergen Reactions  . Tetracycline     REACTION: rash, passed out   Current Outpatient Prescriptions on File Prior to Visit  Medication Sig Dispense Refill  . tiZANidine (ZANAFLEX) 4 MG tablet Take 1 tablet (4 mg total) by mouth at  bedtime as needed. 1/2 - 1 tablet by mouth four times a day as needed for headache. 30 tablet 3   No current facility-administered medications on file prior to visit.    Review of Systems    Review of Systems  Constitutional: Negative for fever, appetite change,  and unexpected weight change.  ENT pos for cong/rhinorrhea and sinus pain and ST Eyes: Negative for pain and visual disturbance.  Respiratory: Negative for cough and shortness of breath.   Cardiovascular: Negative for cp or palpitations    Gastrointestinal: Negative for nausea, diarrhea and constipation.  Genitourinary: Negative for urgency and frequency.  Skin: Negative for pallor or rash   Neurological: Negative for weakness, light-headedness, numbness and headaches.  Hematological: Negative for adenopathy. Does not bruise/bleed  easily.  Psychiatric/Behavioral: Negative for dysphoric mood. The patient is not nervous/anxious.      Objective:   Physical Exam  Constitutional: She appears well-developed and well-nourished. No distress.  overwt and well app  HENT:  Head: Normocephalic and atraumatic.  Right Ear: External ear normal.  Mouth/Throat: Oropharynx is clear and moist. No oropharyngeal exudate.  Nares are injected and congested  Bilateral maxillary sinus tenderness  Post nasal drip   Baseline L ear deformity   New cochlear implant button noted   Eyes: Conjunctivae and EOM are normal. Pupils are equal, round, and reactive to light. Right eye exhibits no discharge. Left eye exhibits no discharge.  Neck: Normal range of motion. Neck supple.  Cardiovascular: Normal rate and regular rhythm.   Pulmonary/Chest: Effort normal and breath sounds normal. No respiratory distress. She has no wheezes. She has no rales.  Lymphadenopathy:    She has no cervical adenopathy.  Neurological: She is alert. No cranial nerve deficit.  Skin: Skin is warm and dry. No rash noted.  Psychiatric: She has a normal mood and affect.          Assessment & Plan:   Problem List Items Addressed This Visit      Respiratory   Acute bacterial sinusitis    S/p uri and also with seasonal allergies Cover with augmentin  Disc symptomatic care - see instructions on AVS  Update if not starting to improve in a week or if worsening        Relevant Medications   amoxicillin-clavulanate (AUGMENTIN) 875-125 MG per tablet    Other Visit Diagnoses    Sore throat    -  Primary    Relevant Orders    Rapid Strep A (Completed)

## 2015-01-07 NOTE — Progress Notes (Signed)
Pre visit review using our clinic review tool, if applicable. No additional management support is needed unless otherwise documented below in the visit note. 

## 2015-01-07 NOTE — Patient Instructions (Signed)
Drink lots of fluids  mucinex and nasal saline may help with congestion  Continue zyrtec for allergies Take augmentin as directed  Update if not starting to improve in a week or if worsening

## 2015-01-28 ENCOUNTER — Telehealth: Payer: Self-pay | Admitting: Family Medicine

## 2015-01-28 DIAGNOSIS — Z0000 Encounter for general adult medical examination without abnormal findings: Secondary | ICD-10-CM

## 2015-01-28 NOTE — Telephone Encounter (Signed)
-----   Message from Ellamae Sia sent at 01/23/2015  5:10 PM EDT ----- Regarding: Lab orders for Tuesday, 10.11.16 Patient is scheduled for CPX labs, please order future labs, Thanks , Karna Christmas

## 2015-01-29 ENCOUNTER — Other Ambulatory Visit (INDEPENDENT_AMBULATORY_CARE_PROVIDER_SITE_OTHER): Payer: Managed Care, Other (non HMO)

## 2015-01-29 DIAGNOSIS — Z0000 Encounter for general adult medical examination without abnormal findings: Secondary | ICD-10-CM | POA: Diagnosis not present

## 2015-01-29 LAB — LIPID PANEL
CHOLESTEROL: 234 mg/dL — AB (ref 0–200)
HDL: 84.4 mg/dL (ref >39.00)
LDL CALC: 130 mg/dL — AB (ref 0–99)
NonHDL: 149.5
TRIGLYCERIDES: 97 mg/dL (ref 0.0–149.0)
Total CHOL/HDL Ratio: 3
VLDL: 19.4 mg/dL (ref 0.0–40.0)

## 2015-01-29 LAB — CBC WITH DIFFERENTIAL/PLATELET
BASOS ABS: 0 10*3/uL (ref 0.0–0.1)
Basophils Relative: 0.4 % (ref 0.0–3.0)
EOS ABS: 0.2 10*3/uL (ref 0.0–0.7)
Eosinophils Relative: 3.6 % (ref 0.0–5.0)
HCT: 40.5 % (ref 36.0–46.0)
HEMOGLOBIN: 13.4 g/dL (ref 12.0–15.0)
Lymphocytes Relative: 31.9 % (ref 12.0–46.0)
Lymphs Abs: 1.6 10*3/uL (ref 0.7–4.0)
MCHC: 33.1 g/dL (ref 30.0–36.0)
MCV: 92 fl (ref 78.0–100.0)
MONO ABS: 0.5 10*3/uL (ref 0.1–1.0)
Monocytes Relative: 9.4 % (ref 3.0–12.0)
Neutro Abs: 2.8 10*3/uL (ref 1.4–7.7)
Neutrophils Relative %: 54.7 % (ref 43.0–77.0)
Platelets: 253 10*3/uL (ref 150.0–400.0)
RBC: 4.4 Mil/uL (ref 3.87–5.11)
RDW: 13.6 % (ref 11.5–15.5)
WBC: 5.1 10*3/uL (ref 4.0–10.5)

## 2015-01-29 LAB — COMPREHENSIVE METABOLIC PANEL
ALBUMIN: 4.2 g/dL (ref 3.5–5.2)
ALK PHOS: 72 U/L (ref 39–117)
ALT: 13 U/L (ref 0–35)
AST: 18 U/L (ref 0–37)
BUN: 11 mg/dL (ref 6–23)
CO2: 27 mEq/L (ref 19–32)
Calcium: 9.9 mg/dL (ref 8.4–10.5)
Chloride: 105 mEq/L (ref 96–112)
Creatinine, Ser: 0.66 mg/dL (ref 0.40–1.20)
GFR: 97.67 mL/min (ref >60.00)
Glucose, Bld: 101 mg/dL — ABNORMAL HIGH (ref 70–99)
POTASSIUM: 4.2 meq/L (ref 3.5–5.1)
Sodium: 142 mEq/L (ref 135–145)
Total Bilirubin: 0.7 mg/dL (ref 0.2–1.2)
Total Protein: 7.2 g/dL (ref 6.0–8.3)

## 2015-01-29 LAB — TSH: TSH: 0.7 u[IU]/mL (ref 0.35–4.50)

## 2015-02-04 ENCOUNTER — Ambulatory Visit (INDEPENDENT_AMBULATORY_CARE_PROVIDER_SITE_OTHER): Payer: Managed Care, Other (non HMO) | Admitting: Family Medicine

## 2015-02-04 ENCOUNTER — Encounter: Payer: Self-pay | Admitting: Family Medicine

## 2015-02-04 VITALS — BP 150/86 | HR 76 | Temp 98.6°F | Ht 60.75 in | Wt 150.0 lb

## 2015-02-04 DIAGNOSIS — Z0000 Encounter for general adult medical examination without abnormal findings: Secondary | ICD-10-CM

## 2015-02-04 DIAGNOSIS — I10 Essential (primary) hypertension: Secondary | ICD-10-CM

## 2015-02-04 MED ORDER — TIZANIDINE HCL 4 MG PO TABS: 4.0000 mg | ORAL_TABLET | Freq: Every evening | ORAL | Status: DC | PRN

## 2015-02-04 MED ORDER — ATENOLOL 50 MG PO TABS: 50.0000 mg | ORAL_TABLET | Freq: Every day | ORAL | Status: DC

## 2015-02-04 NOTE — Patient Instructions (Signed)
Get back on atenolol for high blood pressure  See the DASH eating plan handout  Get back to better habits and exercise when you can  Take care of yourself

## 2015-02-04 NOTE — Assessment & Plan Note (Signed)
bp is up last several readings Will re start atenolol (prev for headaches but helped bp and well tolerated) BP Readings from Last 1 Encounters:  02/04/15 150/86   Disc lifstyle change with low sodium diet and exercise  Will re check at upcoming gyn appt  Update if side eff

## 2015-02-04 NOTE — Assessment & Plan Note (Signed)
Reviewed health habits including diet and exercise and skin cancer prevention Reviewed appropriate screening tests for age  Also reviewed health mt list, fam hx and immunization status , as well as social and family history   See HPI Labs reviewed  

## 2015-02-04 NOTE — Progress Notes (Signed)
Subjective:    Patient ID: Felicia Robbins, female    DOB: 01-Jan-1956, 59 y.o.   MRN: [id number]  HPI Here for health maintenance exam and to review chronic medical problems    Has a sinus headache today  Otherwise doing ok   Wt is stable with bmi of 28 She lost some and gained it back (to 132) Was practicing mindful eating and it worked  Habits are not the best - busy  Not doing a lot of exercise   Was having some shoulder problems - getting slowly better  Put her gym membership on hold    Hep C/HIV screen- declines / not high risk She donates blood also   Mm 12/15 has fibrocystic breast cysts  Self exam - no new lumps  Fibrocystic breasts   Pap nl 12/15 - gyn - Dr Georgia Lopes 9/09 -10 years/no polyps that time   Td 2/14  Had flu shot 12/28/14 at work   bp is up today- thinks may be since she has a sinus headache  It was slt elevated at ENT the last time  No cp or palpitations or headaches or edema  No side effects to medicines  BP Readings from Last 3 Encounters:  02/04/15 150/86  01/07/15 148/94  09/06/14 110/74      Results for orders placed or performed in visit on 01/29/15  Comprehensive metabolic panel  Result Value Ref Range   Sodium 142 135 - 145 mEq/L   Potassium 4.2 3.5 - 5.1 mEq/L   Chloride 105 96 - 112 mEq/L   CO2 27 19 - 32 mEq/L   Glucose, Bld 101 (H) 70 - 99 mg/dL   BUN 11 6 - 23 mg/dL   Creatinine, Ser 0.66 0.40 - 1.20 mg/dL   Total Bilirubin 0.7 0.2 - 1.2 mg/dL   Alkaline Phosphatase 72 39 - 117 U/L   AST 18 0 - 37 U/L   ALT 13 0 - 35 U/L   Total Protein 7.2 6.0 - 8.3 g/dL   Albumin 4.2 3.5 - 5.2 g/dL   Calcium 9.9 8.4 - 10.5 mg/dL   GFR 97.67 >60.00 mL/min  CBC with Differential/Platelet  Result Value Ref Range   WBC 5.1 4.0 - 10.5 K/uL   RBC 4.40 3.87 - 5.11 Mil/uL   Hemoglobin 13.4 12.0 - 15.0 g/dL   HCT 40.5 36.0 - 46.0 %   MCV 92.0 78.0 - 100.0 fl   MCHC 33.1 30.0 - 36.0 g/dL   RDW 13.6 11.5 - 15.5 %   Platelets  253.0 150.0 - 400.0 K/uL   Neutrophils Relative % 54.7 43.0 - 77.0 %   Lymphocytes Relative 31.9 12.0 - 46.0 %   Monocytes Relative 9.4 3.0 - 12.0 %   Eosinophils Relative 3.6 0.0 - 5.0 %   Basophils Relative 0.4 0.0 - 3.0 %   Neutro Abs 2.8 1.4 - 7.7 K/uL   Lymphs Abs 1.6 0.7 - 4.0 K/uL   Monocytes Absolute 0.5 0.1 - 1.0 K/uL   Eosinophils Absolute 0.2 0.0 - 0.7 K/uL   Basophils Absolute 0.0 0.0 - 0.1 K/uL  Lipid panel  Result Value Ref Range   Cholesterol 234 (H) 0 - 200 mg/dL   Triglycerides 97.0 0.0 - 149.0 mg/dL   HDL 84.40 >39.00 mg/dL   VLDL 19.4 0.0 - 40.0 mg/dL   LDL Cholesterol 130 (H) 0 - 99 mg/dL   Total CHOL/HDL Ratio 3    NonHDL 149.50   TSH  Result Value Ref Range   TSH 0.70 0.35 - 4.50 uIU/mL     Very good HDL!    Patient Active Problem List   Diagnosis Date Noted  . Acute bacterial sinusitis 01/07/2015  . Pain in joint, shoulder region 09/07/2014  . Post-menopausal bleeding 01/12/2014  . Rib pain on right side 01/12/2014  . Scratch of face 01/12/2014  . Left breast mass 03/28/2013  . Viral URI 02/27/2013  . Nausea with vomiting 02/27/2013  . Infected cat scratch of forearm 06/07/2012  . Stress reaction, emotional 01/19/2012  . Trochanteric bursitis of both hips 08/03/2011  . Vertigo 12/11/2010  . Gynecologic exam normal 12/08/2010  . Routine general medical examination at a health care facility 11/29/2010  . Hot flashes 08/11/2010  . RHINITIS 08/15/2007  . FIBROCYSTIC BREAST DISEASE 09/24/2006  . HX, PERSONAL, COLONIC POLYPS 09/24/2006  . MIGRAINE, COMMON 08/25/2006  . DECREASED HEARING 08/25/2006  . Essential hypertension 08/25/2006  . Esophageal reflux 08/25/2006  . MIGRAINES, HX OF 08/25/2006   Past Medical History  Diagnosis Date  . Depression   . GERD (gastroesophageal reflux disease)   . Headache(784.0)   . HOH (hard of hearing)     left-has inplant  . WPW (Wolff-Parkinson-White syndrome)     See scanned history note   Past  Surgical History  Procedure Laterality Date  . Breast surgery      Lt breast nodule - neg.  . 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 Tubal ligation    . Biopsy bowel      small bowel biopsy- normal (colon plyps / colonoscopy 03/1999)  . Inner ear surgery  2011    Multiple surgeries Lt ear. Hearing device implanted Lt ear 2011.  Marland Kitchen Cholecystectomy  2005    lap choli  . Breast biopsy Left 07/13/2013    Procedure: BREAST BIOPSY WITH NEEDLE LOCALIZATION;  Surgeon: Marcello Moores A. Cornett, MD;  Location: [facility];  Service: General;  Laterality: Left;   Social History  Substance Use Topics  . Smoking status: Former Smoker    Quit date: 04/20/1986  . Smokeless tobacco: Never Used  . Alcohol Use: 0.0 oz/week    0 Standard drinks or equivalent per week     Comment: 2-3 drinks a week   Family History  Problem Relation Age of Onset  . Cancer Mother     breast  . Diabetes Mother   . Stroke Mother   . Multiple sclerosis Mother   . Crohn's disease Brother    Allergies  Allergen Reactions  . Tetracycline     REACTION: rash, passed out   Current Outpatient Prescriptions on File Prior to Visit  Medication Sig Dispense Refill  . tiZANidine (ZANAFLEX) 4 MG tablet Take 1 tablet (4 mg total) by mouth at bedtime as needed. 1/2 - 1 tablet by mouth four times a day as needed for headache. 30 tablet 3   No current facility-administered medications on file prior to visit.    Review of Systems Review of Systems  Constitutional: Negative for fever, appetite change, fatigue and unexpected weight change.  ENT pos for sinus headache/allergy symptoms Eyes: Negative for pain and visual disturbance.  Respiratory: Negative for cough and shortness of breath.   Cardiovascular: Negative for cp or palpitations    Gastrointestinal: Negative for nausea, diarrhea and constipation.  Genitourinary: Negative for urgency and frequency.  Skin: Negative for pallor or rash   MSK pos for R  shoulder pain  that is gradually improving  Neurological: Negative for weakness, light-headedness, numbness and headaches.  Hematological: Negative for adenopathy. Does not bruise/bleed easily.  Psychiatric/Behavioral: Negative for dysphoric mood. The patient is not nervous/anxious.         Objective:   Physical Exam  Constitutional: She appears well-developed and well-nourished. No distress.  overwt and well app  HENT:  Head: Normocephalic and atraumatic.  Right Ear: External ear normal.  Left Ear: External ear normal.  Mouth/Throat: Oropharynx is clear and moist.  Cochlear implant site in head post to L ear   Nares are boggy  Eyes: Conjunctivae and EOM are normal. Pupils are equal, round, and reactive to light. No scleral icterus.  Neck: Normal range of motion. Neck supple. No JVD present. Carotid bruit is not present. No thyromegaly present.  Cardiovascular: Normal rate, regular rhythm, normal heart sounds and intact distal pulses.  Exam reveals no gallop.   Pulmonary/Chest: Effort normal and breath sounds normal. No respiratory distress. She has no wheezes. She exhibits no tenderness.  Abdominal: Soft. Bowel sounds are normal. She exhibits no distension, no abdominal bruit and no mass. There is no tenderness.  Genitourinary: No breast swelling, tenderness, discharge or bleeding.  Breast exam: No mass, nodules, thickening, tenderness, bulging, retraction, inflamation, nipple discharge or skin changes noted.  No axillary or clavicular LA.    Dense breast tissue bilaterally  Musculoskeletal: Normal range of motion. She exhibits no edema or tenderness.  Lymphadenopathy:    She has no cervical adenopathy.  Neurological: She is alert. She has normal reflexes. No cranial nerve deficit. She exhibits normal muscle tone. Coordination normal.  Skin: Skin is warm and dry. No rash noted. No erythema. No pallor.  Solar lentigo diffusely  Psychiatric: She has a normal mood and affect.           Assessment & Plan:   Problem List Items Addressed This Visit      Cardiovascular and Mediastinum   Essential hypertension - Primary    bp is up last several readings Will re start atenolol (prev for headaches but helped bp and well tolerated) BP Readings from Last 1 Encounters:  02/04/15 150/86   Disc lifstyle change with low sodium diet and exercise  Will re check at upcoming gyn appt  Update if side eff        Relevant Medications   atenolol (TENORMIN) 50 MG tablet     Other   Routine general medical examination at a health care facility    Reviewed health habits including diet and exercise and skin cancer prevention Reviewed appropriate screening tests for age  Also reviewed health mt list, fam hx and immunization status , as well as social and family history   See HPI Labs reviewed

## 2015-02-04 NOTE — Progress Notes (Signed)
Pre visit review using our clinic review tool, if applicable. No additional management support is needed unless otherwise documented below in the visit note. 

## 2015-03-22 LAB — HM PAP SMEAR: HM PAP: NORMAL

## 2015-03-22 LAB — HM MAMMOGRAPHY: HM MAMMO: NORMAL (ref 0–4)

## 2015-04-04 ENCOUNTER — Ambulatory Visit (INDEPENDENT_AMBULATORY_CARE_PROVIDER_SITE_OTHER)
Admission: RE | Admit: 2015-04-04 | Discharge: 2015-04-04 | Disposition: A | Discharge status: Home / Self Care | Payer: Managed Care, Other (non HMO) | Source: Ambulatory Visit | Attending: Primary Care | Admitting: Primary Care

## 2015-04-04 ENCOUNTER — Encounter: Payer: Self-pay | Admitting: Primary Care

## 2015-04-04 ENCOUNTER — Ambulatory Visit (INDEPENDENT_AMBULATORY_CARE_PROVIDER_SITE_OTHER): Payer: Managed Care, Other (non HMO) | Admitting: Primary Care

## 2015-04-04 ENCOUNTER — Other Ambulatory Visit: Payer: Self-pay | Admitting: Primary Care

## 2015-04-04 VITALS — BP 142/88 | HR 110 | Temp 100.0°F | Ht 60.75 in | Wt 152.4 lb

## 2015-04-04 DIAGNOSIS — J189 Pneumonia, unspecified organism: Secondary | ICD-10-CM

## 2015-04-04 DIAGNOSIS — R05 Cough: Secondary | ICD-10-CM

## 2015-04-04 DIAGNOSIS — R112 Nausea with vomiting, unspecified: Secondary | ICD-10-CM

## 2015-04-04 DIAGNOSIS — R059 Cough, unspecified: Secondary | ICD-10-CM

## 2015-04-04 LAB — CBC WITH DIFFERENTIAL/PLATELET
BASOS ABS: 0 10*3/uL (ref 0.0–0.1)
Basophils Relative: 0.3 % (ref 0.0–3.0)
EOS ABS: 0 10*3/uL (ref 0.0–0.7)
Eosinophils Relative: 0.3 % (ref 0.0–5.0)
HEMATOCRIT: 41.2 % (ref 36.0–46.0)
HEMOGLOBIN: 13.7 g/dL (ref 12.0–15.0)
LYMPHS PCT: 13.1 % (ref 12.0–46.0)
Lymphs Abs: 1.2 10*3/uL (ref 0.7–4.0)
MCHC: 33.3 g/dL (ref 30.0–36.0)
MCV: 90.8 fl (ref 78.0–100.0)
MONOS PCT: 8 % (ref 3.0–12.0)
Monocytes Absolute: 0.7 10*3/uL (ref 0.1–1.0)
Neutro Abs: 7.1 10*3/uL (ref 1.4–7.7)
Neutrophils Relative %: 78.3 % — ABNORMAL HIGH (ref 43.0–77.0)
PLATELETS: 226 10*3/uL (ref 150.0–400.0)
RBC: 4.53 Mil/uL (ref 3.87–5.11)
RDW: 13.7 % (ref 11.5–15.5)
WBC: 9.1 10*3/uL (ref 4.0–10.5)

## 2015-04-04 LAB — COMPREHENSIVE METABOLIC PANEL
ALK PHOS: 89 U/L (ref 39–117)
ALT: 30 U/L (ref 0–35)
AST: 39 U/L — AB (ref 0–37)
Albumin: 4.2 g/dL (ref 3.5–5.2)
BILIRUBIN TOTAL: 0.3 mg/dL (ref 0.2–1.2)
BUN: 10 mg/dL (ref 6–23)
CALCIUM: 9.4 mg/dL (ref 8.4–10.5)
CO2: 29 meq/L (ref 19–32)
CREATININE: 0.68 mg/dL (ref 0.40–1.20)
Chloride: 101 mEq/L (ref 96–112)
GFR: 94.31 mL/min (ref >60.00)
Glucose, Bld: 110 mg/dL — ABNORMAL HIGH (ref 70–99)
Potassium: 4.1 mEq/L (ref 3.5–5.1)
Sodium: 138 mEq/L (ref 135–145)
TOTAL PROTEIN: 7.9 g/dL (ref 6.0–8.3)

## 2015-04-04 LAB — POCT INFLUENZA A/B
INFLUENZA A, POC: NEGATIVE
INFLUENZA B, POC: NEGATIVE

## 2015-04-04 MED ORDER — LEVOFLOXACIN 500 MG PO TABS: 500.0000 mg | ORAL_TABLET | Freq: Every day | ORAL | Status: DC

## 2015-04-04 MED ORDER — ONDANSETRON 4 MG PO TBDP: 4.0000 mg | ORAL_TABLET | Freq: Three times a day (TID) | ORAL | Status: DC | PRN

## 2015-04-04 NOTE — Progress Notes (Signed)
Pre visit review using our clinic review tool, if applicable. No additional management support is needed unless otherwise documented below in the visit note. 

## 2015-04-04 NOTE — Patient Instructions (Signed)
Your flu test was negative.  Complete xray(s) prior to leaving today. I will notify you of your results once received.  Complete lab work prior to leaving today. I will notify you of your results once received.   I've sent ondansetron (Zofran) tablets to your pharmacy to use as needed for nausea. Place 1 tablet in your mouth every 8 hours as needed for nausea/vomiting.  Increase consumption of fluids slowly. I'll be in touch with you later today.  It was a pleasure meeting you!  Upper Respiratory Infection, Adult Most upper respiratory infections (URIs) are a viral infection of the air passages leading to the lungs. A URI affects the nose, throat, and upper air passages. The most common type of URI is nasopharyngitis and is typically referred to as "the common cold." URIs run their course and usually go away on their own. Most of the time, a URI does not require medical attention, but sometimes a bacterial infection in the upper airways can follow a viral infection. This is called a secondary infection. Sinus and middle ear infections are common types of secondary upper respiratory infections. Bacterial pneumonia can also complicate a URI. A URI can worsen asthma and chronic obstructive pulmonary disease (COPD). Sometimes, these complications can require emergency medical care and may be life threatening.  CAUSES Almost all URIs are caused by viruses. A virus is a type of germ and can spread from one person to another.  RISKS FACTORS You may be at risk for a URI if:   You smoke.   You have chronic heart or lung disease.  You have a weakened defense (immune) system.   You are very young or very old.   You have nasal allergies or asthma.  You work in crowded or poorly ventilated areas.  You work in health care facilities or schools. SIGNS AND SYMPTOMS  Symptoms typically develop 2-3 days after you come in contact with a cold virus. Most viral URIs last 7-10 days. However, viral  URIs from the influenza virus (flu virus) can last 14-18 days and are typically more severe. Symptoms may include:   Runny or stuffy (congested) nose.   Sneezing.   Cough.   Sore throat.   Headache.   Fatigue.   Fever.   Loss of appetite.   Pain in your forehead, behind your eyes, and over your cheekbones (sinus pain).  Muscle aches.  DIAGNOSIS  Your health care provider may diagnose a URI by:  Physical exam.  Tests to check that your symptoms are not due to another condition such as:  Strep throat.  Sinusitis.  Pneumonia.  Asthma. TREATMENT  A URI goes away on its own with time. It cannot be cured with medicines, but medicines may be prescribed or recommended to relieve symptoms. Medicines may help:  Reduce your fever.  Reduce your cough.  Relieve nasal congestion. HOME CARE INSTRUCTIONS   Take medicines only as directed by your health care provider.   Gargle warm saltwater or take cough drops to comfort your throat as directed by your health care provider.  Use a warm mist humidifier or inhale steam from a shower to increase air moisture. This may make it easier to breathe.  Drink enough fluid to keep your urine clear or pale yellow.   Eat soups and other clear broths and maintain good nutrition.   Rest as needed.   Return to work when your temperature has returned to normal or as your health care provider advises. You may need  to stay home longer to avoid infecting others. You can also use a face mask and careful hand washing to prevent spread of the virus.  Increase the usage of your inhaler if you have asthma.   Do not use any tobacco products, including cigarettes, chewing tobacco, or electronic cigarettes. If you need help quitting, ask your health care provider. PREVENTION  The best way to protect yourself from getting a cold is to practice good hygiene.   Avoid oral or hand contact with people with cold symptoms.   Wash your  hands often if contact occurs.  There is no clear evidence that vitamin C, vitamin E, echinacea, or exercise reduces the chance of developing a cold. However, it is always recommended to get plenty of rest, exercise, and practice good nutrition.  SEEK MEDICAL CARE IF:   You are getting worse rather than better.   Your symptoms are not controlled by medicine.   You have chills.  You have worsening shortness of breath.  You have brown or red mucus.  You have yellow or brown nasal discharge.  You have pain in your face, especially when you bend forward.  You have a fever.  You have swollen neck glands.  You have pain while swallowing.  You have white areas in the back of your throat. SEEK IMMEDIATE MEDICAL CARE IF:   You have severe or persistent:  Headache.  Ear pain.  Sinus pain.  Chest pain.  You have chronic lung disease and any of the following:  Wheezing.  Prolonged cough.  Coughing up blood.  A change in your usual mucus.  You have a stiff neck.  You have changes in your:  Vision.  Hearing.  Thinking.  Mood. MAKE SURE YOU:   Understand these instructions.  Will watch your condition.  Will get help right away if you are not doing well or get worse.   This information is not intended to replace advice given to you by your health care provider. Make sure you discuss any questions you have with your health care provider.   Document Released: 09/30/2000 Document Revised: 08/21/2014 Document Reviewed: 07/12/2013 Elsevier Interactive Patient Education [employer].

## 2015-04-04 NOTE — Progress Notes (Signed)
Subjective:    Patient ID: Felicia Robbins, female    DOB: 06-05-1955, 59 y.o.   MRN: [id number]  HPI  Felicia Robbins is a 59 year old female who presents today with a chief complaint of cough. Her symptoms also include chills, weakness, sinus pressure, nausea, vomiting, diarrhea. Her cough is productive with yellow sputum. Her symptoms have been present since Monday this week and began with a headache. Tuesday afternoon she had a sudden onset of fevers, chills, sinus pressure, cough, weakness. She's had 5 episodes of vomiting today, and several episodes of diarrhea over the past 2 days.   She's eaten 4 pieces of dry toast in the past 2 days as her appetite is depressed. Denies abdominal pain, describes it more as a discomfort. She's not had anything to eat and drink today. She's taken tylenol, 1 tablet of Zyrtec, Nyquil OTC without improvement.   Review of Systems  Constitutional: Positive for fever and chills.  HENT: Positive for congestion.   Respiratory: Positive for cough.   Gastrointestinal: Positive for nausea, vomiting and diarrhea. Negative for abdominal pain.       Past Medical History  Diagnosis Date  . Depression   . GERD (gastroesophageal reflux disease)   . Headache(784.0)   . HOH (hard of hearing)     left-has inplant  . WPW (Wolff-Parkinson-White syndrome)     See scanned history note    Social History   Social History  . Marital Status: Legally Separated    Spouse Name: N/A  . Number of Children: N/A  . Years of Education: N/A   Occupational History  . Not on file.   Social History Main Topics  . Smoking status: Former Smoker    Quit date: 04/20/1986  . Smokeless tobacco: Never Used  . Alcohol Use: 0.0 oz/week    0 Standard drinks or equivalent per week     Comment: 2-3 drinks a week  . Drug Use: No  . Sexual Activity: Not on file   Other Topics Concern  . Not on file   Social History Narrative    Past Surgical History  Procedure Laterality  Date  . Breast surgery      Lt breast nodule - neg.  . 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 Tubal ligation    . Biopsy bowel      small bowel biopsy- normal (colon plyps / colonoscopy 03/1999)  . Inner ear surgery  2011    Multiple surgeries Lt ear. Hearing device implanted Lt ear 2011.  Marland Kitchen Cholecystectomy  2005    lap choli  . Breast biopsy Left 07/13/2013    Procedure: BREAST BIOPSY WITH NEEDLE LOCALIZATION;  Surgeon: Marcello Moores A. Cornett, MD;  Location: [facility];  Service: General;  Laterality: Left;    Family History  Problem Relation Age of Onset  . Cancer Mother     breast  . Diabetes Mother   . Stroke Mother   . Multiple sclerosis Mother   . Crohn's disease Brother     Allergies  Allergen Reactions  . Tetracycline     REACTION: rash, passed out    Current Outpatient Prescriptions on File Prior to Visit  Medication Sig Dispense Refill  . atenolol (TENORMIN) 50 MG tablet Take 1 tablet (50 mg total) by mouth daily. 30 tablet 11  . tiZANidine (ZANAFLEX) 4 MG tablet Take 1 tablet (4 mg total) by mouth at bedtime as needed. 1/2 - 1  tablet by mouth four times a day as needed for headache. 30 tablet 11   No current facility-administered medications on file prior to visit.    BP 142/88 mmHg  Pulse 110  Temp(Src) 100 F (37.8 C) (Oral)  Ht 5' 0.75" (1.543 m)  Wt 152 lb 6.4 oz (69.128 kg)  BMI 29.04 kg/m2  SpO2 97%  LMP 11/07/2010    Objective:   Physical Exam  Constitutional: She has a sickly appearance.  HENT:  Right Ear: Ear canal normal.  Left Ear: Ear canal normal.  Nose: Mucosal edema present. Right sinus exhibits no maxillary sinus tenderness and no frontal sinus tenderness. Left sinus exhibits no maxillary sinus tenderness and no frontal sinus tenderness.  Mouth/Throat: Oropharynx is clear and moist.  Eyes: Conjunctivae are normal. Pupils are equal, round, and reactive to light.  Neck: Neck supple.  Cardiovascular: Regular rhythm.    Sinus tachycardia at 110.  Pulmonary/Chest: Effort normal. She has no decreased breath sounds. She has rhonchi in the left lower field.  Crackles to lung base on left side.  Lymphadenopathy:    She has no cervical adenopathy.          Assessment & Plan:  URI:  Sudden onset of chills, fever, nausea, cough Tuesday. Symptoms have progressed since with increased weakness, vomiting, diarrhea. Appears sick. Moderate dry cough during exam. Lungs with crackles/rhonchi to left lower lobe. Tachycardic. Rapid Flu: Negative Will obtain CBC, CMP, and chest xray today to rule out other causes such as pneumonia. Will treat with Zofran at this time and will send antibiotic pending xray if necessary.  Fluids, rest. Return precautions provided.

## 2015-04-04 NOTE — Addendum Note (Signed)
Addended by: Jacqualin Combes on: 04/04/2015 11:20 AM   Modules accepted: Orders

## 2015-04-05 ENCOUNTER — Telehealth: Payer: Self-pay

## 2015-04-05 ENCOUNTER — Encounter: Payer: Self-pay | Admitting: Primary Care

## 2015-04-05 DIAGNOSIS — R059 Cough, unspecified: Secondary | ICD-10-CM

## 2015-04-05 DIAGNOSIS — R05 Cough: Secondary | ICD-10-CM

## 2015-04-05 MED ORDER — HYDROCODONE-HOMATROPINE 5-1.5 MG/5ML PO SYRP: 5.0000 mL | ORAL_SOLUTION | Freq: Four times a day (QID) | ORAL | Status: DC | PRN

## 2015-04-05 NOTE — Telephone Encounter (Signed)
Called and notified patient Rx is ready for pick up. Left in front office.

## 2015-04-05 NOTE — Telephone Encounter (Signed)
Please notify Felicia Robbins that her prescription is ready for pick up at her convenience.

## 2015-04-05 NOTE — Telephone Encounter (Signed)
Pt left /vm;pt seen 04/04/15 with pneumonia; pt request rx for cough med with codeine. Pt request cb.

## 2015-11-11 ENCOUNTER — Telehealth: Payer: Self-pay | Admitting: Family Medicine

## 2015-11-11 NOTE — Telephone Encounter (Signed)
Left message on voicemail for patient to call the office back. 

## 2015-11-11 NOTE — Telephone Encounter (Signed)
[facility] Patient Name: Felicia Robbins DOB: May 11, 1955 Initial Comment Caller is dizzy and would like to be seen Nurse Assessment Guidelines Guideline Title Affirmed Question Affirmed Notes Final Disposition User FINAL ATTEMPT MADE - message left [place], RN, Dellis Filbert

## 2015-11-12 ENCOUNTER — Ambulatory Visit: Payer: Managed Care, Other (non HMO) | Admitting: Family Medicine

## 2015-11-12 NOTE — Telephone Encounter (Signed)
Thanks for letting me know, and I hope she feels better soon.  I will wait on the info from the other clinic.

## 2015-11-12 NOTE — Telephone Encounter (Signed)
Called and spoke to patient and was advised that she tried to get an appointment with Dr. Glori Bickers and was transferred to [facility] and did not have any luck talking with people there. Patient stated that she went to [facility] in [facility] on [address]. Patient stated that they did an EKG and lab work. Patient stated that the EKG was fine and they are suppose to call her with the lab results. Patient stated that the doctor thought that she was having vertigo and gave her a script for Meclizine. Patient stated that when they call her with lab results she will have them fax you a copy of the office notes, EKG and lab results.

## 2016-01-07 ENCOUNTER — Ambulatory Visit (INDEPENDENT_AMBULATORY_CARE_PROVIDER_SITE_OTHER): Payer: Managed Care, Other (non HMO) | Admitting: Primary Care

## 2016-01-07 ENCOUNTER — Encounter: Payer: Self-pay | Admitting: Primary Care

## 2016-01-07 VITALS — BP 144/88 | HR 86 | Temp 98.4°F | Ht 60.75 in | Wt 159.2 lb

## 2016-01-07 DIAGNOSIS — J069 Acute upper respiratory infection, unspecified: Secondary | ICD-10-CM

## 2016-01-07 MED ORDER — HYDROCODONE-HOMATROPINE 5-1.5 MG/5ML PO SYRP: 5.0000 mL | ORAL_SOLUTION | Freq: Every evening | ORAL | 0 refills | Status: DC | PRN

## 2016-01-07 NOTE — Progress Notes (Signed)
Pre visit review using our clinic review tool, if applicable. No additional management support is needed unless otherwise documented below in the visit note. 

## 2016-01-07 NOTE — Progress Notes (Signed)
Subjective:    Patient ID: Felicia Robbins, female    DOB: June 01, 1955, 61 y.o.   MRN: [id number]  HPI  Felicia Robbins is a 60 year old female with a history of sinusitis who presents today with a chief complaint of cough. She also reports sore throat, headache, ear pain, shortness of breath. Her symptoms have been present for the past 3 days. Her cough is productive with clear sputum. She's taken advil and Nyquill OTC without much improvement. Her cough is worse at night. Denies fevers, sick contacts, sinus pressure.   Review of Systems  Constitutional: Positive for fatigue. Negative for chills and fever.  HENT: Positive for congestion, ear pain and sore throat. Negative for sinus pressure.   Respiratory: Positive for cough and shortness of breath. Negative for wheezing.   Cardiovascular: Negative for chest pain.       Past Medical History:  Diagnosis Date  . Depression   . GERD (gastroesophageal reflux disease)   . Headache(784.0)   . 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 WPW (Wolff-Parkinson-White syndrome)    See scanned history note     Social History   Social History  . Marital status: Legally Separated    Spouse name: N/A  . Number of children: N/A  . Years of education: N/A   Occupational History  . Not on file.   Social History Main Topics  . Smoking status: Former Smoker    Quit date: 04/20/1986  . Smokeless tobacco: Never Used  . Alcohol use 0.0 oz/week     Comment: 2-3 drinks a week  . Drug use: No  . Sexual activity: Not on file   Other Topics Concern  . Not on file   Social History Narrative  . No narrative on file    Past Surgical History:  Procedure Laterality Date  . BIOPSY BOWEL     small bowel biopsy- normal (colon plyps / colonoscopy 03/1999)  . BREAST BIOPSY Left 07/13/2013   Procedure: BREAST BIOPSY WITH NEEDLE LOCALIZATION;  Surgeon: Marcello Moores A. Cornett, MD;  Location: [facility];  Service: General;  Laterality: Left;    . BREAST SURGERY     Lt breast nodule - neg.  . CHOLECYSTECTOMY  2005   lap choli  . INNER EAR SURGERY  2011   Multiple surgeries Lt ear. Hearing device implanted Lt ear 2011.  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 TUBAL LIGATION      Family History  Problem Relation Age of Onset  . Cancer Mother     breast  . Diabetes Mother   . Stroke Mother   . Multiple sclerosis Mother   . Crohn's disease Brother     Allergies  Allergen Reactions  . Tetracycline     REACTION: rash, passed out    Current Outpatient Prescriptions on File Prior to Visit  Medication Sig Dispense Refill  . tiZANidine (ZANAFLEX) 4 MG tablet Take 1 tablet (4 mg total) by mouth at bedtime as needed. 1/2 - 1 tablet by mouth four times a day as needed for headache. 30 tablet 11   No current facility-administered medications on file prior to visit.     BP (!) 144/88   Pulse 86   Temp 98.4 F (36.9 C) (Oral)   Ht 5' 0.75" (1.543 m)   Wt 159 lb 3.2 oz (72.2 kg)   LMP 11/07/2010   SpO2 96%   BMI 30.33 kg/m  Objective:   Physical Exam  Constitutional: She appears well-nourished.  HENT:  Right Ear: Tympanic membrane and ear canal normal.  Left Ear: Tympanic membrane and ear canal normal.  Nose: Right sinus exhibits no maxillary sinus tenderness and no frontal sinus tenderness. Left sinus exhibits no maxillary sinus tenderness and no frontal sinus tenderness.  Mouth/Throat: Oropharynx is clear and moist.  Eyes: Conjunctivae are normal.  Neck: Neck supple.  Cardiovascular: Normal rate and regular rhythm.   Pulmonary/Chest: Effort normal. She has no wheezes. She has rhonchi in the right upper field and the left upper field. She has no rales.  Lymphadenopathy:    She has no cervical adenopathy.  Skin: Skin is warm and dry.          Assessment & Plan:  URI:   Cough, fatigue, sore throat, congestion x 3 days. No improvement with Advil, some with Nyquil last night. Exam today with  mild rhonchi/congestion to upper fields, clears with cough. Bases unremarkable. Does appear ill, but not sickly. Vitals stable. Suspect viral involvement and will treat with supportive measures. Delsym for day cough, Hycodan HS, Mucinex, fluids, rest. Return precautions provided.  Sheral Flow, NP

## 2016-01-07 NOTE — Patient Instructions (Signed)
Your symptoms are related to a viral infection that will improve on its own.  You may take the Hycodan cough suppressant at bedtime as needed for cough and rest. Caution this medication contains codeine and will make you feel drowsy.  Try Delsym cough suppressant for day time cough.   Cough/Congestion: Try taking Mucinex DM. This will help loosen up the mucous in your chest. Ensure you take this medication with a full glass of water.  Please notify me if you develop persistent fevers of 101, start coughing up green mucous, notice increased fatigue or weakness, or feel worse after 1 week of onset of symptoms.   Increase consumption of water intake and rest.  It was a pleasure to see you today!   Upper Respiratory Infection, Adult Most upper respiratory infections (URIs) are a viral infection of the air passages leading to the lungs. A URI affects the nose, throat, and upper air passages. The most common type of URI is nasopharyngitis and is typically referred to as "the common cold." URIs run their course and usually go away on their own. Most of the time, a URI does not require medical attention, but sometimes a bacterial infection in the upper airways can follow a viral infection. This is called a secondary infection. Sinus and middle ear infections are common types of secondary upper respiratory infections. Bacterial pneumonia can also complicate a URI. A URI can worsen asthma and chronic obstructive pulmonary disease (COPD). Sometimes, these complications can require emergency medical care and may be life threatening.  CAUSES Almost all URIs are caused by viruses. A virus is a type of germ and can spread from one person to another.  RISKS FACTORS You may be at risk for a URI if:   You smoke.   You have chronic heart or lung disease.  You have a weakened defense (immune) system.   You are very young or very old.   You have nasal allergies or asthma.  You work in crowded or  poorly ventilated areas.  You work in health care facilities or schools. SIGNS AND SYMPTOMS  Symptoms typically develop 2-3 days after you come in contact with a cold virus. Most viral URIs last 7-10 days. However, viral URIs from the influenza virus (flu virus) can last 14-18 days and are typically more severe. Symptoms may include:   Runny or stuffy (congested) nose.   Sneezing.   Cough.   Sore throat.   Headache.   Fatigue.   Fever.   Loss of appetite.   Pain in your forehead, behind your eyes, and over your cheekbones (sinus pain).  Muscle aches.  DIAGNOSIS  Your health care provider may diagnose a URI by:  Physical exam.  Tests to check that your symptoms are not due to another condition such as:  Strep throat.  Sinusitis.  Pneumonia.  Asthma. TREATMENT  A URI goes away on its own with time. It cannot be cured with medicines, but medicines may be prescribed or recommended to relieve symptoms. Medicines may help:  Reduce your fever.  Reduce your cough.  Relieve nasal congestion. HOME CARE INSTRUCTIONS   Take medicines only as directed by your health care provider.   Gargle warm saltwater or take cough drops to comfort your throat as directed by your health care provider.  Use a warm mist humidifier or inhale steam from a shower to increase air moisture. This may make it easier to breathe.  Drink enough fluid to keep your urine clear or pale  yellow.   Eat soups and other clear broths and maintain good nutrition.   Rest as needed.   Return to work when your temperature has returned to normal or as your health care provider advises. You may need to stay home longer to avoid infecting others. You can also use a face mask and careful hand washing to prevent spread of the virus.  Increase the usage of your inhaler if you have asthma.   Do not use any tobacco products, including cigarettes, chewing tobacco, or electronic cigarettes. If you  need help quitting, ask your health care provider. PREVENTION  The best way to protect yourself from getting a cold is to practice good hygiene.   Avoid oral or hand contact with people with cold symptoms.   Wash your hands often if contact occurs.  There is no clear evidence that vitamin C, vitamin E, echinacea, or exercise reduces the chance of developing a cold. However, it is always recommended to get plenty of rest, exercise, and practice good nutrition.  SEEK MEDICAL CARE IF:   You are getting worse rather than better.   Your symptoms are not controlled by medicine.   You have chills.  You have worsening shortness of breath.  You have brown or red mucus.  You have yellow or brown nasal discharge.  You have pain in your face, especially when you bend forward.  You have a fever.  You have swollen neck glands.  You have pain while swallowing.  You have white areas in the back of your throat. SEEK IMMEDIATE MEDICAL CARE IF:   You have severe or persistent:  Headache.  Ear pain.  Sinus pain.  Chest pain.  You have chronic lung disease and any of the following:  Wheezing.  Prolonged cough.  Coughing up blood.  A change in your usual mucus.  You have a stiff neck.  You have changes in your:  Vision.  Hearing.  Thinking.  Mood. MAKE SURE YOU:   Understand these instructions.  Will watch your condition.  Will get help right away if you are not doing well or get worse.   This information is not intended to replace advice given to you by your health care provider. Make sure you discuss any questions you have with your health care provider.   Document Released: 09/30/2000 Document Revised: 08/21/2014 Document Reviewed: 07/12/2013 Elsevier Interactive Patient Education [employer].

## 2016-02-02 ENCOUNTER — Telehealth: Payer: Self-pay | Admitting: Family Medicine

## 2016-02-02 DIAGNOSIS — Z0000 Encounter for general adult medical examination without abnormal findings: Secondary | ICD-10-CM

## 2016-02-02 NOTE — Telephone Encounter (Signed)
-----   Message from Ellamae Sia sent at 01/30/2016 11:54 AM EDT ----- Regarding: Lab orders for Thursday, 10.19.17 Patient is scheduled for CPX labs, please order future labs, Thanks , Karna [facility]

## 2016-02-06 ENCOUNTER — Other Ambulatory Visit (INDEPENDENT_AMBULATORY_CARE_PROVIDER_SITE_OTHER): Payer: Managed Care, Other (non HMO)

## 2016-02-06 DIAGNOSIS — Z0000 Encounter for general adult medical examination without abnormal findings: Secondary | ICD-10-CM

## 2016-02-06 LAB — CBC WITH DIFFERENTIAL/PLATELET
BASOS PCT: 0.3 % (ref 0.0–3.0)
Basophils Absolute: 0 10*3/uL (ref 0.0–0.1)
EOS PCT: 3.4 % (ref 0.0–5.0)
Eosinophils Absolute: 0.3 10*3/uL (ref 0.0–0.7)
HEMATOCRIT: 37.1 % (ref 36.0–46.0)
HEMOGLOBIN: 12.5 g/dL (ref 12.0–15.0)
Lymphocytes Relative: 37.7 % (ref 12.0–46.0)
Lymphs Abs: 2.8 10*3/uL (ref 0.7–4.0)
MCHC: 33.7 g/dL (ref 30.0–36.0)
MCV: 91.6 fl (ref 78.0–100.0)
MONO ABS: 0.7 10*3/uL (ref 0.1–1.0)
Monocytes Relative: 9.4 % (ref 3.0–12.0)
Neutro Abs: 3.6 10*3/uL (ref 1.4–7.7)
Neutrophils Relative %: 49.2 % (ref 43.0–77.0)
Platelets: 244 10*3/uL (ref 150.0–400.0)
RBC: 4.05 Mil/uL (ref 3.87–5.11)
RDW: 14.4 % (ref 11.5–15.5)
WBC: 7.4 10*3/uL (ref 4.0–10.5)

## 2016-02-06 LAB — TSH: TSH: 0.59 u[IU]/mL (ref 0.35–4.50)

## 2016-02-06 LAB — COMPREHENSIVE METABOLIC PANEL
ALBUMIN: 4 g/dL (ref 3.5–5.2)
ALT: 17 U/L (ref 0–35)
AST: 16 U/L (ref 0–37)
Alkaline Phosphatase: 67 U/L (ref 39–117)
BUN: 20 mg/dL (ref 6–23)
CALCIUM: 9 mg/dL (ref 8.4–10.5)
CHLORIDE: 107 meq/L (ref 96–112)
CO2: 27 mEq/L (ref 19–32)
Creatinine, Ser: 0.63 mg/dL (ref 0.40–1.20)
GFR: 102.69 mL/min (ref >60.00)
Glucose, Bld: 99 mg/dL (ref 70–99)
POTASSIUM: 4.2 meq/L (ref 3.5–5.1)
SODIUM: 141 meq/L (ref 135–145)
Total Bilirubin: 0.3 mg/dL (ref 0.2–1.2)
Total Protein: 7 g/dL (ref 6.0–8.3)

## 2016-02-06 LAB — LIPID PANEL
CHOLESTEROL: 187 mg/dL (ref 0–200)
HDL: 65.5 mg/dL (ref >39.00)
LDL Cholesterol: 105 mg/dL — ABNORMAL HIGH (ref 0–99)
NonHDL: 121.44
Total CHOL/HDL Ratio: 3
Triglycerides: 81 mg/dL (ref 0.0–149.0)
VLDL: 16.2 mg/dL (ref 0.0–40.0)

## 2016-02-11 ENCOUNTER — Encounter: Payer: Self-pay | Admitting: Family Medicine

## 2016-02-11 ENCOUNTER — Ambulatory Visit (INDEPENDENT_AMBULATORY_CARE_PROVIDER_SITE_OTHER): Payer: Managed Care, Other (non HMO) | Admitting: Family Medicine

## 2016-02-11 VITALS — BP 112/68 | HR 83 | Temp 98.3°F | Ht 61.0 in | Wt 164.2 lb

## 2016-02-11 DIAGNOSIS — I10 Essential (primary) hypertension: Secondary | ICD-10-CM | POA: Diagnosis not present

## 2016-02-11 DIAGNOSIS — E669 Obesity, unspecified: Secondary | ICD-10-CM | POA: Insufficient documentation

## 2016-02-11 DIAGNOSIS — E739 Lactose intolerance, unspecified: Secondary | ICD-10-CM | POA: Insufficient documentation

## 2016-02-11 DIAGNOSIS — Z0000 Encounter for general adult medical examination without abnormal findings: Secondary | ICD-10-CM

## 2016-02-11 DIAGNOSIS — E6609 Other obesity due to excess calories: Secondary | ICD-10-CM | POA: Diagnosis not present

## 2016-02-11 DIAGNOSIS — Z6831 Body mass index (BMI) 31.0-31.9, adult: Secondary | ICD-10-CM

## 2016-02-11 MED ORDER — TIZANIDINE HCL 4 MG PO TABS: 4.0000 mg | ORAL_TABLET | Freq: Every evening | ORAL | 11 refills | Status: DC | PRN

## 2016-02-11 NOTE — Assessment & Plan Note (Signed)
Discussed how this problem influences overall health and the risks it imposes  Reviewed plan for weight loss with lower calorie diet (via better food choices and also portion control or program like weight watchers) and exercise building up to or more than 30 minutes 5 days per week including some aerobic activity   Disc plan to avoid processed foods and refined carbs Start exercise once knee is improved

## 2016-02-11 NOTE — Patient Instructions (Addendum)
On the way out -make an appt with Dr Lorelei Pont for left knee pain  Avoid dairy (or take lactaid if you eat dairy products) - and let me know if the loose stool and the left sided abdominal pain/tenderness does not go away  Take care of yourself  Start working on diet - try eating more fruit/veg/ lean protein and less refined carbohydrates (bread/pasta / white flour items/ junk and convenience foods) , and processed foods - do more prep from home and eat out less Once your knee feels better-work towards a goal of exercise 5 days per week 30 or more minutes

## 2016-02-11 NOTE — Progress Notes (Signed)
Subjective:    Patient ID: Felicia Robbins, female    DOB: 1955-04-21, 60 y.o.   MRN: [id number]  HPI Here for health maintenance exam and to review chronic medical problems    Doing ok overall  Trying to take care of herself   Has been battling OE for a few weeks  Sees ENT- and had it irrigated also  Finally getting better   Wt Readings from Last 3 Encounters:  02/11/16 164 lb 4 oz (74.5 kg)  01/07/16 159 lb 3.2 oz (72.2 kg)  04/04/15 152 lb 6.4 oz (69.1 kg)  eating -not so good - bad choices and too much? But does not eat fast food/ also eats fruits and vegetables (with smoothies as well)  Some pizza and hamburgers  No exercise - wants to get started  bmi is 31.0  Having knee problems - L knee / super sore/ mostly medial  That limits exercise and has been going on 3 mo  Thinks she twisted it playing with grandson  It catches occ    Flu shot - had it in sept at work   Mammogram 12/16 normal , she gets them at [facility]  Self breast exam - no lumps   Pap 12/16 -neg with gyn   Colonoscopy 9/09-nl with 10 year recall    Tetanus shot 2/14  bp is stable today - very good  No cp or palpitations or headaches or edema  No side effects to medicines  BP Readings from Last 3 Encounters:  02/11/16 112/68  01/07/16 (!) 144/88  04/04/15 (!) 142/88      Lab on 02/06/2016  Component Date Value Ref Range Status  . WBC 02/06/2016 7.4  4.0 - 10.5 K/uL Final  . RBC 02/06/2016 4.05  3.87 - 5.11 Mil/uL Final  . Hemoglobin 02/06/2016 12.5  12.0 - 15.0 g/dL Final  . HCT 02/06/2016 37.1  36.0 - 46.0 % Final  . MCV 02/06/2016 91.6  78.0 - 100.0 fl Final  . MCHC 02/06/2016 33.7  30.0 - 36.0 g/dL Final  . RDW 02/06/2016 14.4  11.5 - 15.5 % Final  . Platelets 02/06/2016 244.0  150.0 - 400.0 K/uL Final  . Neutrophils Relative % 02/06/2016 49.2  43.0 - 77.0 % Final  . Lymphocytes Relative 02/06/2016 37.7  12.0 - 46.0 % Final  . Monocytes Relative 02/06/2016 9.4  3.0 -  12.0 % Final  . Eosinophils Relative 02/06/2016 3.4  0.0 - 5.0 % Final  . Basophils Relative 02/06/2016 0.3  0.0 - 3.0 % Final  . Neutro Abs 02/06/2016 3.6  1.4 - 7.7 K/uL Final  . Lymphs Abs 02/06/2016 2.8  0.7 - 4.0 K/uL Final  . Monocytes Absolute 02/06/2016 0.7  0.1 - 1.0 K/uL Final  . Eosinophils Absolute 02/06/2016 0.3  0.0 - 0.7 K/uL Final  . Basophils Absolute 02/06/2016 0.0  0.0 - 0.1 K/uL Final  . Sodium 02/06/2016 141  135 - 145 mEq/L Final  . Potassium 02/06/2016 4.2  3.5 - 5.1 mEq/L Final  . Chloride 02/06/2016 107  96 - 112 mEq/L Final  . CO2 02/06/2016 27  19 - 32 mEq/L Final  . Glucose, Bld 02/06/2016 99  70 - 99 mg/dL Final  . BUN 02/06/2016 20  6 - 23 mg/dL Final  . Creatinine, Ser 02/06/2016 0.63  0.40 - 1.20 mg/dL Final  . Total Bilirubin 02/06/2016 0.3  0.2 - 1.2 mg/dL Final  . Alkaline Phosphatase 02/06/2016 67  39 - 117  U/L Final  . AST 02/06/2016 16  0 - 37 U/L Final  . ALT 02/06/2016 17  0 - 35 U/L Final  . Total Protein 02/06/2016 7.0  6.0 - 8.3 g/dL Final  . Albumin 02/06/2016 4.0  3.5 - 5.2 g/dL Final  . Calcium 02/06/2016 9.0  8.4 - 10.5 mg/dL Final  . GFR 02/06/2016 102.69  >60.00 mL/min Final  . Cholesterol 02/06/2016 187  0 - 200 mg/dL Final  . Triglycerides 02/06/2016 81.0  0.0 - 149.0 mg/dL Final  . HDL 02/06/2016 65.50  >39.00 mg/dL Final  . VLDL 02/06/2016 16.2  0.0 - 40.0 mg/dL Final  . LDL Cholesterol 02/06/2016 105* 0 - 99 mg/dL Final  . Total CHOL/HDL Ratio 02/06/2016 3   Final  . NonHDL 02/06/2016 121.44   Final  . TSH 02/06/2016 0.59  0.35 - 4.50 uIU/mL Final     LDL went down from 130 to 105   Review of Systems Review of Systems  Constitutional: Negative for fever, appetite change, fatigue and unexpected weight change.  Eyes: Negative for pain and visual disturbance.  Respiratory: Negative for cough and shortness of breath.   Cardiovascular: Negative for cp or palpitations    Gastrointestinal: Negative for nausea, diarrhea and  constipation. pos for loose stools and occ left abd pain (when she eats dairy) Genitourinary: Negative for urgency and frequency.  Skin: Negative for pallor or rash   MSK pos for L knee pain  Neurological: Negative for weakness, light-headedness, numbness and headaches.  Hematological: Negative for adenopathy. Does not bruise/bleed easily.  Psychiatric/Behavioral: Negative for dysphoric mood. The patient is not nervous/anxious.  pos for stressors        Objective:   Physical Exam  Constitutional: She appears well-developed and well-nourished. No distress.  obese and well appearing   HENT:  Head: Normocephalic and atraumatic.  Right Ear: External ear normal.  Nose: Nose normal.  Mouth/Throat: Oropharynx is clear and moist.  Baseline L ear deformity- no d/c or erythema of canal today  Eyes: Conjunctivae and EOM are normal. Pupils are equal, round, and reactive to light. Right eye exhibits no discharge. Left eye exhibits no discharge. No scleral icterus.  Neck: Normal range of motion. Neck supple. No JVD present. Carotid bruit is not present. No thyromegaly present.  Cardiovascular: Normal rate, regular rhythm, normal heart sounds and intact distal pulses.  Exam reveals no gallop.   Pulmonary/Chest: Effort normal and breath sounds normal. No respiratory distress. She has no wheezes. She has no rales.  Abdominal: Soft. Bowel sounds are normal. She exhibits no distension and no mass. There is tenderness. There is no rebound and no guarding.  Mild tenderness w/o rebound or guarding in L mid abdomen No mass    Genitourinary:  Genitourinary Comments: Pt sees gyn provider separately  Musculoskeletal: She exhibits tenderness. She exhibits no edema.  Tender over medial L knee with nl rom   Lymphadenopathy:    She has no cervical adenopathy.  Neurological: She is alert. She has normal reflexes. No cranial nerve deficit. She exhibits normal muscle tone. Coordination normal.  Skin: Skin is warm  and dry. No rash noted. No erythema. No pallor.  Psychiatric: She has a normal mood and affect.          Assessment & Plan:   Problem List Items Addressed This Visit      Cardiovascular and Mediastinum   Essential hypertension    Controlled entirely w/o medicine at this time bp in fair control at  this time  BP Readings from Last 1 Encounters:  02/11/16 112/68   No changes needed Disc lifstyle change with low sodium diet and exercise   Made a plan for an exercise program when her knee improves Also rec DASH eating plan  And enc wt loss         Digestive   Lactose intolerance    This causes gas pain and loose stool  She had some abd tenderness on exam today - she thinks due to eating butter this am  inst to stop dairy products  Update if this pain/tenderness does not resolve        Other   Obesity    Discussed how this problem influences overall health and the risks it imposes  Reviewed plan for weight loss with lower calorie diet (via better food choices and also portion control or program like weight watchers) and exercise building up to or more than 30 minutes 5 days per week including some aerobic activity   Disc plan to avoid processed foods and refined carbs Start exercise once knee is improved       Routine general medical examination at a health care facility    Reviewed health habits including diet and exercise and skin cancer prevention Reviewed appropriate screening tests for age  Also reviewed health mt list, fam hx and immunization status , as well as social and family history   See HPI bp is controlled w/o medication  Disc avoiding lactose containing foods if they cause GI distress Rev plan for wt loss and fitness  She will see sport med for current knee problem utd imms and screening  utd gyn care with her gynecologist        Other Visit Diagnoses   None.

## 2016-02-11 NOTE — Assessment & Plan Note (Addendum)
Reviewed health habits including diet and exercise and skin cancer prevention Reviewed appropriate screening tests for age  Also reviewed health mt list, fam hx and immunization status , as well as social and family history   See HPI bp is controlled w/o medication  Disc avoiding lactose containing foods if they cause GI distress Rev plan for wt loss and fitness  She will see sport med for current knee problem utd imms and screening  utd gyn care with her gynecologist

## 2016-02-11 NOTE — Assessment & Plan Note (Signed)
Controlled entirely w/o medicine at this time bp in fair control at this time  BP Readings from Last 1 Encounters:  02/11/16 112/68   No changes needed Disc lifstyle change with low sodium diet and exercise   Made a plan for an exercise program when her knee improves Also rec DASH eating plan  And enc wt loss

## 2016-02-11 NOTE — Assessment & Plan Note (Signed)
This causes gas pain and loose stool  She had some abd tenderness on exam today - she thinks due to eating butter this am  inst to stop dairy products  Update if this pain/tenderness does not resolve

## 2016-02-11 NOTE — Progress Notes (Signed)
Pre visit review using our clinic review tool, if applicable. No additional management support is needed unless otherwise documented below in the visit note. 

## 2016-02-13 ENCOUNTER — Encounter: Payer: Self-pay | Admitting: Family Medicine

## 2016-02-13 ENCOUNTER — Ambulatory Visit (INDEPENDENT_AMBULATORY_CARE_PROVIDER_SITE_OTHER)
Admission: RE | Admit: 2016-02-13 | Discharge: 2016-02-13 | Disposition: A | Discharge status: Home / Self Care | Payer: Managed Care, Other (non HMO) | Source: Ambulatory Visit | Attending: Family Medicine | Admitting: Family Medicine

## 2016-02-13 ENCOUNTER — Ambulatory Visit (INDEPENDENT_AMBULATORY_CARE_PROVIDER_SITE_OTHER): Payer: Managed Care, Other (non HMO) | Admitting: Family Medicine

## 2016-02-13 VITALS — BP 120/62 | HR 96 | Temp 98.6°F | Ht 61.0 in | Wt 164.2 lb

## 2016-02-13 DIAGNOSIS — M25562 Pain in left knee: Secondary | ICD-10-CM

## 2016-02-13 DIAGNOSIS — M2392 Unspecified internal derangement of left knee: Secondary | ICD-10-CM

## 2016-02-13 MED ORDER — DICLOFENAC SODIUM 75 MG PO TBEC: 75.0000 mg | DELAYED_RELEASE_TABLET | Freq: Two times a day (BID) | ORAL | 0 refills | Status: DC

## 2016-02-13 MED ORDER — METHYLPREDNISOLONE ACETATE 40 MG/ML IJ SUSP
80.0000 mg | Freq: Once | INTRAMUSCULAR | Status: AC
  Administered 2016-02-13: 80 mg via INTRA_ARTICULAR

## 2016-02-13 NOTE — Progress Notes (Signed)
Dr. Frederico Hamman T. Terrika Zuver, MD, [facility] Primary [facility] and Sports Medicine [place], [address] Phone: [phone] Fax: [phone]  02/13/2016  Patient: Felicia Robbins, MRN: [id number], DOB: 1955-08-11, 60 y.o.  Primary Physician:  Loura Pardon, MD   Chief Complaint  Patient presents with  . Knee Pain    Left-Twisted knee 2 months ago   Subjective:   Felicia Robbins is a 60 y.o. very pleasant female patient who presents with the following:  Patient presents with 2 mo h/o L sided knee pain after twisting her knee. No audible pop was heard. The patient has had an effusion. No symptomatic giving-way. No mechanical clicking. Joint has not locked up. Patient has been able to walk but is limping. The patient does have pain going up and down stairs or rising from a seated position.   Pain location: medial Current physical activity: none Prior Knee Surgery: none Current pain meds: nsaids and tylenol Bracing: OTC brace  A couple of months ago, twisted knee and then got swollen.  Hurt it 40 years ago.  Took some OTC meds.  Still having some persistent pain.   Past Medical History, Surgical History, Social History, Family History, Problem List, Medications, and Allergies have been reviewed and updated if relevant.  Patient Active Problem List   Diagnosis Date Noted  . Obesity 02/11/2016  . Lactose intolerance 02/11/2016  . Pain in joint, shoulder region 09/07/2014  . Stress reaction, emotional 01/19/2012  . Gynecologic exam normal 12/08/2010  . Routine general medical examination at a health care facility 11/29/2010  . Hot flashes 08/11/2010  . RHINITIS 08/15/2007  . FIBROCYSTIC BREAST DISEASE 09/24/2006  . HX, PERSONAL, COLONIC POLYPS 09/24/2006  . MIGRAINE, COMMON 08/25/2006  . DECREASED HEARING 08/25/2006  . Essential hypertension 08/25/2006  . Esophageal reflux 08/25/2006  . MIGRAINES, HX OF 08/25/2006    Past Medical History:    Diagnosis Date  . Depression   . GERD (gastroesophageal reflux disease)   . Headache(784.0)   . 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 WPW (Wolff-Parkinson-White syndrome)    See scanned history note    Past Surgical History:  Procedure Laterality Date  . BIOPSY BOWEL     small bowel biopsy- normal (colon plyps / colonoscopy 03/1999)  . BREAST BIOPSY Left 07/13/2013   Procedure: BREAST BIOPSY WITH NEEDLE LOCALIZATION;  Surgeon: Marcello Moores A. Cornett, MD;  Location: [facility];  Service: General;  Laterality: Left;  . BREAST SURGERY     Lt breast nodule - neg.  . CHOLECYSTECTOMY  2005   lap choli  . INNER EAR SURGERY  2011   Multiple surgeries Lt ear. Hearing device implanted Lt ear 2011.  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 TUBAL LIGATION      Social History   Social History  . Marital status: Legally Separated    Spouse name: N/A  . Number of children: N/A  . Years of education: N/A   Occupational History  . Not on file.   Social History Main Topics  . Smoking status: Former Smoker    Quit date: 04/20/1986  . Smokeless tobacco: Never Used  . Alcohol use 0.0 oz/week     Comment: 2-3 drinks a week  . Drug use: No  . Sexual activity: Not on file   Other Topics Concern  . Not on file   Social History Narrative  . No narrative  on file    Family History  Problem Relation Age of Onset  . Cancer Mother     breast  . Diabetes Mother   . Stroke Mother   . Multiple sclerosis Mother   . Crohn's disease Brother     Allergies  Allergen Reactions  . Tetracycline     REACTION: rash, passed out    Medication list reviewed and updated in full in [facility].  GEN: No fevers, chills. Nontoxic. Primarily MSK c/o today. MSK: Detailed in the HPI GI: tolerating PO intake without difficulty Neuro: No numbness, parasthesias, or tingling associated. Otherwise the pertinent positives of the ROS are noted above.   Objective:   BP  120/62   Pulse 96   Temp 98.6 F (37 C) (Oral)   Ht 5\' 1"  (1.549 m)   Wt 164 lb 4 oz (74.5 kg)   LMP 11/07/2010   BMI 31.03 kg/m    GEN: WDWN, NAD, Non-toxic, Alert & Oriented x 3 HEENT: Atraumatic, Normocephalic.  Ears and Nose: No external deformity. EXTR: No clubbing/cyanosis/edema NEURO: Normal gait.  PSYCH: Normally interactive. Conversant. Not depressed or anxious appearing.  Calm demeanor.   Knee:  L Gait: Normal heel toe pattern with some antalgia ROM: 0-110 Effusion: neg Echymosis or edema: none Patellar tendon NT Painful PLICA: neg Patellar grind: negative Medial and lateral patellar facet loading: medial tender medial and lateral joint lines: medial joint line ttp Mcmurray's pos Flexion-pinch pos Varus and valgus stress: stable Lachman: neg Ant and Post drawer: neg Hip abduction, IR, ER: WNL Hip flexion str: 5/5 Hip abd: 5/5 Quad: 5/5 VMO atrophy:No Hamstring concentric and eccentric: 5/5   Radiology: Dg Knee Ap/lat W/sunrise Left  Result Date: 02/13/2016 CLINICAL DATA:  Left knee pain for 2 months. EXAM: LEFT KNEE 3 VIEWS COMPARISON:  Knee MRI, 01/16/2012. Left knee radiographs, 11/03/2011. FINDINGS: No evidence of fracture, dislocation, or joint effusion. No evidence of arthropathy or other focal bone abnormality. Soft tissues are unremarkable. IMPRESSION: Negative. Electronically Signed   By: Lajean Manes M.D.   On: 02/13/2016 13:33     Assessment and Plan:   Acute internal derangement of left knee  Acute pain of left knee - Plan: DG Knee AP/LAT W/Sunrise Left, methylPREDNISolone acetate (DEPO-MEDROL) injection 80 mg  Examination is suspicious for medial meniscal tear. Well-preserved joint spaces on radiographs.  Scheduled diclofenac, ice, and place the patient in a patellar J brace.  Knee Injection, L Patient verbally consented to procedure. Risks (including potential rare risk of infection), benefits, and alternatives explained. Sterilely  prepped with Chloraprep. Ethyl cholride used for anesthesia. 8 cc Lidocaine 1% mixed with 2 mL Depo-Medrol 40 mg injected using the anteromedial approach without difficulty. No complications with procedure and tolerated well. Patient had decreased pain post-injection.   Follow-up: 4 weeks  New Prescriptions   DICLOFENAC (VOLTAREN) 75 MG EC TABLET    Take 1 tablet (75 mg total) by mouth 2 (two) times daily.   Orders Placed This Encounter  Procedures  . DG Knee AP/LAT W/Sunrise Left    Signed,  Yvan Dority T. Dearl Rudden, MD   Patient's Medications  New Prescriptions   DICLOFENAC (VOLTAREN) 75 MG EC TABLET    Take 1 tablet (75 mg total) by mouth 2 (two) times daily.  Previous Medications   OTOVEL 0.3-0.025 % SOLN    Place 1 drop into the left ear 2 (two) times daily.   TIZANIDINE (ZANAFLEX) 4 MG TABLET    Take 1 tablet (4 mg  total) by mouth at bedtime as needed. 1/2 - 1 tablet by mouth four times a day as needed for headache.  Modified Medications   No medications on file  Discontinued Medications   No medications on file

## 2016-02-13 NOTE — Progress Notes (Signed)
Pre visit review using our clinic review tool, if applicable. No additional management support is needed unless otherwise documented below in the visit note. 

## 2016-03-17 ENCOUNTER — Encounter: Payer: Self-pay | Admitting: Family Medicine

## 2016-03-17 ENCOUNTER — Ambulatory Visit (INDEPENDENT_AMBULATORY_CARE_PROVIDER_SITE_OTHER)
Admission: RE | Admit: 2016-03-17 | Discharge: 2016-03-17 | Disposition: A | Discharge status: Home / Self Care | Payer: Managed Care, Other (non HMO) | Source: Ambulatory Visit | Attending: Family Medicine | Admitting: Family Medicine

## 2016-03-17 ENCOUNTER — Ambulatory Visit (INDEPENDENT_AMBULATORY_CARE_PROVIDER_SITE_OTHER): Payer: Managed Care, Other (non HMO) | Admitting: Family Medicine

## 2016-03-17 VITALS — BP 126/82 | HR 77 | Temp 98.8°F | Ht 61.0 in | Wt 164.2 lb

## 2016-03-17 DIAGNOSIS — R0789 Other chest pain: Secondary | ICD-10-CM

## 2016-03-17 DIAGNOSIS — R1012 Left upper quadrant pain: Secondary | ICD-10-CM | POA: Insufficient documentation

## 2016-03-17 DIAGNOSIS — R05 Cough: Secondary | ICD-10-CM

## 2016-03-17 DIAGNOSIS — R058 Other specified cough: Secondary | ICD-10-CM

## 2016-03-17 DIAGNOSIS — M25512 Pain in left shoulder: Secondary | ICD-10-CM

## 2016-03-17 DIAGNOSIS — R202 Paresthesia of skin: Secondary | ICD-10-CM | POA: Diagnosis not present

## 2016-03-17 MED ORDER — PREDNISONE 10 MG PO TABS: ORAL_TABLET | ORAL | 0 refills | Status: DC

## 2016-03-17 NOTE — Progress Notes (Signed)
Subjective:    Patient ID: Felicia Robbins, female    DOB: 1955-12-16, 60 y.o.   MRN: [id number]  HPI Here for hand /arm symptoms on the L   Her hand felt tingly and numb all day Then she developed pain radiating up her forearm up to elbow - hurts to raise it   No swelling in fingers/hand/wrist /elbow No neck pain  No chest pain or pressure  Has had a cough for a while - (uri symptoms the last week)- prod of brown/green d/c  Not a smoker  Nose is runny and occ nosebleeds   Still has some pain in her L upper abd on and off  Took diclofenac for a week for her knee - then forgot to take any more   No new exercise or activity or reped tasks   Grip is fine  Does not hurt to grip     She had similar symptoms in the past - it was related to her heart (? Inflammation of lining of chest/lung/heart? ) - 20 years ago, she is not sure  Patient Active Problem List   Diagnosis Date Noted  . Paresthesia of hand 03/17/2016  . Left shoulder pain 03/17/2016  . Chest wall pain 03/17/2016  . Productive cough 03/17/2016  . Abdominal pain, left upper quadrant 03/17/2016  . Obesity 02/11/2016  . Lactose intolerance 02/11/2016  . Pain in joint, shoulder region 09/07/2014  . Stress reaction, emotional 01/19/2012  . Gynecologic exam normal 12/08/2010  . Routine general medical examination at a health care facility 11/29/2010  . Hot flashes 08/11/2010  . RHINITIS 08/15/2007  . FIBROCYSTIC BREAST DISEASE 09/24/2006  . HX, PERSONAL, COLONIC POLYPS 09/24/2006  . MIGRAINE, COMMON 08/25/2006  . DECREASED HEARING 08/25/2006  . Essential hypertension 08/25/2006  . Esophageal reflux 08/25/2006  . MIGRAINES, HX OF 08/25/2006   Past Medical History:  Diagnosis Date  . Depression   . GERD (gastroesophageal reflux disease)   . Headache(784.0)   . 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 WPW (Wolff-Parkinson-White syndrome)    See scanned history note   Past Surgical History:  Procedure  Laterality Date  . BIOPSY BOWEL     small bowel biopsy- normal (colon plyps / colonoscopy 03/1999)  . BREAST BIOPSY Left 07/13/2013   Procedure: BREAST BIOPSY WITH NEEDLE LOCALIZATION;  Surgeon: Marcello Moores A. Cornett, MD;  Location: [facility];  Service: General;  Laterality: Left;  . BREAST SURGERY     Lt breast nodule - neg.  . CHOLECYSTECTOMY  2005   lap choli  . INNER EAR SURGERY  2011   Multiple surgeries Lt ear. Hearing device implanted Lt ear 2011.  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 TUBAL LIGATION     Social History  Substance Use Topics  . Smoking status: Former Smoker    Quit date: 04/20/1986  . Smokeless tobacco: Never Used  . Alcohol use 0.0 oz/week     Comment: 2-3 drinks a week   Family History  Problem Relation Age of Onset  . Cancer Mother     breast  . Diabetes Mother   . Stroke Mother   . Multiple sclerosis Mother   . Crohn's disease Brother    Allergies  Allergen Reactions  . Tetracycline     REACTION: rash, passed out   Current Outpatient Prescriptions on File Prior to Visit  Medication Sig Dispense Refill  . diclofenac (VOLTAREN) 75 MG  EC tablet Take 1 tablet (75 mg total) by mouth 2 (two) times daily. 60 tablet 0  . OTOVEL 0.3-0.025 % SOLN Place 1 drop into the left ear 2 (two) times daily.    Marland Kitchen tiZANidine (ZANAFLEX) 4 MG tablet Take 1 tablet (4 mg total) by mouth at bedtime as needed. 1/2 - 1 tablet by mouth four times a day as needed for headache. 30 tablet 11   No current facility-administered medications on file prior to visit.     Review of Systems    Review of Systems  Constitutional: Negative for fever, appetite change, fatigue and unexpected weight change.  Eyes: Negative for pain and visual disturbance.  Respiratory: Negative for wheeze and shortness of breath.  pos for prod cough  Cardiovascular: Negative for cp or palpitations    Gastrointestinal: Negative for nausea, diarrhea and constipation. Pos for abdominal  pain   Genitourinary: Negative for urgency and frequency.  Skin: Negative for pallor or rash   MSK pos for L arm pain and wrist /hand pain and tingling pos for ongoing knee pain  Neurological: Negative for weakness, light-headedness, numbness and headaches.  Hematological: Negative for adenopathy. Does not bruise/bleed easily.  Psychiatric/Behavioral: Negative for dysphoric mood. The patient is not nervous/anxious.      Objective:   Physical Exam  Constitutional: She appears well-developed and well-nourished. No distress.  overwt and well appearing   HENT:  Head: Normocephalic and atraumatic.  Mouth/Throat: Oropharynx is clear and moist.  Eyes: Conjunctivae and EOM are normal. Pupils are equal, round, and reactive to light.  Neck: Normal range of motion. Neck supple. No JVD present. Carotid bruit is not present. No thyromegaly present.  Cardiovascular: Normal rate, regular rhythm, normal heart sounds and intact distal pulses.  Exam reveals no gallop.   Pulmonary/Chest: Effort normal and breath sounds normal. No respiratory distress. She has no wheezes. She has no rales.  No crackles  Abdominal: Soft. Bowel sounds are normal. She exhibits no distension, no abdominal bruit and no mass. There is tenderness in the left upper quadrant. There is no rebound and no guarding.    Musculoskeletal: She exhibits tenderness. She exhibits no edema.       Left shoulder: She exhibits decreased range of motion, tenderness and bony tenderness. She exhibits no swelling, no effusion, no crepitus, no deformity and normal strength.  Pos Hawking and Neer tests of L shoulder  Tender in deltoid area  Limited ext/int rot of shoulder due to pain  No elbow tenderness  No wrist tenderness   Neuro exam of L hand nl despite c/o tingling  Neg tinel and phalen exams  Lymphadenopathy:    She has no cervical adenopathy.  Neurological: She is alert. She has normal reflexes. She displays no atrophy. No sensory  deficit. She exhibits normal muscle tone.  Nl neuro exam of hands  Neg tinel and phalen tests Nl grip /strength and dexterity   Skin: Skin is warm and dry. No rash noted. No pallor.  Psychiatric: She has a normal mood and affect.          Assessment & Plan:   Problem List Items Addressed This Visit      Other   Productive cough    S/p uri  Re assuring exam  cxr today in light of cwp and tenderness      Relevant Orders   DG Chest 2 View (Completed)   Paresthesia of hand    I suspect this is related to her shoulder  and deltoid area pain (also positional)- but exam is non focal neurologically  pred taper given  Neg carpal tunnel signs Update if no imp  cxr today in light of cwp as well      Left shoulder pain    Suspect tendonitis -has some rotator cuff symptoms  Suspect this is the cause of upper arm pain but unsure if rel to her hand tingling  pred taper given  Ice/ relative rest  rom exercises/passive She has an upcoming appt with Dr Lorelei Pont      Chest wall pain    L sided with tenderness Also recent cough cxr today       Relevant Orders   DG Chest 2 View (Completed)   Abdominal pain, left upper quadrant    Perhaps recent nsaid caused some gastritis  Will try zantac 150 bid otc and report back  Update if symptoms worsen however

## 2016-03-17 NOTE — Progress Notes (Signed)
Pre visit review using our clinic review tool, if applicable. No additional management support is needed unless otherwise documented below in the visit note. 

## 2016-03-17 NOTE — Patient Instructions (Signed)
For your stomach get zantac 150 mg over the counter and take 1 pill twice daily  Take the prednisone as directed  Use warm compress on chest or arm when it hurts  Chest xray today - we will call you with a result  See Dr copland as planned

## 2016-03-18 ENCOUNTER — Ambulatory Visit (INDEPENDENT_AMBULATORY_CARE_PROVIDER_SITE_OTHER): Payer: Managed Care, Other (non HMO) | Admitting: Family Medicine

## 2016-03-18 ENCOUNTER — Encounter: Payer: Self-pay | Admitting: Family Medicine

## 2016-03-18 VITALS — BP 124/74 | HR 84 | Temp 97.9°F | Ht 64.0 in | Wt 162.5 lb

## 2016-03-18 DIAGNOSIS — M7542 Impingement syndrome of left shoulder: Secondary | ICD-10-CM

## 2016-03-18 DIAGNOSIS — M25562 Pain in left knee: Secondary | ICD-10-CM | POA: Diagnosis not present

## 2016-03-18 DIAGNOSIS — M5412 Radiculopathy, cervical region: Secondary | ICD-10-CM | POA: Diagnosis not present

## 2016-03-18 NOTE — Assessment & Plan Note (Signed)
S/p uri  Re assuring exam  cxr today in light of cwp and tenderness

## 2016-03-18 NOTE — Assessment & Plan Note (Signed)
Suspect tendonitis -has some rotator cuff symptoms  Suspect this is the cause of upper arm pain but unsure if rel to her hand tingling  pred taper given  Ice/ relative rest  rom exercises/passive She has an upcoming appt with Dr Lorelei Pont

## 2016-03-18 NOTE — Progress Notes (Signed)
Pre visit review using our clinic review tool, if applicable. No additional management support is needed unless otherwise documented below in the visit note. 

## 2016-03-18 NOTE — Assessment & Plan Note (Signed)
Perhaps recent nsaid caused some gastritis  Will try zantac 150 bid otc and report back  Update if symptoms worsen however

## 2016-03-18 NOTE — Progress Notes (Signed)
Dr. Frederico Hamman T. Lizzett Nobile, MD, [facility] Primary Care and Sports Medicine [place], [address] Phone: [phone] Fax: [phone]  03/18/2016  Patient: Felicia Robbins, MRN: [id number], DOB: 01-Jun-1955, 60 y.o.  Primary Physician:  Loura Pardon, MD   Chief Complaint  Patient presents with  . Follow-up    Left knee  . Shoulder Pain    Left   Subjective:   Felicia Robbins is a 60 y.o. very pleasant female patient who presents with the following:  Left knee is doing really well. - now not tucking knee under. No exercise. This is for follow-up on internal derangement, probable meniscal pathology. She is doing well currently.  Left shoulder, new problem. At work, fingers tingling and then got numb. Pain all the way down her left arm. The next morning - numb in hand and fingers. Currently taking oral prednisone. Feeling better today compared to yesterday. Length of symptoms 2 days.  Past Medical History, Surgical History, Social History, Family History, Problem List, Medications, and Allergies have been reviewed and updated if relevant.  Patient Active Problem List   Diagnosis Date Noted  . Paresthesia of hand 03/17/2016  . Left shoulder pain 03/17/2016  . Chest wall pain 03/17/2016  . Productive cough 03/17/2016  . Abdominal pain, left upper quadrant 03/17/2016  . Obesity 02/11/2016  . Lactose intolerance 02/11/2016  . Pain in joint, shoulder region 09/07/2014  . Stress reaction, emotional 01/19/2012  . Gynecologic exam normal 12/08/2010  . Routine general medical examination at a health care facility 11/29/2010  . Hot flashes 08/11/2010  . RHINITIS 08/15/2007  . FIBROCYSTIC BREAST DISEASE 09/24/2006  . HX, PERSONAL, COLONIC POLYPS 09/24/2006  . MIGRAINE, COMMON 08/25/2006  . DECREASED HEARING 08/25/2006  . Essential hypertension 08/25/2006  . Esophageal reflux 08/25/2006  . MIGRAINES, HX OF 08/25/2006    Past Medical History:  Diagnosis  Date  . Depression   . GERD (gastroesophageal reflux disease)   . Headache(784.0)   . 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 WPW (Wolff-Parkinson-White syndrome)    See scanned history note    Past Surgical History:  Procedure Laterality Date  . BIOPSY BOWEL     small bowel biopsy- normal (colon plyps / colonoscopy 03/1999)  . BREAST BIOPSY Left 07/13/2013   Procedure: BREAST BIOPSY WITH NEEDLE LOCALIZATION;  Surgeon: Marcello Moores A. Cornett, MD;  Location: [facility];  Service: General;  Laterality: Left;  . BREAST SURGERY     Lt breast nodule - neg.  . CHOLECYSTECTOMY  2005   lap choli  . INNER EAR SURGERY  2011   Multiple surgeries Lt ear. Hearing device implanted Lt ear 2011.  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 TUBAL LIGATION      Social History   Social History  . Marital status: Legally Separated    Spouse name: N/A  . Number of children: N/A  . Years of education: N/A   Occupational History  . Not on file.   Social History Main Topics  . Smoking status: Former Smoker    Quit date: 04/20/1986  . Smokeless tobacco: Never Used  . Alcohol use 0.0 oz/week     Comment: 2-3 drinks a week  . Drug use: No  . Sexual activity: Not on file   Other Topics Concern  . Not on file   Social History Narrative  . No narrative on file  Family History  Problem Relation Age of Onset  . Cancer Mother     breast  . Diabetes Mother   . Stroke Mother   . Multiple sclerosis Mother   . Crohn's disease Brother     Allergies  Allergen Reactions  . Tetracycline     REACTION: rash, passed out    Medication list reviewed and updated in full in [facility].  GEN: No fevers, chills. Nontoxic. Primarily MSK c/o today. MSK: Detailed in the HPI GI: tolerating PO intake without difficulty Neuro: No numbness, parasthesias, or tingling associated. Otherwise the pertinent positives of the ROS are noted above.   Objective:   BP 124/74    Pulse 84   Temp 97.9 F (36.6 C) (Oral)   Ht 5\' 4"  (1.626 m)   Wt 162 lb 8 oz (73.7 kg)   LMP 11/07/2010   BMI 27.89 kg/m    GEN: Well-developed,well-nourished,in no acute distress; alert,appropriate and cooperative throughout examination HEENT: Normocephalic and atraumatic without obvious abnormalities. Ears, externally no deformities PULM: Breathing comfortably in no respiratory distress EXT: No clubbing, cyanosis, or edema PSYCH: Normally interactive. Cooperative during the interview. Pleasant. Friendly and conversant. Not anxious or depressed appearing. Normal, full affect.  CERVICAL SPINE EXAM Range of motion: Flexion, extension, lateral bending, and rotation: Full Pain with terminal motion: Minimal Spinous Processes: NT SCM: NT Upper paracervical muscles: Minimally tender to palpation Upper traps: NT C5-T1 intact, sensation and motor   Shoulder: L Inspection: No muscle wasting or winging Ecchymosis/edema: neg  AC joint, scapula, clavicle: NT Abduction: full, 5/5 Flexion: full, 5/5 IR, full, lift-off: 5/5 ER at neutral: full, 5/5 AC crossover and compression: neg Neer: neg Hawkins: mild-mod pos Drop Test: neg Empty Can: neg Supraspinatus insertion: NT Bicipital groove: NT Speed's: neg Yergason's: neg Sulcus sign: neg Scapular dyskinesis: none C5-T1 intact Sensation intact Grip 5/5   Radiology:  Assessment and Plan:   Left cervical radiculopathy  Impingement syndrome of left shoulder  Left knee pain, unspecified chronicity  Primary complaint of the patient right now is left cervical radiculopathy. She has also some minor impingement. Symptoms are only for 2 days. Likely both related and interrelated. Oral prednisone reasonable idea. Basic range of motion and stretching reasonable also.  Left knee is doing dramatically better. At this point no intervention likely will be needed at all.  Follow-up: When necessary only  Signed,  Salimatou Simone T. Myrtie Leuthold,  MD     Medication List       Accurate as of 03/18/16  1:13 PM. Always use your most recent med list.          diclofenac 75 MG EC tablet Commonly known as:  VOLTAREN Take 1 tablet (75 mg total) by mouth 2 (two) times daily.   OTOVEL 0.3-0.025 % Soln Generic drug:  ciprofloxacin-fluocinolone PF Place 1 drop into the left ear 2 (two) times daily.   predniSONE 10 MG tablet Commonly known as:  DELTASONE Take 3 pills once daily by mouth for 3 days, then 2 pills once daily for 3 days, then 1 pill once daily for 3 days and then stop   tiZANidine 4 MG tablet Commonly known as:  ZANAFLEX Take 1 tablet (4 mg total) by mouth at bedtime as needed. 1/2 - 1 tablet by mouth four times a day as needed for headache.

## 2016-03-18 NOTE — Assessment & Plan Note (Signed)
L sided with tenderness Also recent cough cxr today

## 2016-03-18 NOTE — Assessment & Plan Note (Signed)
I suspect this is related to her shoulder and deltoid area pain (also positional)- but exam is non focal neurologically  pred taper given  Neg carpal tunnel signs Update if no imp  cxr today in light of cwp as well

## 2016-11-10 ENCOUNTER — Encounter: Payer: Self-pay | Admitting: Family Medicine

## 2016-11-10 ENCOUNTER — Ambulatory Visit (INDEPENDENT_AMBULATORY_CARE_PROVIDER_SITE_OTHER): Payer: Managed Care, Other (non HMO) | Admitting: Family Medicine

## 2016-11-10 VITALS — BP 136/86 | HR 82 | Temp 98.3°F | Ht 61.0 in | Wt 158.5 lb

## 2016-11-10 DIAGNOSIS — R432 Parageusia: Secondary | ICD-10-CM | POA: Diagnosis not present

## 2016-11-10 DIAGNOSIS — J0100 Acute maxillary sinusitis, unspecified: Secondary | ICD-10-CM

## 2016-11-10 DIAGNOSIS — J019 Acute sinusitis, unspecified: Secondary | ICD-10-CM | POA: Insufficient documentation

## 2016-11-10 MED ORDER — AMOXICILLIN-POT CLAVULANATE 875-125 MG PO TABS: 1.0000 | ORAL_TABLET | Freq: Two times a day (BID) | ORAL | 0 refills | Status: DC

## 2016-11-10 MED ORDER — FLUTICASONE PROPIONATE 50 MCG/ACT NA SUSP: 2.0000 | Freq: Every day | NASAL | 6 refills | Status: DC

## 2016-11-10 NOTE — Patient Instructions (Signed)
You may have sinus infection and inflammation causing your symptoms Take the augmentin  Use flonase Nasal saline spray is ok as needed as well (do not use the vics any longer)  Drink lots of water   Update if not starting to improve in a week or if worsening   We would need to get you to ENT (or make your own appt)

## 2016-11-10 NOTE — Assessment & Plan Note (Signed)
Suspect this is causing lack of taste and other symptoms Cover with augmentin  Also flonase Stop using vics products  Nasal saline is ok Update if not starting to improve in a week or if worsening   If not imp she will f/u with ENT (she is already due for a f/u anyway)

## 2016-11-10 NOTE — Progress Notes (Signed)
Subjective:    Patient ID: Felicia Robbins, female    DOB: [birth year], 61 y.o.   MRN: [id number]  HPI Here for congestion /uri symptoms and ST  Not sure when it started   She cannot taste anything / ? If smell is off R nostril is completely plugged for 2 mo  Now st with pnd   Had plane travel also to [place]    Some loose stool   Takes advil otc   Mucous has some dried blood in it  Mild sinus pain L maxillary No fever   Patient Active Problem List   Diagnosis Date Noted  . Loss of taste 11/10/2016  . Acute sinusitis 11/10/2016  . Paresthesia of hand 03/17/2016  . Left shoulder pain 03/17/2016  . Chest wall pain 03/17/2016  . Productive cough 03/17/2016  . Abdominal pain, left upper quadrant 03/17/2016  . Obesity 02/11/2016  . Lactose intolerance 02/11/2016  . Pain in joint, shoulder region 09/07/2014  . Stress reaction, emotional 01/19/2012  . Gynecologic exam normal 12/08/2010  . Routine general medical examination at a health care facility 11/29/2010  . Hot flashes 08/11/2010  . RHINITIS 08/15/2007  . FIBROCYSTIC BREAST DISEASE 09/24/2006  . HX, PERSONAL, COLONIC POLYPS 09/24/2006  . MIGRAINE, COMMON 08/25/2006  . DECREASED HEARING 08/25/2006  . Essential hypertension 08/25/2006  . Esophageal reflux 08/25/2006  . MIGRAINES, HX OF 08/25/2006   Past Medical History:  Diagnosis Date  . Depression   . GERD (gastroesophageal reflux disease)   . Headache(784.0)   . 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 WPW (Wolff-Parkinson-White syndrome)    See scanned history note   Past Surgical History:  Procedure Laterality Date  . BIOPSY BOWEL     small bowel biopsy- normal (colon plyps / colonoscopy 03/1999)  . BREAST BIOPSY Left 07/13/2013   Procedure: BREAST BIOPSY WITH NEEDLE LOCALIZATION;  Surgeon: Marcello Moores A. Cornett, MD;  Location: [facility];  Service: General;  Laterality: Left;  . BREAST SURGERY     Lt breast nodule - neg.  .  CHOLECYSTECTOMY  2005   lap choli  . INNER EAR SURGERY  2011   Multiple surgeries Lt ear. Hearing device implanted Lt ear 2011.  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 TUBAL LIGATION     Social History  Substance Use Topics  . Smoking status: Former Smoker    Quit date: 04/20/1986  . Smokeless tobacco: Never Used  . Alcohol use 0.0 oz/week     Comment: 2-3 drinks a week   Family History  Problem Relation Age of Onset  . Cancer Mother        breast  . Diabetes Mother   . Stroke Mother   . Multiple sclerosis Mother   . Crohn's disease Brother    Allergies  Allergen Reactions  . Tetracycline     REACTION: rash, passed out   Current Outpatient Prescriptions on File Prior to Visit  Medication Sig Dispense Refill  . tiZANidine (ZANAFLEX) 4 MG tablet Take 1 tablet (4 mg total) by mouth at bedtime as needed. 1/2 - 1 tablet by mouth four times a day as needed for headache. 30 tablet 11   No current facility-administered medications on file prior to visit.     Review of Systems  Constitutional: Positive for appetite change. Negative for fatigue and fever.  HENT: Positive for congestion, postnasal drip, rhinorrhea, sinus pressure and sore throat. Negative for  ear pain, mouth sores, nosebleeds and trouble swallowing.        Has baseline hearing loss   Eyes: Negative for pain, redness and itching.  Respiratory: Positive for cough. Negative for shortness of breath and wheezing.   Cardiovascular: Negative for chest pain.  Gastrointestinal: Negative for abdominal pain, diarrhea, nausea and vomiting.  Endocrine: Negative for polyuria.  Genitourinary: Negative for dysuria, frequency and urgency.  Musculoskeletal: Negative for arthralgias and myalgias.  Allergic/Immunologic: Negative for immunocompromised state.  Neurological: Positive for headaches. Negative for dizziness, tremors, syncope, weakness and numbness.  Hematological: Negative for adenopathy. Does not bruise/bleed  easily.  Psychiatric/Behavioral: Negative for dysphoric mood. The patient is not nervous/anxious.        Objective:   Physical Exam  Constitutional: She appears well-developed and well-nourished. No distress.  HENT:  Head: Normocephalic and atraumatic.  Right Ear: External ear normal.  Left Ear: External ear normal.  Mouth/Throat: Oropharynx is clear and moist. No oropharyngeal exudate.  Nares are injected and congested   (much worse on R with scabbing as well - unable to tell if there is a polyp) L maxillary sinus tenderness Post nasal drip  Mouth is clear   She is able to smell an alcohol swab today  Baseline abn of L ear canal  Eyes: Pupils are equal, round, and reactive to light. Conjunctivae and EOM are normal. Right eye exhibits no discharge. Left eye exhibits no discharge.  Neck: Normal range of motion. Neck supple.  Cardiovascular: Normal rate and regular rhythm.   Pulmonary/Chest: Effort normal and breath sounds normal. No respiratory distress. She has no wheezes. She has no rales.  Lymphadenopathy:    She has no cervical adenopathy.  Neurological: She is alert. No cranial nerve deficit.  Skin: Skin is warm and dry. No rash noted. No pallor.  Psychiatric: She has a normal mood and affect.          Assessment & Plan:   Problem List Items Addressed This Visit      Respiratory   Acute sinusitis    Suspect this is causing lack of taste and other symptoms Cover with augmentin  Also flonase Stop using vics products  Nasal saline is ok Update if not starting to improve in a week or if worsening   If not imp she will f/u with ENT (she is already due for a f/u anyway)      Relevant Medications   amoxicillin-clavulanate (AUGMENTIN) 875-125 MG tablet   fluticasone (FLONASE) 50 MCG/ACT nasal spray     Other   Loss of taste    Nl oral exam Suspect due to chronic congestion and sinusitis tx with augmentin and flonase  If no imp will f/u with ENT

## 2016-11-10 NOTE — Assessment & Plan Note (Signed)
Nl oral exam Suspect due to chronic congestion and sinusitis tx with augmentin and flonase  If no imp will f/u with ENT

## 2016-11-30 IMAGING — DX DG CHEST 2V
2 series · 2 of 2 positions shown · non-contrast
Comparison: 04/04/2015

CLINICAL DATA: Chest wall pain for 1 day on the left.

EXAM:
CHEST  2 VIEW

[chest pa]
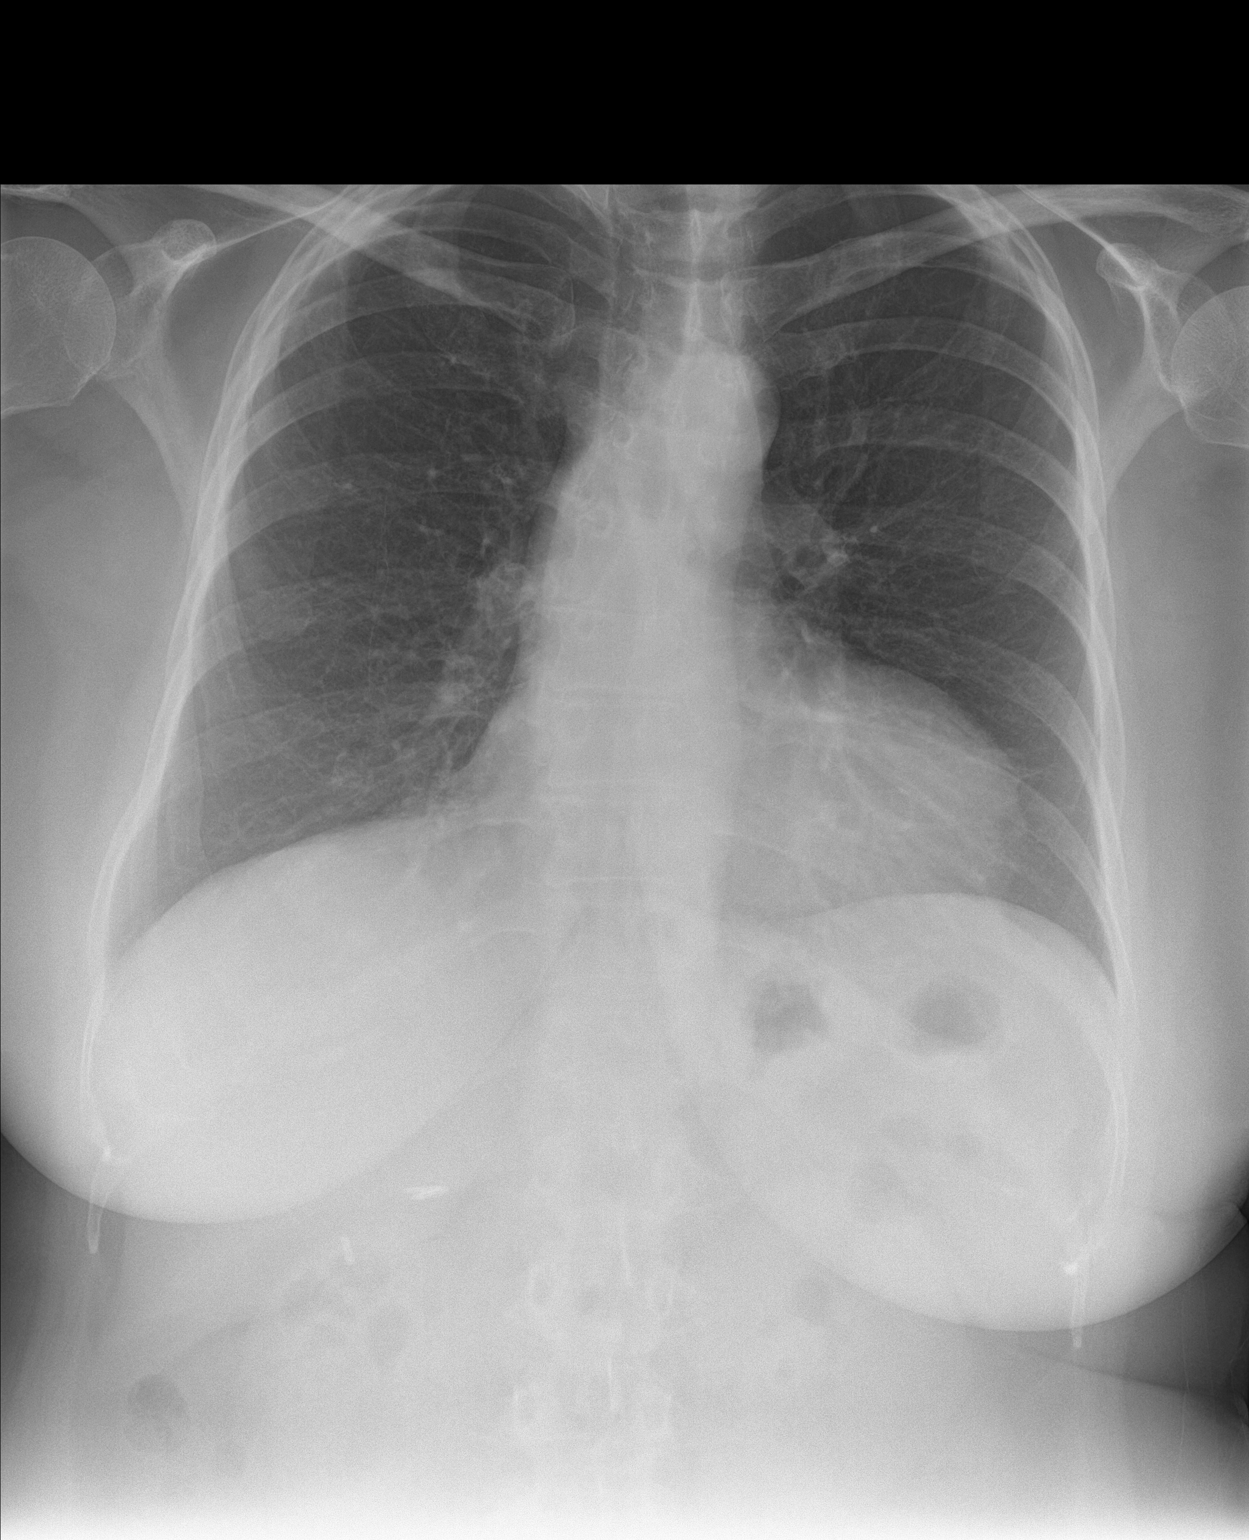

[chest lat]
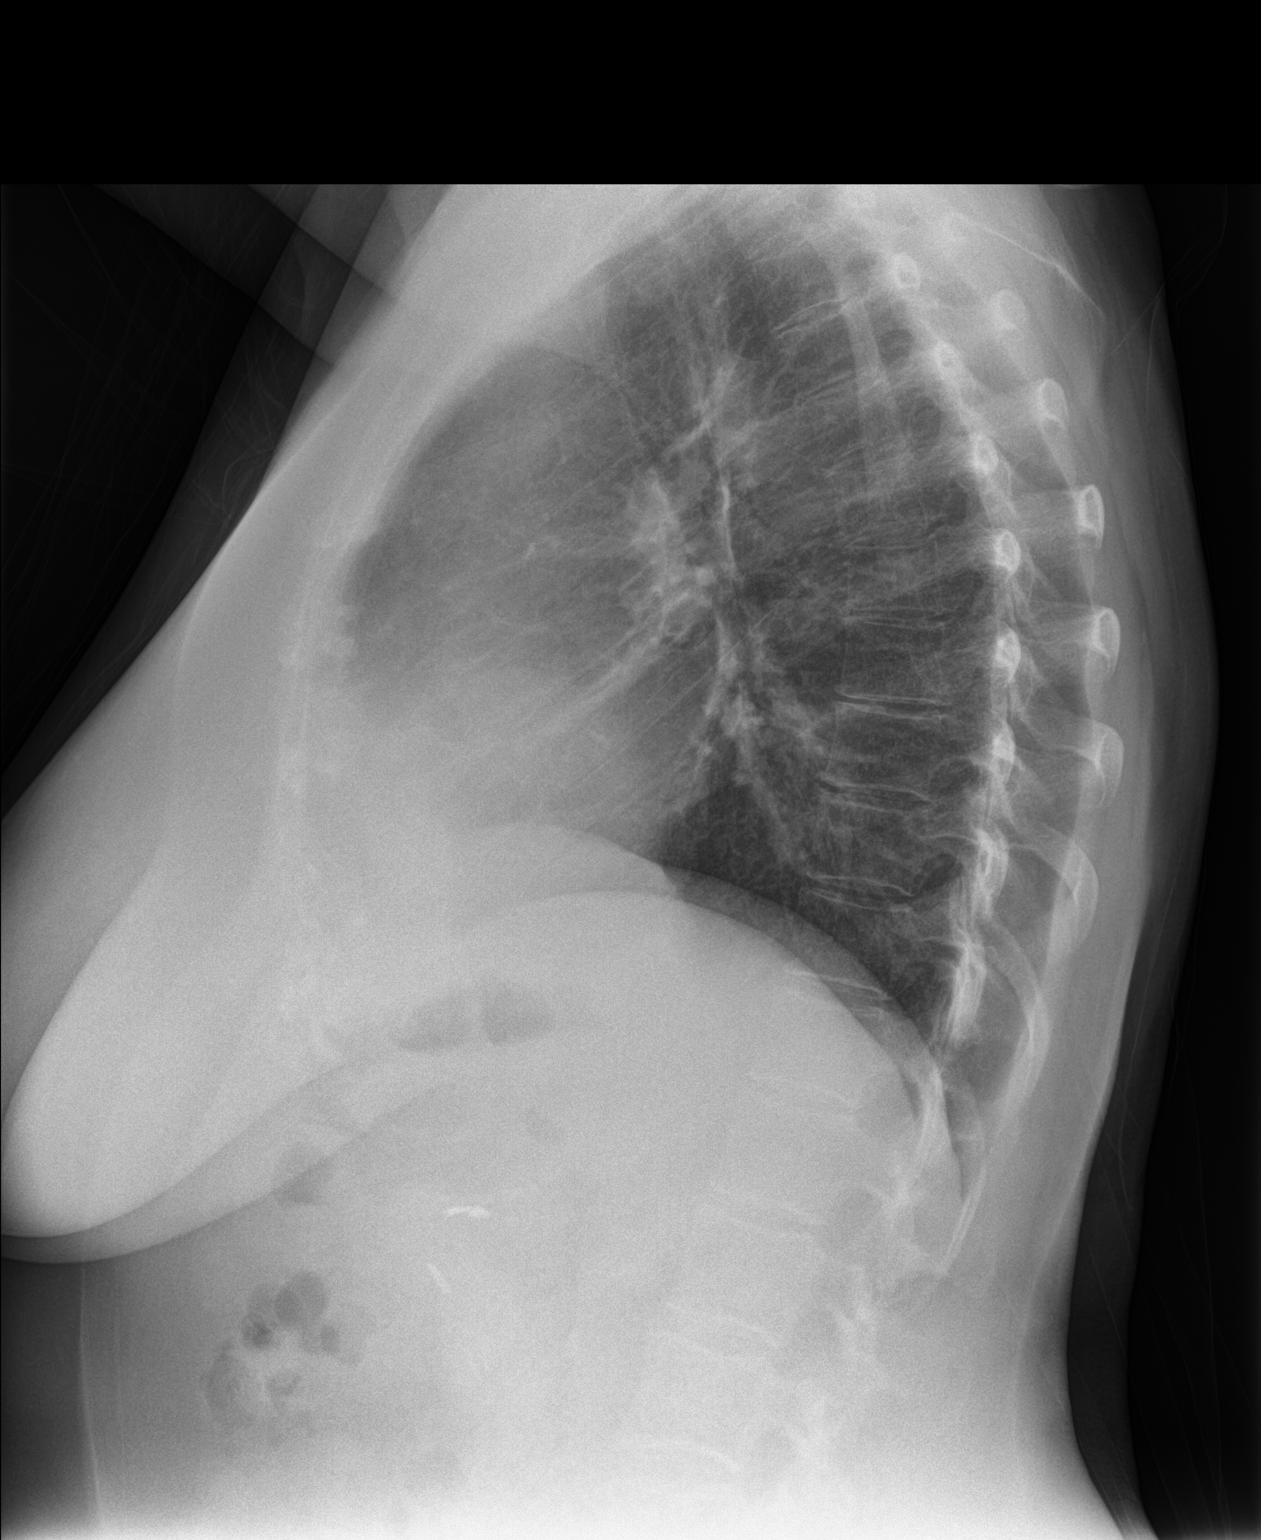

[2 of 2 positions shown; findings below may reference images not displayed]

FINDINGS: Normal heart size and mediastinal contours. No acute infiltrate or
edema. No effusion or pneumothorax. Mild upper thoracic
levoscoliosis. No acute osseous findings. Cholecystectomy clips.
IMPRESSION: No acute finding.

Mild upper thoracic scoliosis.

## 2016-12-30 ENCOUNTER — Ambulatory Visit (INDEPENDENT_AMBULATORY_CARE_PROVIDER_SITE_OTHER): Payer: 59 | Admitting: Family Medicine

## 2016-12-30 ENCOUNTER — Encounter: Payer: Self-pay | Admitting: Family Medicine

## 2016-12-30 VITALS — BP 126/70 | HR 80 | Temp 99.0°F | Ht 61.0 in | Wt 163.2 lb

## 2016-12-30 DIAGNOSIS — B373 Candidiasis of vulva and vagina: Secondary | ICD-10-CM

## 2016-12-30 DIAGNOSIS — B3731 Acute candidiasis of vulva and vagina: Secondary | ICD-10-CM | POA: Insufficient documentation

## 2016-12-30 LAB — POCT WET PREP (WET MOUNT)
KOH Wet Prep POC: POSITIVE
Trichomonas Wet Prep HPF POC: ABSENT

## 2016-12-30 MED ORDER — FLUCONAZOLE 150 MG PO TABS: 150.0000 mg | ORAL_TABLET | Freq: Once | ORAL | 1 refills | Status: AC

## 2016-12-30 MED ORDER — TERCONAZOLE 0.8 % VA CREA: TOPICAL_CREAM | VAGINAL | 0 refills | Status: DC

## 2016-12-30 NOTE — Progress Notes (Signed)
Subjective:    Patient ID: Felicia Robbins, female    DOB: 26-Mar-1956, 61 y.o.   MRN: [id number]  HPI Here for vaginal symptoms  Wt Readings from Last 3 Encounters:  12/30/16 163 lb 4 oz (74 kg)  11/10/16 158 lb 8 oz (71.9 kg)  03/18/16 162 lb 8 oz (73.7 kg)   Pap was 12/16 normal at gyn   She had a yeast infection  Treated with 1 day Monitstat kit over the counter  Improved but not gone   Did another treatment a week later (internal and external)  That was last Thursday   A lot of vaginal discharge  Inside of her R thigh was red also  Itching but not burning as much as it was  More on the outside now  Odor is gone now   No pelvic pain or cramping No bleeding   Not generally prone to yeast infections  No worries of stds - no sex in years   Saw ENT Will start prednisone  Getting MRI  Did a nasal scope   Results for orders placed or performed in visit on 12/30/16  POCT Wet Prep [employer])  Result Value Ref Range   Source Wet Prep POC vaginal    WBC, Wet Prep HPF POC few    Bacteria Wet Prep HPF POC Few Few   BACTERIA WET PREP MORPHOLOGY POC     Clue Cells Wet Prep HPF POC None None   Clue Cells Wet Prep Whiff POC     Yeast Wet Prep HPF POC Moderate    KOH Wet Prep POC pos    Trichomonas Wet Prep HPF POC Absent Absent    Will not get flu shot since she will be starting prednisone soon from ENT  Patient Active Problem List   Diagnosis Date Noted  . Yeast vaginitis 12/30/2016  . Loss of taste 11/10/2016  . Acute sinusitis 11/10/2016  . Paresthesia of hand 03/17/2016  . Left shoulder pain 03/17/2016  . Chest wall pain 03/17/2016  . Productive cough 03/17/2016  . Abdominal pain, left upper quadrant 03/17/2016  . Obesity 02/11/2016  . Lactose intolerance 02/11/2016  . Pain in joint, shoulder region 09/07/2014  . Stress reaction, emotional 01/19/2012  . Gynecologic exam normal 12/08/2010  . Routine general medical examination at a health care facility  11/29/2010  . Hot flashes 08/11/2010  . RHINITIS 08/15/2007  . FIBROCYSTIC BREAST DISEASE 09/24/2006  . HX, PERSONAL, COLONIC POLYPS 09/24/2006  . MIGRAINE, COMMON 08/25/2006  . DECREASED HEARING 08/25/2006  . Essential hypertension 08/25/2006  . Esophageal reflux 08/25/2006  . MIGRAINES, HX OF 08/25/2006   Past Medical History:  Diagnosis Date  . Depression   . GERD (gastroesophageal reflux disease)   . Headache(784.0)   . 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 WPW (Wolff-Parkinson-White syndrome)    See scanned history note   Past Surgical History:  Procedure Laterality Date  . BIOPSY BOWEL     small bowel biopsy- normal (colon plyps / colonoscopy 03/1999)  . BREAST BIOPSY Left 07/13/2013   Procedure: BREAST BIOPSY WITH NEEDLE LOCALIZATION;  Surgeon: Marcello Moores A. Cornett, MD;  Location: [facility];  Service: General;  Laterality: Left;  . BREAST SURGERY     Lt breast nodule - neg.  . CHOLECYSTECTOMY  2005   lap choli  . INNER EAR SURGERY  2011   Multiple surgeries Lt ear. Hearing device implanted Lt ear 2011.  Marland Kitchen PAROTID  GLAND TUMOR EXCISION  2000   left  . TUBAL LIGATION     Social History  Substance Use Topics  . Smoking status: Former Smoker    Quit date: 04/20/1986  . Smokeless tobacco: Never Used  . Alcohol use 0.0 oz/week     Comment: 2-3 drinks a week   Family History  Problem Relation Age of Onset  . Cancer Mother        breast  . Diabetes Mother   . Stroke Mother   . Multiple sclerosis Mother   . Crohn's disease Brother    Allergies  Allergen Reactions  . Tetracycline     REACTION: rash, passed out   Current Outpatient Prescriptions on File Prior to Visit  Medication Sig Dispense Refill  . tiZANidine (ZANAFLEX) 4 MG tablet Take 1 tablet (4 mg total) by mouth at bedtime as needed. 1/2 - 1 tablet by mouth four times a day as needed for headache. 30 tablet 11   No current facility-administered medications on file prior to visit.       Review of Systems  Constitutional: Negative for activity change, appetite change, fatigue, fever and unexpected weight change.  HENT: Negative for congestion, ear pain, rhinorrhea, sinus pressure and sore throat.        Pos for decrease in sense of taste   Eyes: Negative for pain, redness and visual disturbance.  Respiratory: Negative for cough, shortness of breath and wheezing.   Cardiovascular: Negative for chest pain and palpitations.  Gastrointestinal: Negative for abdominal pain, blood in stool, constipation and diarrhea.  Endocrine: Negative for polydipsia and polyuria.  Genitourinary: Negative for dysuria, frequency and urgency.  Musculoskeletal: Negative for arthralgias, back pain and myalgias.  Skin: Negative for pallor and rash.  Allergic/Immunologic: Negative for environmental allergies.  Neurological: Negative for dizziness, syncope and headaches.  Hematological: Negative for adenopathy. Does not bruise/bleed easily.  Psychiatric/Behavioral: Negative for decreased concentration and dysphoric mood. The patient is not nervous/anxious.        Objective:   Physical Exam  Constitutional: She appears well-developed and well-nourished. No distress.  HENT:  Head: Normocephalic and atraumatic.  Eyes: Pupils are equal, round, and reactive to light. Conjunctivae and EOM are normal. No scleral icterus.  Neck: Normal range of motion. Neck supple.  Cardiovascular: Normal rate and regular rhythm.   Pulmonary/Chest: Effort normal and breath sounds normal.  Abdominal: Soft. Bowel sounds are normal. She exhibits no distension and no mass. There is tenderness. There is no rebound and no guarding.  Mild suprapubic tenderness   Genitourinary:  Genitourinary Comments: Pale vaginal d/c w/o odor  No excoriations or swelling or redness of ext genitalia  No lesions  Vaginal mucosa is mildly hyperemic Budding yeast on wet prep  Lymphadenopathy:    She has no cervical adenopathy.  Skin:  Skin is warm and dry. No rash noted.  Psychiatric: She has a normal mood and affect.          Assessment & Plan:   Problem List Items Addressed This Visit      Genitourinary   Yeast vaginitis    Improved but not resolved with 2 doses of monistat 1 day tx otc  Wet prep shows yeast buds  She is taking a probiotic  Will tx with diflucan 150 mg times one  terazol cream externally qd prn itch  Update if not starting to improve in a week or if worsening   Of note- she will be starting prednisone soon- if vaginitis returns  may need to repeat tx       Relevant Medications   fluconazole (DIFLUCAN) 150 MG tablet   Other Relevant Orders   POCT Wet Prep [facility])

## 2016-12-30 NOTE — Assessment & Plan Note (Signed)
Improved but not resolved with 2 doses of monistat 1 day tx otc  Wet prep shows yeast buds  She is taking a probiotic  Will tx with diflucan 150 mg times one  terazol cream externally qd prn itch  Update if not starting to improve in a week or if worsening   Of note- she will be starting prednisone soon- if vaginitis returns may need to repeat tx

## 2016-12-30 NOTE — Patient Instructions (Signed)
I think you have a persistent yeast infection  Take the diflucan orally one pill  Use the terazol cream as needed daily as well to affected external areas   Update if not starting to improve in a week or if worsening

## 2017-01-08 ENCOUNTER — Other Ambulatory Visit: Payer: Self-pay | Admitting: Otolaryngology

## 2017-01-08 DIAGNOSIS — R438 Other disturbances of smell and taste: Secondary | ICD-10-CM

## 2017-01-13 ENCOUNTER — Ambulatory Visit
Admission: RE | Admit: 2017-01-13 | Discharge: 2017-01-13 | Disposition: A | Discharge status: Home / Self Care | Payer: 59 | Source: Ambulatory Visit | Attending: Otolaryngology | Admitting: Otolaryngology

## 2017-01-13 DIAGNOSIS — R438 Other disturbances of smell and taste: Secondary | ICD-10-CM

## 2017-01-13 MED ORDER — IOPAMIDOL (ISOVUE-300) INJECTION 61%
75.0000 mL | Freq: Once | INTRAVENOUS | Status: AC | PRN
  Administered 2017-01-13: 75 mL via INTRAVENOUS

## 2017-01-31 ENCOUNTER — Telehealth: Payer: Self-pay | Admitting: Family Medicine

## 2017-01-31 DIAGNOSIS — I10 Essential (primary) hypertension: Secondary | ICD-10-CM

## 2017-01-31 DIAGNOSIS — Z0000 Encounter for general adult medical examination without abnormal findings: Secondary | ICD-10-CM

## 2017-01-31 NOTE — Telephone Encounter (Signed)
-----   Message from Ellamae Sia sent at 01/27/2017 10:53 AM EDT ----- Regarding: Lab orders for Thursday, 10.18.18 Patient is scheduled for CPX labs, please order future labs, Thanks , Karna Christmas

## 2017-02-04 ENCOUNTER — Other Ambulatory Visit (INDEPENDENT_AMBULATORY_CARE_PROVIDER_SITE_OTHER): Payer: 59

## 2017-02-04 DIAGNOSIS — I10 Essential (primary) hypertension: Secondary | ICD-10-CM | POA: Diagnosis not present

## 2017-02-04 LAB — COMPREHENSIVE METABOLIC PANEL
ALT: 17 U/L (ref 0–35)
AST: 19 U/L (ref 0–37)
Albumin: 4.4 g/dL (ref 3.5–5.2)
Alkaline Phosphatase: 70 U/L (ref 39–117)
BUN: 14 mg/dL (ref 6–23)
CALCIUM: 9.6 mg/dL (ref 8.4–10.5)
CHLORIDE: 105 meq/L (ref 96–112)
CO2: 29 meq/L (ref 19–32)
CREATININE: 0.71 mg/dL (ref 0.40–1.20)
GFR: 89.16 mL/min (ref >60.00)
GLUCOSE: 111 mg/dL — AB (ref 70–99)
Potassium: 4.4 mEq/L (ref 3.5–5.1)
SODIUM: 141 meq/L (ref 135–145)
Total Bilirubin: 0.5 mg/dL (ref 0.2–1.2)
Total Protein: 7.3 g/dL (ref 6.0–8.3)

## 2017-02-04 LAB — CBC WITH DIFFERENTIAL/PLATELET
Basophils Absolute: 0 10*3/uL (ref 0.0–0.1)
Basophils Relative: 0.4 % (ref 0.0–3.0)
EOS ABS: 0.2 10*3/uL (ref 0.0–0.7)
Eosinophils Relative: 2.7 % (ref 0.0–5.0)
HCT: 40.8 % (ref 36.0–46.0)
HEMOGLOBIN: 13.5 g/dL (ref 12.0–15.0)
LYMPHS PCT: 34 % (ref 12.0–46.0)
Lymphs Abs: 2.1 10*3/uL (ref 0.7–4.0)
MCHC: 33.1 g/dL (ref 30.0–36.0)
MCV: 94.9 fl (ref 78.0–100.0)
MONO ABS: 0.6 10*3/uL (ref 0.1–1.0)
Monocytes Relative: 9.3 % (ref 3.0–12.0)
Neutro Abs: 3.3 10*3/uL (ref 1.4–7.7)
Neutrophils Relative %: 53.6 % (ref 43.0–77.0)
Platelets: 271 10*3/uL (ref 150.0–400.0)
RBC: 4.3 Mil/uL (ref 3.87–5.11)
RDW: 14.1 % (ref 11.5–15.5)
WBC: 6.3 10*3/uL (ref 4.0–10.5)

## 2017-02-04 LAB — LIPID PANEL
CHOL/HDL RATIO: 3
Cholesterol: 237 mg/dL — ABNORMAL HIGH (ref 0–200)
HDL: 76.8 mg/dL (ref >39.00)
LDL CALC: 137 mg/dL — AB (ref 0–99)
NONHDL: 160.46
TRIGLYCERIDES: 116 mg/dL (ref 0.0–149.0)
VLDL: 23.2 mg/dL (ref 0.0–40.0)

## 2017-02-04 LAB — TSH: TSH: 0.67 u[IU]/mL (ref 0.35–4.50)

## 2017-02-05 ENCOUNTER — Other Ambulatory Visit: Payer: Managed Care, Other (non HMO)

## 2017-02-12 ENCOUNTER — Encounter: Payer: Managed Care, Other (non HMO) | Admitting: Family Medicine

## 2017-03-17 ENCOUNTER — Encounter: Payer: Self-pay | Admitting: Family Medicine

## 2017-03-17 ENCOUNTER — Ambulatory Visit (INDEPENDENT_AMBULATORY_CARE_PROVIDER_SITE_OTHER): Payer: 59 | Admitting: Family Medicine

## 2017-03-17 VITALS — BP 118/78 | HR 92 | Temp 98.5°F | Ht 61.25 in | Wt 160.5 lb

## 2017-03-17 DIAGNOSIS — E7800 Pure hypercholesterolemia, unspecified: Secondary | ICD-10-CM | POA: Diagnosis not present

## 2017-03-17 DIAGNOSIS — E6609 Other obesity due to excess calories: Secondary | ICD-10-CM | POA: Diagnosis not present

## 2017-03-17 DIAGNOSIS — Z6831 Body mass index (BMI) 31.0-31.9, adult: Secondary | ICD-10-CM | POA: Diagnosis not present

## 2017-03-17 DIAGNOSIS — R7309 Other abnormal glucose: Secondary | ICD-10-CM

## 2017-03-17 DIAGNOSIS — I10 Essential (primary) hypertension: Secondary | ICD-10-CM

## 2017-03-17 DIAGNOSIS — Z0000 Encounter for general adult medical examination without abnormal findings: Secondary | ICD-10-CM

## 2017-03-17 DIAGNOSIS — E785 Hyperlipidemia, unspecified: Secondary | ICD-10-CM | POA: Insufficient documentation

## 2017-03-17 MED ORDER — TIZANIDINE HCL 4 MG PO TABS: 4.0000 mg | ORAL_TABLET | Freq: Every evening | ORAL | 11 refills | Status: DC | PRN

## 2017-03-17 NOTE — Patient Instructions (Addendum)
Don't forget your mammogram and visit at the gyn office !   For cholesterol: Avoid red meat/ fried foods/ egg yolks/ fatty breakfast meats/ butter, cheese and high fat dairy/ and shellfish    For glucose levels- watch carbs Try to get most of your carbohydrates from produce (with the exception of white potatoes)  Eat less bread/pasta/rice/snack foods/cereals/sweets and other items from the middle of the grocery store (processed carbs)  Add exercise - 30 minutes at least 5 days per week for better health

## 2017-03-17 NOTE — Progress Notes (Signed)
Subjective:    Patient ID: Felicia Robbins, female    DOB: May 01, 1955, 61 y.o.   MRN: [id number]  HPI Here for health maintenance exam and to review chronic medical problems    Doing well overall    Wt Readings from Last 3 Encounters:  03/17/17 160 lb 8 oz (72.8 kg)  12/30/16 163 lb 4 oz (74 kg)  11/10/16 158 lb 8 oz (71.9 kg)  down a bit  Same diet  Exercise -walks a lot but not for the sake of exercise - parks farther/etc  30.08 kg/m   Flu shot -had it in October at work   [place] 12/16- due for one  [facility]  Missed gyn visit last year  Self breast exam-no lumps   Goes for derm visits for screening- will have abn mole removed early next month  She is good about sun protection now (not as a kid)   Colonoscopy 2/09 10 year recall   Pap 12/16 nl   Tetanus shot 2/14  zostavax 8/12  bp is stable today  No cp or palpitations or headaches or edema  No medications- just controlling with lifestyle  BP Readings from Last 3 Encounters:  03/17/17 118/78  12/30/16 126/70  11/10/16 136/86     Cholesterol Lab Results  Component Value Date   CHOL 237 (H) 02/04/2017   CHOL 187 02/06/2016   CHOL 234 (H) 01/29/2015   Lab Results  Component Value Date   HDL 76.80 02/04/2017   HDL 65.50 02/06/2016   HDL 84.40 01/29/2015   Lab Results  Component Value Date   LDLCALC 137 (H) 02/04/2017   LDLCALC 105 (H) 02/06/2016   LDLCALC 130 (H) 01/29/2015   Lab Results  Component Value Date   TRIG 116.0 02/04/2017   TRIG 81.0 02/06/2016   TRIG 97.0 01/29/2015   Lab Results  Component Value Date   CHOLHDL 3 02/04/2017   CHOLHDL 3 02/06/2016   CHOLHDL 3 01/29/2015   Lab Results  Component Value Date   LDLDIRECT 121.7 12/27/2012   LDLDIRECT 116.4 01/12/2012   LDLDIRECT 122.6 12/02/2010    LDL is up  Ate a lot of fatty foods months prior with travel   Lab Results  Component Value Date   WBC 6.3 02/04/2017   HGB 13.5 02/04/2017    HCT 40.8 02/04/2017   MCV 94.9 02/04/2017   PLT 271.0 02/04/2017   glucose was 111 - was fasting   Lab Results  Component Value Date   CREATININE 0.71 02/04/2017   BUN 14 02/04/2017   NA 141 02/04/2017   K 4.4 02/04/2017   CL 105 02/04/2017   CO2 29 02/04/2017   Lab Results  Component Value Date   ALT 17 02/04/2017   AST 19 02/04/2017   ALKPHOS 70 02/04/2017   BILITOT 0.5 02/04/2017    Lab Results  Component Value Date   TSH 0.67 02/04/2017     Patient Active Problem List   Diagnosis Date Noted  . Hyperlipidemia 03/17/2017  . Elevated glucose level 03/17/2017  . Yeast vaginitis 12/30/2016  . Loss of taste 11/10/2016  . Paresthesia of hand 03/17/2016  . Left shoulder pain 03/17/2016  . Productive cough 03/17/2016  . Obesity 02/11/2016  . Lactose intolerance 02/11/2016  . Pain in joint, shoulder region 09/07/2014  . Stress reaction, emotional 01/19/2012  . Gynecologic exam normal 12/08/2010  . Routine general medical examination at a health care facility 11/29/2010  .  Hot flashes 08/11/2010  . RHINITIS 08/15/2007  . FIBROCYSTIC BREAST DISEASE 09/24/2006  . HX, PERSONAL, COLONIC POLYPS 09/24/2006  . MIGRAINE, COMMON 08/25/2006  . DECREASED HEARING 08/25/2006  . Essential hypertension 08/25/2006  . Esophageal reflux 08/25/2006  . MIGRAINES, HX OF 08/25/2006   Past Medical History:  Diagnosis Date  . Depression   . GERD (gastroesophageal reflux disease)   . Headache(784.0)   . 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 WPW (Wolff-Parkinson-White syndrome)    See scanned history note   Past Surgical History:  Procedure Laterality Date  . BIOPSY BOWEL     small bowel biopsy- normal (colon plyps / colonoscopy 03/1999)  . BREAST BIOPSY Left 07/13/2013   Procedure: BREAST BIOPSY WITH NEEDLE LOCALIZATION;  Surgeon: Marcello Moores A. Cornett, MD;  Location: [facility];  Service: General;  Laterality: Left;  . BREAST SURGERY     Lt breast nodule -  neg.  . CHOLECYSTECTOMY  2005   lap choli  . INNER EAR SURGERY  2011   Multiple surgeries Lt ear. Hearing device implanted Lt ear 2011.  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 TUBAL LIGATION     Social History   Tobacco Use  . Smoking status: Former Smoker    Last attempt to quit: 04/20/1986    Years since quitting: 30.9  . Smokeless tobacco: Never Used  Substance Use Topics  . Alcohol use: Yes    Alcohol/week: 0.0 oz    Comment: 2-3 drinks a week  . Drug use: No   Family History  Problem Relation Age of Onset  . Cancer Mother        breast  . Diabetes Mother   . Stroke Mother   . Multiple sclerosis Mother   . Crohn's disease Brother    Allergies  Allergen Reactions  . Tetracycline     REACTION: rash, passed out   No current outpatient medications on file prior to visit.   No current facility-administered medications on file prior to visit.     Review of Systems  Constitutional: Positive for fatigue. Negative for activity change, appetite change, fever and unexpected weight change.  HENT: Negative for congestion, ear pain, rhinorrhea, sinus pressure and sore throat.   Eyes: Negative for pain, redness and visual disturbance.  Respiratory: Negative for cough, shortness of breath and wheezing.   Cardiovascular: Negative for chest pain and palpitations.  Gastrointestinal: Negative for abdominal pain, blood in stool, constipation and diarrhea.  Endocrine: Negative for polydipsia and polyuria.  Genitourinary: Negative for dysuria, frequency and urgency.  Musculoskeletal: Negative for arthralgias, back pain and myalgias.  Skin: Negative for pallor and rash.  Allergic/Immunologic: Negative for environmental allergies.  Neurological: Positive for headaches. Negative for dizziness and syncope.  Hematological: Negative for adenopathy. Does not bruise/bleed easily.  Psychiatric/Behavioral: Negative for decreased concentration and dysphoric mood. The patient is not  nervous/anxious.        Objective:   Physical Exam  Constitutional: She appears well-developed and well-nourished. No distress.  obese and well appearing   HENT:  Head: Normocephalic and atraumatic.  Right Ear: External ear normal.  Left Ear: External ear normal.  Mouth/Throat: Oropharynx is clear and moist.  Eyes: Conjunctivae and EOM are normal. Pupils are equal, round, and reactive to light. No scleral icterus.  Neck: Normal range of motion. Neck supple. No JVD present. Carotid bruit is not present. No thyromegaly present.  Cardiovascular: Normal rate, regular rhythm, normal  heart sounds and intact distal pulses. Exam reveals no gallop.  Pulmonary/Chest: Effort normal and breath sounds normal. No respiratory distress. She has no wheezes. She exhibits no tenderness.  Abdominal: Soft. Bowel sounds are normal. She exhibits no distension, no abdominal bruit and no mass. There is no tenderness.  Genitourinary: No breast swelling, tenderness, discharge or bleeding.  Genitourinary Comments: Breast exam: No mass, nodules, thickening, tenderness, bulging, retraction, inflamation, nipple discharge or skin changes noted.  No axillary or clavicular LA.      Musculoskeletal: Normal range of motion. She exhibits no edema or tenderness.  Lymphadenopathy:    She has no cervical adenopathy.  Neurological: She is alert. She has normal reflexes. No cranial nerve deficit. She exhibits normal muscle tone. Coordination normal.  Skin: Skin is warm and dry. No rash noted. No erythema. No pallor.  Lentigines diffusely Some brown nevi on trunk  Psychiatric: She has a normal mood and affect.          Assessment & Plan:   Problem List Items Addressed This Visit      Cardiovascular and Mediastinum   Essential hypertension - Primary    bp in fair control at this time  BP Readings from Last 1 Encounters:  03/17/17 118/78   No changes needed Disc lifstyle change with low sodium diet and exercise           Other   Elevated glucose level    111 fasting Disc better eating and wt loss to prev DM Will check A1C for next draw      Hyperlipidemia    Disc goals for lipids and reasons to control them Rev labs with pt Rev low sat fat diet in detail LDL is up  Given info on diet changes and better habits      Obesity    Discussed how this problem influences overall health and the risks it imposes  Reviewed plan for weight loss with lower calorie diet (via better food choices and also portion control or program like weight watchers) and exercise building up to or more than 30 minutes 5 days per week including some aerobic activity         Routine general medical examination at a health care facility    Reviewed health habits including diet and exercise and skin cancer prevention Reviewed appropriate screening tests for age  Also reviewed health mt list, fam hx and immunization status , as well as social and family history   See HPI Labs rev  Will work on better diet and wt loss for cholesterol and glucose issues  She will f/u with gyn for her gyn care and also to get her mammogram

## 2017-03-18 NOTE — Assessment & Plan Note (Signed)
Reviewed health habits including diet and exercise and skin cancer prevention Reviewed appropriate screening tests for age  Also reviewed health mt list, fam hx and immunization status , as well as social and family history   See HPI Labs rev  Will work on better diet and wt loss for cholesterol and glucose issues  She will f/u with gyn for her gyn care and also to get her mammogram

## 2017-03-18 NOTE — Assessment & Plan Note (Signed)
Discussed how this problem influences overall health and the risks it imposes  Reviewed plan for weight loss with lower calorie diet (via better food choices and also portion control or program like weight watchers) and exercise building up to or more than 30 minutes 5 days per week including some aerobic activity    

## 2017-03-18 NOTE — Assessment & Plan Note (Signed)
bp in fair control at this time  BP Readings from Last 1 Encounters:  03/17/17 118/78   No changes needed Disc lifstyle change with low sodium diet and exercise

## 2017-03-18 NOTE — Assessment & Plan Note (Signed)
Disc goals for lipids and reasons to control them Rev labs with pt Rev low sat fat diet in detail LDL is up  Given info on diet changes and better habits

## 2017-03-18 NOTE — Assessment & Plan Note (Signed)
111 fasting Disc better eating and wt loss to prev DM Will check A1C for next draw

## 2017-05-17 ENCOUNTER — Ambulatory Visit: Payer: 59 | Admitting: Internal Medicine

## 2017-05-17 ENCOUNTER — Encounter: Payer: Self-pay | Admitting: Internal Medicine

## 2017-05-17 VITALS — BP 142/82 | HR 88 | Temp 98.3°F | Wt 160.0 lb

## 2017-05-17 DIAGNOSIS — J00 Acute nasopharyngitis [common cold]: Secondary | ICD-10-CM | POA: Diagnosis not present

## 2017-05-17 DIAGNOSIS — J029 Acute pharyngitis, unspecified: Secondary | ICD-10-CM

## 2017-05-17 LAB — POCT RAPID STREP A (OFFICE): RAPID STREP A SCREEN: NEGATIVE

## 2017-05-17 MED ORDER — AMOXICILLIN-POT CLAVULANATE 875-125 MG PO TABS: 1.0000 | ORAL_TABLET | Freq: Two times a day (BID) | ORAL | 0 refills | Status: DC

## 2017-05-17 MED ORDER — METHYLPREDNISOLONE ACETATE 80 MG/ML IJ SUSP
80.0000 mg | Freq: Once | INTRAMUSCULAR | Status: AC
  Administered 2017-05-17: 80 mg via INTRAMUSCULAR

## 2017-05-17 MED ORDER — HYDROCOD POLST-CPM POLST ER 10-8 MG/5ML PO SUER: 5.0000 mL | Freq: Every evening | ORAL | 0 refills | Status: DC | PRN

## 2017-05-17 NOTE — Addendum Note (Signed)
Addended by: Lurlean Nanny on: 05/17/2017 11:11 AM   Modules accepted: Orders

## 2017-05-17 NOTE — Patient Instructions (Signed)
Upper Respiratory Infection, Adult Most upper respiratory infections (URIs) are caused by a virus. A URI affects the nose, throat, and upper air passages. The most common type of URI is often called "the common cold." Follow these instructions at home:  Take medicines only as told by your doctor.  Gargle warm saltwater or take cough drops to comfort your throat as told by your doctor.  Use a warm mist humidifier or inhale steam from a shower to increase air moisture. This may make it easier to breathe.  Drink enough fluid to keep your pee (urine) clear or pale yellow.  Eat soups and other clear broths.  Have a healthy diet.  Rest as needed.  Go back to work when your fever is gone or your doctor says it is okay. ? You may need to stay home longer to avoid giving your URI to others. ? You can also wear a face mask and wash your hands often to prevent spread of the virus.  Use your inhaler more if you have asthma.  Do not use any tobacco products, including cigarettes, chewing tobacco, or electronic cigarettes. If you need help quitting, ask your doctor. Contact a doctor if:  You are getting worse, not better.  Your symptoms are not helped by medicine.  You have chills.  You are getting more short of breath.  You have brown or red mucus.  You have yellow or brown discharge from your nose.  You have pain in your face, especially when you bend forward.  You have a fever.  You have puffy (swollen) neck glands.  You have pain while swallowing.  You have white areas in the back of your throat. Get help right away if:  You have very bad or constant: ? Headache. ? Ear pain. ? Pain in your forehead, behind your eyes, and over your cheekbones (sinus pain). ? Chest pain.  You have long-lasting (chronic) lung disease and any of the following: ? Wheezing. ? Long-lasting cough. ? Coughing up blood. ? A change in your usual mucus.  You have a stiff neck.  You have  changes in your: ? Vision. ? Hearing. ? Thinking. ? Mood. This information is not intended to replace advice given to you by your health care provider. Make sure you discuss any questions you have with your health care provider. Document Released: 09/23/2007 Document Revised: 12/08/2015 Document Reviewed: 07/12/2013 Elsevier Interactive Patient Education  2018 Elsevier Inc[employer]  

## 2017-05-17 NOTE — Progress Notes (Signed)
HPI  Pt presents to the clinic today with c/o headache, runny nose, ear pain, sore throat and cough. This started 1 week ago. She describes the headache as pressure. She is blowing clear mucous out of her nose. She describes the ear pain as pressure, but denies decreased hearing. She is having some difficulty swallowing. The cough is productive of yellow/green mucous. She has run low grade fevers but denies chills or body aches. She has tried Tylenol Cough and Cold and Alka Seltzer with minimal relief. She has had sick contacts.   Review of Systems      Past Medical History:  Diagnosis Date  . Depression   . GERD (gastroesophageal reflux disease)   . Headache(784.0)   . 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 WPW (Wolff-Parkinson-White syndrome)    See scanned history note    Family History  Problem Relation Age of Onset  . Cancer Mother        breast  . Diabetes Mother   . Stroke Mother   . Multiple sclerosis Mother   . Crohn's disease Brother     Social History   Socioeconomic History  . Marital status: Legally Separated    Spouse name: Not on file  . Number of children: Not on file  . Years of education: Not on file  . Highest education level: Not on file  Social Needs  . Financial resource strain: Not on file  . Food insecurity - worry: Not on file  . Food insecurity - inability: Not on file  . Transportation needs - medical: Not on file  . Transportation needs - non-medical: Not on file  Occupational History  . Not on file  Tobacco Use  . Smoking status: Former Smoker    Last attempt to quit: 04/20/1986    Years since quitting: 31.0  . Smokeless tobacco: Never Used  Substance and Sexual Activity  . Alcohol use: Yes    Alcohol/week: 0.0 oz    Comment: 2-3 drinks a week  . Drug use: No  . Sexual activity: Not on file  Other Topics Concern  . Not on file  Social History Narrative  . Not on file    Allergies  Allergen Reactions  . Tetracycline    REACTION: rash, passed out     Constitutional: Positive headache, fatigue and fever. Denies abrupt weight changes.  HEENT:  Positive ear pain, runny nose, sore throat. Denies eye redness, eye pain, pressure behind the eyes, facial pain, nasal congestion, ringing in the ears, wax buildup, or bloody nose. Respiratory: Positive cough. Denies difficulty breathing or shortness of breath.  Cardiovascular: Denies chest pain, chest tightness, palpitations or swelling in the hands or feet.   No other specific complaints in a complete review of systems (except as listed in HPI above).  Objective:   BP (!) 142/82 (BP Location: Right Arm, Patient Position: Sitting, Cuff Size: Normal)   Pulse 88   Temp 98.3 F (36.8 C) (Oral)   Wt 160 lb (72.6 kg)   LMP 11/07/2010   SpO2 97%   BMI 29.99 kg/m  Wt Readings from Last 3 Encounters:  05/17/17 160 lb (72.6 kg)  03/17/17 160 lb 8 oz (72.8 kg)  12/30/16 163 lb 4 oz (74 kg)     General: Appears her stated age, in NAD. HEENT: Head: normal shape and size, no sinus tenderness noted;  Ears: Tm's gray and intact, normal light reflex; Nose: mucosa pink and moist, septum midline; Throat/Mouth:  Teeth present, mucosa erythematous and moist, no exudate noted, no lesions or ulcerations noted.  Neck: No cervical lymphadenopathy.  Pulmonary/Chest: Normal effort and positive vesicular breath sounds. No respiratory distress. No wheezes, rales or ronchi noted.       Assessment & Plan:   Upper Respiratory Infection:  Get some rest and drink plenty of water Do salt water gargles for the sore throat eRx for Augmentin BID x 7 days eRx for Tussionex cough syrup  Sore Throat:  RST: negative 80 mg Depo IM today Can try Ibuprofen or salt water gargles  RTC as needed or if symptoms persist.   Webb Silversmith, NP

## 2017-10-27 ENCOUNTER — Encounter: Payer: Self-pay | Admitting: Family Medicine

## 2017-10-27 ENCOUNTER — Ambulatory Visit: Payer: 59 | Admitting: Family Medicine

## 2017-10-27 VITALS — BP 150/86 | HR 89 | Temp 99.0°F | Ht 61.25 in | Wt 152.8 lb

## 2017-10-27 DIAGNOSIS — J0100 Acute maxillary sinusitis, unspecified: Secondary | ICD-10-CM | POA: Diagnosis not present

## 2017-10-27 MED ORDER — AMOXICILLIN-POT CLAVULANATE 875-125 MG PO TABS: 1.0000 | ORAL_TABLET | Freq: Two times a day (BID) | ORAL | 0 refills | Status: DC

## 2017-10-27 NOTE — Progress Notes (Signed)
Subjective:    Patient ID: Felicia Robbins, female    DOB: 1955/06/05, 62 y.o.   MRN: [id number]  HPI Here for sinus headache  Over a week  Frontal and behind eyes  Teeth hurt in mid June- no dental dz/ xrays were normal  Got a little better and then worse   Now since July 3 rd - constant headaches (muscle relaxers do not help- not a migraine type)  Worse on the right  Congestion/nasal - nasal d/c is yellow  L ear feels plugged (does not drain)  Low grade temp    This am coughing - (coughed up some blood) -poss from nose/pnd  Mild cough with yellow phlegm for 2 weeks  Non smoker   otc - advil , and an allergy medicine (? What it was)   bp is up today- may be due to HA BP Readings from Last 3 Encounters:  10/27/17 (!) 150/86  05/17/17 (!) 142/82  03/17/17 118/78   Patient Active Problem List   Diagnosis Date Noted  . Hyperlipidemia 03/17/2017  . Elevated glucose level 03/17/2017  . Acute sinusitis 11/10/2016  . Obesity 02/11/2016  . Lactose intolerance 02/11/2016  . Stress reaction, emotional 01/19/2012  . Gynecologic exam normal 12/08/2010  . Routine general medical examination at a health care facility 11/29/2010  . Hot flashes 08/11/2010  . RHINITIS 08/15/2007  . FIBROCYSTIC BREAST DISEASE 09/24/2006  . HX, PERSONAL, COLONIC POLYPS 09/24/2006  . MIGRAINE, COMMON 08/25/2006  . DECREASED HEARING 08/25/2006  . Essential hypertension 08/25/2006  . Esophageal reflux 08/25/2006  . MIGRAINES, HX OF 08/25/2006   Past Medical History:  Diagnosis Date  . Depression   . GERD (gastroesophageal reflux disease)   . Headache(784.0)   . 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 WPW (Wolff-Parkinson-White syndrome)    See scanned history note   Past Surgical History:  Procedure Laterality Date  . BIOPSY BOWEL     small bowel biopsy- normal (colon plyps / colonoscopy 03/1999)  . BREAST BIOPSY Left 07/13/2013   Procedure: BREAST BIOPSY WITH NEEDLE LOCALIZATION;   Surgeon: Marcello Moores A. Cornett, MD;  Location: [facility];  Service: General;  Laterality: Left;  . BREAST SURGERY     Lt breast nodule - neg.  . CHOLECYSTECTOMY  2005   lap choli  . INNER EAR SURGERY  2011   Multiple surgeries Lt ear. Hearing device implanted Lt ear 2011.  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 TUBAL LIGATION     Social History   Tobacco Use  . Smoking status: Former Smoker    Last attempt to quit: 04/20/1986    Years since quitting: 31.5  . Smokeless tobacco: Never Used  Substance Use Topics  . Alcohol use: Yes    Alcohol/week: 0.0 oz    Comment: 2-3 drinks a week  . Drug use: No   Family History  Problem Relation Age of Onset  . Cancer Mother        breast  . Diabetes Mother   . Stroke Mother   . Multiple sclerosis Mother   . Crohn's disease Brother    Allergies  Allergen Reactions  . Tetracycline     REACTION: rash, passed out   Current Outpatient Medications on File Prior to Visit  Medication Sig Dispense Refill  . tiZANidine (ZANAFLEX) 4 MG tablet Take 1 tablet (4 mg total) by mouth at bedtime as needed. 1/2 - 1 tablet  by mouth four times a day as needed for headache. 30 tablet 11   No current facility-administered medications on file prior to visit.     Review of Systems  Constitutional: Positive for appetite change. Negative for fatigue and fever.  HENT: Positive for congestion, ear pain, postnasal drip, rhinorrhea, sinus pressure and sore throat. Negative for nosebleeds.   Eyes: Negative for pain, redness and itching.  Respiratory: Positive for cough. Negative for shortness of breath and wheezing.   Cardiovascular: Negative for chest pain.  Gastrointestinal: Negative for abdominal pain, diarrhea, nausea and vomiting.  Endocrine: Negative for polyuria.  Genitourinary: Negative for dysuria, frequency and urgency.  Musculoskeletal: Negative for arthralgias and myalgias.  Allergic/Immunologic: Negative for immunocompromised  state.  Neurological: Positive for headaches. Negative for dizziness, tremors, syncope, weakness and numbness.  Hematological: Negative for adenopathy. Does not bruise/bleed easily.  Psychiatric/Behavioral: Negative for dysphoric mood. The patient is not nervous/anxious.        Objective:   Physical Exam  Constitutional: She appears well-developed and well-nourished. No distress.  Well appearing  HENT:  Head: Normocephalic and atraumatic.  Right Ear: External ear normal.  Left Ear: External ear normal.  Mouth/Throat: Oropharynx is clear and moist. No oropharyngeal exudate.  Nares are injected and congested  Bilateral maxillary sinus tenderness (worse on the R)  Post nasal drip   Eyes: Pupils are equal, round, and reactive to light. Conjunctivae and EOM are normal. Right eye exhibits no discharge. Left eye exhibits no discharge.  Neck: Normal range of motion. Neck supple.  Cardiovascular: Normal rate and regular rhythm.  Pulmonary/Chest: Effort normal and breath sounds normal. No respiratory distress. She has no wheezes. She has no rales.  Lymphadenopathy:    She has no cervical adenopathy.  Neurological: She is alert. She displays normal reflexes. No cranial nerve deficit. Coordination normal.  Skin: Skin is warm and dry. No rash noted.  Psychiatric: She has a normal mood and affect.          Assessment & Plan:   Problem List Items Addressed This Visit      Respiratory   Acute sinusitis - Primary    L maxillary and frontal augmentin 10 d  If cough with blood returns-needs cxr  Disc symptomatic care - see instructions on AVS  Update if not starting to improve in a week or if worsening        Relevant Medications   amoxicillin-clavulanate (AUGMENTIN) 875-125 MG tablet

## 2017-10-27 NOTE — Patient Instructions (Signed)
Drink fluids Take augmentin as directed Get back on flonase  mucinex dm is good for congestion and cough Warm compress is good on face Breathe steam   If cough persists with blood -we need to get a chest xray   Update if not starting to improve in a week or if worsening

## 2017-10-27 NOTE — Assessment & Plan Note (Signed)
L maxillary and frontal augmentin 10 d  If cough with blood returns-needs cxr  Disc symptomatic care - see instructions on AVS  Update if not starting to improve in a week or if worsening

## 2017-11-26 ENCOUNTER — Encounter: Payer: Self-pay | Admitting: Gastroenterology

## 2017-12-17 ENCOUNTER — Encounter: Payer: Self-pay | Admitting: Family Medicine

## 2017-12-17 ENCOUNTER — Ambulatory Visit: Payer: 59 | Admitting: Family Medicine

## 2017-12-17 VITALS — BP 132/78 | HR 74 | Temp 98.7°F | Ht 61.25 in | Wt 158.4 lb

## 2017-12-17 DIAGNOSIS — R002 Palpitations: Secondary | ICD-10-CM | POA: Diagnosis not present

## 2017-12-17 DIAGNOSIS — Z8679 Personal history of other diseases of the circulatory system: Secondary | ICD-10-CM

## 2017-12-17 DIAGNOSIS — R0789 Other chest pain: Secondary | ICD-10-CM

## 2017-12-17 DIAGNOSIS — J0100 Acute maxillary sinusitis, unspecified: Secondary | ICD-10-CM

## 2017-12-17 DIAGNOSIS — R079 Chest pain, unspecified: Secondary | ICD-10-CM | POA: Insufficient documentation

## 2017-12-17 DIAGNOSIS — I10 Essential (primary) hypertension: Secondary | ICD-10-CM | POA: Diagnosis not present

## 2017-12-17 MED ORDER — AMOXICILLIN-POT CLAVULANATE 875-125 MG PO TABS: 1.0000 | ORAL_TABLET | Freq: Two times a day (BID) | ORAL | 0 refills | Status: DC

## 2017-12-17 NOTE — Patient Instructions (Signed)
Take augmentin for sinus infection Get back on flonase  Also nasal saline if it helps  Update if not starting to improve in a week or if worsening     We will refer you to cardiology  If you develop severe chest pain/palpitations or symptoms that do not stop -go to the ED  Ibuprofen for arm/shoulder blade

## 2017-12-17 NOTE — Assessment & Plan Note (Addendum)
L maxillary this time -pressure with some congestion and cough  Some ear discomfort- many surgeries on L ear in the past  Cover with augmentin  Disc symptomatic care - see instructions on AVS  Update if not starting to improve in a week or if worsening

## 2017-12-17 NOTE — Progress Notes (Signed)
Subjective:    Patient ID: Felicia Robbins, female    DOB: 12-17-1955, 62 y.o.   MRN: [id number]  HPI Here for headache with tinnitus  L side (the side with hx of ear surgeries)   Had a sinus infection a month ago  Then had a root canal after that (R side)   A week ago just started feeling bad  Ringing in ear Feels her heartbeat/ unsure if occ skips beats  Feels like her muscles on the L side of her body fatigue more easily occ some L breast/chest and back pain   EKG today - with LBBB/ L axis and rate of 65 with short PR She has hx of LBBB and WPW in the past  No cardiology visit for a long time   Coughs/spits up phlegm - from chest  White in color  Once it comes up she stops coughing   Now some L sided sinus issues (pressure but not pain)  Just a little congested No fever    Does not feel well in general  Thought bp may be elevated   BP Readings from Last 3 Encounters:  12/17/17 132/78  10/27/17 (!) 150/86  05/17/17 (!) 142/82  went to nurse at work tues- 160/90 (when she was feeling bad)    Wt Readings from Last 3 Encounters:  12/17/17 158 lb 6.4 oz (71.8 kg)  10/27/17 152 lb 12 oz (69.3 kg)  05/17/17 160 lb (72.6 kg)   29.69 kg/m   Some stress-upgrading computer system at work   She started trying to eat better  Stopped caffeine (head hurt before that)   Does not feel like a migraine- tension type No longer goes to HA clinic   Patient Active Problem List   Diagnosis Date Noted  . Chest pain 12/17/2017  . Palpitations 12/17/2017  . Hyperlipidemia 03/17/2017  . Elevated glucose level 03/17/2017  . Acute sinusitis 11/10/2016  . Obesity 02/11/2016  . Lactose intolerance 02/11/2016  . Stress reaction, emotional 01/19/2012  . Gynecologic exam normal 12/08/2010  . Routine general medical examination at a health care facility 11/29/2010  . Hot flashes 08/11/2010  . RHINITIS 08/15/2007  . FIBROCYSTIC BREAST DISEASE 09/24/2006  . HX, PERSONAL,  COLONIC POLYPS 09/24/2006  . MIGRAINE, COMMON 08/25/2006  . DECREASED HEARING 08/25/2006  . Essential hypertension 08/25/2006  . Esophageal reflux 08/25/2006  . MIGRAINES, HX OF 08/25/2006   Past Medical History:  Diagnosis Date  . Depression   . GERD (gastroesophageal reflux disease)   . Headache(784.0)   . 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 WPW (Wolff-Parkinson-White syndrome)    See scanned history note   Past Surgical History:  Procedure Laterality Date  . BIOPSY BOWEL     small bowel biopsy- normal (colon plyps / colonoscopy 03/1999)  . BREAST BIOPSY Left 07/13/2013   Procedure: BREAST BIOPSY WITH NEEDLE LOCALIZATION;  Surgeon: Marcello Moores A. Cornett, MD;  Location: [facility];  Service: General;  Laterality: Left;  . BREAST SURGERY     Lt breast nodule - neg.  . CHOLECYSTECTOMY  2005   lap choli  . INNER EAR SURGERY  2011   Multiple surgeries Lt ear. Hearing device implanted Lt ear 2011.  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 TUBAL LIGATION     Social History   Tobacco Use  . Smoking status: Former Smoker    Last attempt to quit: 04/20/1986    Years  since quitting: 31.6  . Smokeless tobacco: Never Used  Substance Use Topics  . Alcohol use: Yes    Alcohol/week: 0.0 standard drinks    Comment: 2-3 drinks a week  . Drug use: No   Family History  Problem Relation Age of Onset  . Cancer Mother        breast  . Diabetes Mother   . Stroke Mother   . Multiple sclerosis Mother   . Crohn's disease Brother    Allergies  Allergen Reactions  . Tetracycline     REACTION: rash, passed out   Current Outpatient Medications on File Prior to Visit  Medication Sig Dispense Refill  . OTOVEL 0.3-0.025 % SOLN Place 0.25 mLs into the left ear 2 (two) times daily.     Marland Kitchen tiZANidine (ZANAFLEX) 4 MG tablet Take 1 tablet (4 mg total) by mouth at bedtime as needed. 1/2 - 1 tablet by mouth four times a day as needed for headache. 30 tablet 11   No  current facility-administered medications on file prior to visit.      Review of Systems  Constitutional: Positive for fatigue. Negative for activity change, appetite change, fever and unexpected weight change.  HENT: Positive for congestion and sinus pressure. Negative for ear pain, rhinorrhea, sinus pain, sore throat and voice change.   Eyes: Negative for pain, redness and visual disturbance.  Respiratory: Positive for cough. Negative for chest tightness, shortness of breath and wheezing.   Cardiovascular: Positive for chest pain. Negative for palpitations and leg swelling.  Gastrointestinal: Negative for abdominal pain, blood in stool, constipation and diarrhea.  Endocrine: Negative for polydipsia and polyuria.  Genitourinary: Negative for dysuria, frequency and urgency.       L breast hurts when chest does  Musculoskeletal: Negative for arthralgias, back pain and myalgias.       L upper back hurts when chest does   Skin: Negative for pallor and rash.  Allergic/Immunologic: Negative for environmental allergies.  Neurological: Positive for dizziness and headaches. Negative for tremors, seizures, syncope, speech difficulty, weakness, light-headedness and numbness.       Occ feels dizzy with a headache -mild   Hematological: Negative for adenopathy. Does not bruise/bleed easily.  Psychiatric/Behavioral: Negative for decreased concentration and dysphoric mood. The patient is not nervous/anxious.        Objective:   Physical Exam  Constitutional: She appears well-developed and well-nourished. No distress.  HENT:  Head: Normocephalic and atraumatic.  Mouth/Throat: Oropharynx is clear and moist.  Baseline deformity of L ear canal  R TM clear  Nares are injected and congested  slt pnd  Throat looks clear   Eyes: Pupils are equal, round, and reactive to light. Conjunctivae and EOM are normal. Right eye exhibits no discharge. Left eye exhibits no discharge. No scleral icterus.  Neck:  Normal range of motion. Neck supple. No JVD present. Carotid bruit is not present. No tracheal deviation present. No thyromegaly present.  Cardiovascular: Normal rate, regular rhythm, normal heart sounds and intact distal pulses. Exam reveals no gallop.  No murmur heard. Pulmonary/Chest: Effort normal and breath sounds normal. No stridor. No respiratory distress. She has no wheezes. She has no rales. She exhibits tenderness.  No crackles  Some mild CW tenderness L lateral    Abdominal: Soft. Bowel sounds are normal. She exhibits no distension, no abdominal bruit and no mass. There is no tenderness.  Genitourinary:  Genitourinary Comments: Breast exam: No mass, nodules, thickening, tenderness, bulging, retraction, inflamation, nipple discharge or  skin changes noted.  No axillary or clavicular LA.       Musculoskeletal: She exhibits no edema, tenderness or deformity.  Mild tenderness of musculature medial to L scapula  Lymphadenopathy:    She has no cervical adenopathy.  Neurological: She is alert. She has normal reflexes. She displays normal reflexes. No cranial nerve deficit. She exhibits normal muscle tone. Coordination normal.  Skin: Skin is warm and dry. No rash noted. No erythema. No pallor.  Psychiatric: She has a normal mood and affect.  Pleasant  Mood is good           Assessment & Plan:   Problem List Items Addressed This Visit      Cardiovascular and Mediastinum   Essential hypertension    bp in fair control at this time  BP Readings from Last 1 Encounters:  12/17/17 132/78   No changes needed Most recent labs reviewed  Disc lifstyle change with low sodium diet and exercise          Respiratory   Acute sinusitis    L maxillary this time -pressure with some congestion and cough  Some ear discomfort- many surgeries on L ear in the past  Cover with augmentin  Disc symptomatic care - see instructions on AVS  Update if not starting to improve in a week or if  worsening        Relevant Medications   amoxicillin-clavulanate (AUGMENTIN) 875-125 MG tablet     Other   Chest pain - Primary    Atypical with c/o of sore muscles on L side of the body  Also L breast and back pain - suspect MSK  EKG shows LBBB in setting of h/o WPW Ref to cardiology  Disc red flags for ED visit- sustained palpitations/ exertional symptoms/sob or rapid HR      Relevant Orders   EKG 12-Lead (Completed)   Ambulatory referral to Cardiology   History of Wolff-Parkinson-White (WPW) syndrome    Some palpitations LBBB on EKG  Ref to cardiology      Relevant Orders   Ambulatory referral to Cardiology   Palpitations    H/o WPW EKG - LBBB  Ref to cardiolo      Relevant Orders   EKG 12-Lead (Completed)   Ambulatory referral to Cardiology

## 2017-12-20 NOTE — Assessment & Plan Note (Signed)
Atypical with c/o of sore muscles on L side of the body  Also L breast and back pain - suspect MSK  EKG shows LBBB in setting of h/o WPW Ref to cardiology  Disc red flags for ED visit- sustained palpitations/ exertional symptoms/sob or rapid HR

## 2017-12-20 NOTE — Assessment & Plan Note (Signed)
Some palpitations LBBB on EKG  Ref to cardiology

## 2017-12-20 NOTE — Assessment & Plan Note (Signed)
H/o WPW EKG - LBBB  Ref to cardiolo

## 2017-12-20 NOTE — Assessment & Plan Note (Signed)
bp in fair control at this time  BP Readings from Last 1 Encounters:  12/17/17 132/78   No changes needed Most recent labs reviewed  Disc lifstyle change with low sodium diet and exercise

## 2017-12-21 ENCOUNTER — Telehealth: Payer: Self-pay | Admitting: *Deleted

## 2017-12-21 NOTE — Telephone Encounter (Signed)
Pt returning call.  States she is feeling "A lot better." States "Just a few palpitations.  Feel much better overall."  Reports she has made an appt with cardiology.

## 2017-12-21 NOTE — Telephone Encounter (Signed)
-----   Message from Abner Greenspan, MD sent at 12/20/2017 10:38 AM EDT ----- Please call and check in re: how she is feeling Thanks

## 2017-12-21 NOTE — Telephone Encounter (Signed)
Good. Thanks for the update.  

## 2017-12-21 NOTE — Telephone Encounter (Signed)
Called pt and no answer so left VM requesting pt to call back and update [place] on how she's feeling, CRM created

## 2017-12-22 NOTE — Telephone Encounter (Signed)
See other phone note-Hayde Kilgour Estell Harpin, RMA

## 2018-01-08 DIAGNOSIS — I447 Left bundle-branch block, unspecified: Secondary | ICD-10-CM | POA: Insufficient documentation

## 2018-01-08 NOTE — Progress Notes (Signed)
Cardiology Office Note  Date:  01/10/2018   ID:  Felicia Robbins, DOB February 13, 1956, MRN [id number]  PCP:  Abner Greenspan, MD   Chief Complaint  Patient presents with  . other    Ref by Dr. Glori Bickers for chest pain, palpiations and a history of WPW syndrome. Meds reviewed by the pt. verbally. Pt. c/o dizziness, palpitations and shortness of breath.     HPI:  [name] is a 62 year old woman with past medical history of Former smoker, quit 30 years, smoked for 15 yrs HTN Parotid gland tumor, resection Referred by Dr. Glori Bickers for consultation of her chest pain. Palpitations, LBBB, hx of WPW  She reports having remote episodes of dizziness 1995: dizzy, gait problems,  Saw cardiology: told she had WPW Tumor on parotid , likely resected.   Tumor was likely contributing to her balance problems not the cardiac issues per the patient  Recently seen by primary care Felt that the left side of her body was having problems including pain in her face head left breast left chest.  Back discomfort sore muscles on L side of the body  EKG showed LBBB , QRS 170 Given antibiotics and symptoms got better  Review of previous EKG showed EKG in 2015:  Borderline LBBB  Lab work reviewed with her   total chol 237, LDL 137  Also reports having Rare palpitations, single ectopy Better after a minute This happens rarely  Continues to have some SOB, some strange feelings in the chest, at rest  EKG personally reviewed by myself on todays visit Shows normal sinus rhythm with rate 81 bpm intraventricular conduction delay, borderline left bundle branch block left anterior fascicular block  Orthostatics done in the office today were negative Blood pressure actually went up with standing   PMH:   has a past medical history of Depression, GERD (gastroesophageal reflux disease), Headache(784.0), HOH (hard of hearing), and WPW (Wolff-Parkinson-White syndrome).  PSH:    Past Surgical History:   Procedure Laterality Date  . BIOPSY BOWEL     small bowel biopsy- normal (colon plyps / colonoscopy 03/1999)  . BREAST BIOPSY Left 07/13/2013   Procedure: BREAST BIOPSY WITH NEEDLE LOCALIZATION;  Surgeon: Marcello Moores A. Cornett, MD;  Location: [place];  Service: General;  Laterality: Left;  . BREAST SURGERY     Lt breast nodule - neg.  . CHOLECYSTECTOMY  2005   lap choli  . INNER EAR SURGERY  2011   Multiple surgeries Lt ear. Hearing device implanted Lt ear 2011.  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 TUBAL LIGATION      Current Outpatient Medications  Medication Sig Dispense Refill  . tiZANidine (ZANAFLEX) 4 MG tablet Take 1 tablet (4 mg total) by mouth at bedtime as needed. 1/2 - 1 tablet by mouth four times a day as needed for headache. 30 tablet 11   No current facility-administered medications for this visit.     Allergies:   Tetracycline   Social History:  The patient  reports that she quit smoking about 31 years ago. Her smoking use included cigarettes. She has a 15.00 pack-year smoking history. She has never used smokeless tobacco. She reports that she drinks alcohol. She reports that she does not use drugs.   Family History:   family history includes Cancer in her mother; Crohn's disease in her brother; Diabetes in her mother; Multiple sclerosis in her mother; Stroke in her mother.    Review of  Systems: Review of Systems  Constitutional: Negative.   Respiratory: Negative.   Cardiovascular: Positive for chest pain and palpitations.  Gastrointestinal: Negative.   Musculoskeletal: Negative.   Neurological: Negative.   Psychiatric/Behavioral: Negative.   All other systems reviewed and are negative.   PHYSICAL EXAM: VS:  BP 140/70 (BP Location: Right Arm, Patient Position: Sitting, Cuff Size: Normal)   Pulse 81   Ht 5\' 1"  (1.549 m)   Wt 162 lb (73.5 kg)   LMP 11/07/2010   BMI 30.61 kg/m  , BMI Body mass index is 30.61 kg/m. GEN: Well  nourished, well developed, in no acute distress  HEENT: normal  Neck: no JVD, carotid bruits, or masses Cardiac: RRR; no murmurs, rubs, or gallops,no edema  Respiratory:  clear to auscultation bilaterally, normal work of breathing GI: soft, nontender, nondistended, + BS MS: no deformity or atrophy  Skin: warm and dry, no rash Neuro:  Strength and sensation are intact Psych: euthymic mood, full affect   Recent Labs: 02/04/2017: ALT 17; BUN 14; Creatinine, Ser 0.71; Hemoglobin 13.5; Platelets 271.0; Potassium 4.4; Sodium 141; TSH 0.67    Lipid Panel Lab Results  Component Value Date   CHOL 237 (H) 02/04/2017   HDL 76.80 02/04/2017   LDLCALC 137 (H) 02/04/2017   TRIG 116.0 02/04/2017      Wt Readings from Last 3 Encounters:  01/10/18 162 lb (73.5 kg)  12/17/17 158 lb 6.4 oz (71.8 kg)  10/27/17 152 lb 12 oz (69.3 kg)    Hyperlipidemia ASSESSMENT AND PLAN:  History of Wolff-Parkinson-White (WPW) syndrome -  Prior EKG 2015 showing interventricular conduction delay Less likely WPW No further work-up at this time No significant symptoms   LBBB (left bundle branch block) - Plan: EKG 12-Lead, CT CARDIAC SCORING Intermittent left bundle branch block Long discussion with her concerning work-up Denies any symptoms concerning for angina Risk factors include hyperlipidemia and prior smoking history After discussion of various types of stress testing, She prefers CT coronary calcium scoring Low score would be encouraging and may not want further work-up  Other chest pain - Plan: CT CARDIAC SCORING Atypical in nature CT calcium scoring as above for risk stratification  Palpitations Likely having ectopy APCs or PVCs These were appreciated on exam today but not on EKG If symptoms get worse recommend she call our office for a event monitor  Mixed hyperlipidemia Discussed hyperlipidemia with her We will use calcium scoring as a guide for further management  Disposition:   F/U  as needed  Patient was seen in consultation for Dr. Glori Bickers and will be referred back to her office for ongoing care of the issues detailed above   Total encounter time more than 60 minutes  Greater than 50% was spent in counseling and coordination of care with the patient      Orders Placed This Encounter  Procedures  . CT CARDIAC SCORING  . EKG 12-Lead     Signed, Esmond Plants, M.D., Ph.D. 01/10/2018  [place], [place]

## 2018-01-10 ENCOUNTER — Ambulatory Visit: Payer: 59 | Admitting: Cardiovascular Disease

## 2018-01-10 ENCOUNTER — Encounter: Payer: Self-pay | Admitting: Cardiovascular Disease

## 2018-01-10 VITALS — BP 140/70 | HR 81 | Ht 61.0 in | Wt 162.0 lb

## 2018-01-10 DIAGNOSIS — Z8679 Personal history of other diseases of the circulatory system: Secondary | ICD-10-CM

## 2018-01-10 DIAGNOSIS — R0789 Other chest pain: Secondary | ICD-10-CM | POA: Diagnosis not present

## 2018-01-10 DIAGNOSIS — E782 Mixed hyperlipidemia: Secondary | ICD-10-CM

## 2018-01-10 DIAGNOSIS — R002 Palpitations: Secondary | ICD-10-CM

## 2018-01-10 DIAGNOSIS — I447 Left bundle-branch block, unspecified: Secondary | ICD-10-CM

## 2018-01-10 DIAGNOSIS — I10 Essential (primary) hypertension: Secondary | ICD-10-CM

## 2018-01-10 NOTE — Patient Instructions (Signed)
Medication Instructions:   No medication changes made  Labwork:  No new labs needed  Testing/Procedures:  We will order a CT coronary calcium for chest pain $150  Call if you would like a Zio Patch (monitor) If sx come back    Follow-Up: It was a pleasure seeing you in the office today. Please call us if you have new issues that need to be addressed before your next appt.  [phone]  Your physician wants you to follow-up in:  As needed  If you need a refill on your cardiac medications before your next appointment, please call your pharmacy.  For educational health videos Log in to : [email or web address] Or : [email or web address], password : [email or web address]

## 2018-01-26 ENCOUNTER — Inpatient Hospital Stay: Admission: RE | Admit: 2018-01-26 | Payer: 59 | Source: Ambulatory Visit

## 2018-02-09 ENCOUNTER — Telehealth: Payer: Self-pay | Admitting: *Deleted

## 2018-02-09 ENCOUNTER — Ambulatory Visit (INDEPENDENT_AMBULATORY_CARE_PROVIDER_SITE_OTHER)
Admission: RE | Admit: 2018-02-09 | Discharge: 2018-02-09 | Disposition: A | Discharge status: Home / Self Care | Payer: 59 | Source: Ambulatory Visit | Attending: Cardiovascular Disease | Admitting: Cardiovascular Disease

## 2018-02-09 DIAGNOSIS — I447 Left bundle-branch block, unspecified: Secondary | ICD-10-CM

## 2018-02-09 DIAGNOSIS — R0789 Other chest pain: Secondary | ICD-10-CM

## 2018-02-09 NOTE — Telephone Encounter (Signed)
[facility] Radiology called to give the results of the patient's cardiac score. Provider has been made aware that they are available on epic.

## 2018-02-10 ENCOUNTER — Telehealth: Payer: Self-pay | Admitting: *Deleted

## 2018-02-10 NOTE — Telephone Encounter (Signed)
Results called to pt. Pt verbalized understanding of results. She would like to not start on the cholesterol medication at this time. She would also elect to follow up with her PCP regarding the repeat CT in 3-6 months when she goes for her annual check up. She will mark her calendar to discuss this with Dr Glori Bickers at this time.

## 2018-02-10 NOTE — Telephone Encounter (Signed)
-----   Message from Felicia Merritts, MD sent at 02/09/2018 10:26 PM EDT ----- CT coronary calcium score Score is 59 which is very low, discomfort she felt likely noncardiac But with that said, score of 59  does place her in the 83th percentile based on her age. Does she want a cholesterol medication like crestor 5 mg daily THere is also a lung nodule, very small. Radiology recommended repeat CT in 3-6 months to reevaluate We can order CT chest , no contrast, in 3-6 months if she would like Will cc Dr. Glori Bickers

## 2018-03-02 LAB — HM MAMMOGRAPHY: HM Mammogram: NORMAL (ref 0–4)

## 2018-03-04 ENCOUNTER — Encounter: Payer: Self-pay | Admitting: Family Medicine

## 2018-03-04 ENCOUNTER — Ambulatory Visit: Payer: 59 | Admitting: Family Medicine

## 2018-03-04 DIAGNOSIS — J069 Acute upper respiratory infection, unspecified: Secondary | ICD-10-CM

## 2018-03-04 LAB — HM PAP SMEAR: HM Pap smear: NORMAL

## 2018-03-04 MED ORDER — AMOXICILLIN-POT CLAVULANATE 875-125 MG PO TABS: 1.0000 | ORAL_TABLET | Freq: Two times a day (BID) | ORAL | 0 refills | Status: DC

## 2018-03-04 NOTE — Patient Instructions (Signed)
Use flonase and nasal saline for now.  Rest and fluid.   If symptoms continue, then start augmentin.  Update [place] as needed, especially if the cough continues.  Take care.  Glad to see you.

## 2018-03-04 NOTE — Progress Notes (Signed)
Sx started yesterday with ST.  Noted red throat initially.  Then L ear pain.  She felt hot but didn't check temp.  No R ear pain.  L frontal and max HA.  No vomiting, some loose stools.  Some cough, some sputum.  Sputum discolored in the AM today.  She has had a cough for the last few months, mainly in the AMs.  She has f/u pending re: pulmonary nodules.    She has L sided tooth pain recently.    Granddaughter was recently sick with PNA.  She is doing better in the meantime.   Meds, vitals, and allergies reviewed.   ROS: Per HPI unless specifically indicated in ROS section   GEN: nad, alert and oriented HEENT: mucous membranes moist, nasal exam w/o erythema, clear discharge noted,  OP with cobblestoning L TM perf at baseline.  L baja in place.   R TM wnl with chronic old changes noted.    L max sinus ttp.   NECK: supple w/o LA CV: rrr.   PULM: ctab, no inc wob EXT: no edema SKIN: well perfused.

## 2018-03-06 DIAGNOSIS — J069 Acute upper respiratory infection, unspecified: Secondary | ICD-10-CM | POA: Insufficient documentation

## 2018-03-06 NOTE — Assessment & Plan Note (Signed)
Nontoxic.  Okay for outpatient follow-up.  Discussed options.  This could be viral and if so it should resolve with supportive care.  If worsening in the meantime or prolonged symptoms then it would make sense to start antibiotics given her history. Use flonase and nasal saline for now.  Rest and fluid.   If symptoms continue, then start augmentin.  Update [place] as needed, especially if the cough continues.  She agrees with plan.

## 2018-03-08 ENCOUNTER — Telehealth: Payer: Self-pay | Admitting: Family Medicine

## 2018-03-08 DIAGNOSIS — I10 Essential (primary) hypertension: Secondary | ICD-10-CM

## 2018-03-08 DIAGNOSIS — R7309 Other abnormal glucose: Secondary | ICD-10-CM

## 2018-03-08 DIAGNOSIS — E782 Mixed hyperlipidemia: Secondary | ICD-10-CM

## 2018-03-08 NOTE — Telephone Encounter (Signed)
-----   Message from Ellamae Sia sent at 02/28/2018  2:58 PM EST ----- Regarding: Lab orders for Wednesday, 11.20.19 Patient is scheduled for CPX labs, please order future labs, Thanks , Karna Christmas

## 2018-03-09 ENCOUNTER — Other Ambulatory Visit (INDEPENDENT_AMBULATORY_CARE_PROVIDER_SITE_OTHER): Payer: 59

## 2018-03-09 DIAGNOSIS — E782 Mixed hyperlipidemia: Secondary | ICD-10-CM | POA: Diagnosis not present

## 2018-03-09 DIAGNOSIS — R7309 Other abnormal glucose: Secondary | ICD-10-CM | POA: Diagnosis not present

## 2018-03-09 DIAGNOSIS — I10 Essential (primary) hypertension: Secondary | ICD-10-CM | POA: Diagnosis not present

## 2018-03-09 LAB — CBC WITH DIFFERENTIAL/PLATELET
Basophils Absolute: 0.1 10*3/uL (ref 0.0–0.1)
Basophils Relative: 0.7 % (ref 0.0–3.0)
EOS ABS: 0.2 10*3/uL (ref 0.0–0.7)
Eosinophils Relative: 2 % (ref 0.0–5.0)
HCT: 39.4 % (ref 36.0–46.0)
Hemoglobin: 13.1 g/dL (ref 12.0–15.0)
LYMPHS ABS: 2.2 10*3/uL (ref 0.7–4.0)
Lymphocytes Relative: 23.2 % (ref 12.0–46.0)
MCHC: 33.3 g/dL (ref 30.0–36.0)
MCV: 92.7 fl (ref 78.0–100.0)
MONO ABS: 0.8 10*3/uL (ref 0.1–1.0)
Monocytes Relative: 8.5 % (ref 3.0–12.0)
NEUTROS ABS: 6.3 10*3/uL (ref 1.4–7.7)
NEUTROS PCT: 65.6 % (ref 43.0–77.0)
PLATELETS: 252 10*3/uL (ref 150.0–400.0)
RBC: 4.25 Mil/uL (ref 3.87–5.11)
RDW: 13.6 % (ref 11.5–15.5)
WBC: 9.6 10*3/uL (ref 4.0–10.5)

## 2018-03-09 LAB — TSH: TSH: 0.84 u[IU]/mL (ref 0.35–4.50)

## 2018-03-09 LAB — LIPID PANEL
CHOLESTEROL: 192 mg/dL (ref 0–200)
HDL: 76.8 mg/dL (ref >39.00)
LDL Cholesterol: 100 mg/dL — ABNORMAL HIGH (ref 0–99)
NONHDL: 115.48
Total CHOL/HDL Ratio: 3
Triglycerides: 77 mg/dL (ref 0.0–149.0)
VLDL: 15.4 mg/dL (ref 0.0–40.0)

## 2018-03-09 LAB — COMPREHENSIVE METABOLIC PANEL
ALK PHOS: 86 U/L (ref 39–117)
ALT: 11 U/L (ref 0–35)
AST: 16 U/L (ref 0–37)
Albumin: 4.2 g/dL (ref 3.5–5.2)
BUN: 15 mg/dL (ref 6–23)
CO2: 28 meq/L (ref 19–32)
CREATININE: 0.7 mg/dL (ref 0.40–1.20)
Calcium: 9.3 mg/dL (ref 8.4–10.5)
Chloride: 103 mEq/L (ref 96–112)
GFR: 90.3 mL/min (ref >60.00)
Glucose, Bld: 104 mg/dL — ABNORMAL HIGH (ref 70–99)
Potassium: 4.2 mEq/L (ref 3.5–5.1)
SODIUM: 139 meq/L (ref 135–145)
TOTAL PROTEIN: 7.2 g/dL (ref 6.0–8.3)
Total Bilirubin: 0.6 mg/dL (ref 0.2–1.2)

## 2018-03-09 LAB — HEMOGLOBIN A1C: HEMOGLOBIN A1C: 5.5 % (ref 4.6–6.5)

## 2018-03-16 ENCOUNTER — Other Ambulatory Visit: Payer: 59

## 2018-03-21 ENCOUNTER — Ambulatory Visit (INDEPENDENT_AMBULATORY_CARE_PROVIDER_SITE_OTHER): Payer: 59 | Admitting: Family Medicine

## 2018-03-21 ENCOUNTER — Encounter: Payer: Self-pay | Admitting: Family Medicine

## 2018-03-21 VITALS — BP 130/74 | HR 72 | Temp 98.4°F | Ht 61.0 in | Wt 166.2 lb

## 2018-03-21 DIAGNOSIS — E782 Mixed hyperlipidemia: Secondary | ICD-10-CM | POA: Diagnosis not present

## 2018-03-21 DIAGNOSIS — I10 Essential (primary) hypertension: Secondary | ICD-10-CM | POA: Diagnosis not present

## 2018-03-21 DIAGNOSIS — Z6831 Body mass index (BMI) 31.0-31.9, adult: Secondary | ICD-10-CM

## 2018-03-21 DIAGNOSIS — R7309 Other abnormal glucose: Secondary | ICD-10-CM

## 2018-03-21 DIAGNOSIS — E6609 Other obesity due to excess calories: Secondary | ICD-10-CM

## 2018-03-21 DIAGNOSIS — Z0000 Encounter for general adult medical examination without abnormal findings: Secondary | ICD-10-CM

## 2018-03-21 DIAGNOSIS — Z1211 Encounter for screening for malignant neoplasm of colon: Secondary | ICD-10-CM

## 2018-03-21 DIAGNOSIS — R918 Other nonspecific abnormal finding of lung field: Secondary | ICD-10-CM

## 2018-03-21 DIAGNOSIS — M8589 Other specified disorders of bone density and structure, multiple sites: Secondary | ICD-10-CM

## 2018-03-21 DIAGNOSIS — M8580 Other specified disorders of bone density and structure, unspecified site: Secondary | ICD-10-CM | POA: Insufficient documentation

## 2018-03-21 MED ORDER — TIZANIDINE HCL 4 MG PO TABS: 4.0000 mg | ORAL_TABLET | Freq: Every evening | ORAL | 11 refills | Status: DC | PRN

## 2018-03-21 NOTE — Assessment & Plan Note (Signed)
Per dexa 11/19 at gyn  Sent for report   Disc need for calcium/ vitamin D/ wt bearing exercise and bone density test every 2 y to monitor Disc safety/ fracture risk in detail

## 2018-03-21 NOTE — Assessment & Plan Note (Signed)
Seen incidentally on CT scan for a calcium score  Former smoker/remotely  Will need re check between end of jan to end of April = she will call when ready for [place] to schedule it

## 2018-03-21 NOTE — Assessment & Plan Note (Signed)
bp in fair control at this time  BP Readings from Last 1 Encounters:  03/21/18 130/74   No changes needed Most recent labs reviewed  Disc lifstyle change with low sodium diet and exercise

## 2018-03-21 NOTE — Assessment & Plan Note (Signed)
Due for 10 y screening  Ref done to GI

## 2018-03-21 NOTE — Progress Notes (Signed)
Subjective:    Patient ID: Felicia Robbins, female    DOB: 06/13/1955, 62 y.o.   MRN: [id number]  HPI Here for health maintenance exam and to review chronic medical problems    Feeling good  Taking care of herself   Wt Readings from Last 3 Encounters:  03/21/18 166 lb 4 oz (75.4 kg)  03/04/18 163 lb (73.9 kg)  01/10/18 162 lb (73.5 kg)  wt is up 3 lb  Got a new apple watch to help track her activity  31.41 kg/m   Mammogram - had it - last mo at gyn / was fine  Self breast exam -no change  Mother had breast cancer   Had dexa 11/19  Has osteopenia  No falls or fractures  Needs to be better about vit D and Ca    Colonoscopy 9/09  10 y recall - needs to set up   Pap 11/19 at gyn -nl result (will send for)   Tetanus shot 2/14  Flu shot 9/19   zostavax 8/12  Had uri/seen by Dr Damita Dunnings 11/19  Better now  Never needed to fill augmentin   bp is stable today  No cp or palpitations or headaches or edema  No side effects to medicines  BP Readings from Last 3 Encounters:  03/21/18 130/74  03/04/18 136/82  01/10/18 140/70      H/o elevated glucose Lab Results  Component Value Date   HGBA1C 5.5 03/09/2018  not much exercise (she walks when she can) Diet is fair     Hyperlipidemia  Lab Results  Component Value Date   CHOL 192 03/09/2018   CHOL 237 (H) 02/04/2017   CHOL 187 02/06/2016   Lab Results  Component Value Date   HDL 76.80 03/09/2018   HDL 76.80 02/04/2017   HDL 65.50 02/06/2016   Lab Results  Component Value Date   LDLCALC 100 (H) 03/09/2018   LDLCALC 137 (H) 02/04/2017   LDLCALC 105 (H) 02/06/2016   Lab Results  Component Value Date   TRIG 77.0 03/09/2018   TRIG 116.0 02/04/2017   TRIG 81.0 02/06/2016   Lab Results  Component Value Date   CHOLHDL 3 03/09/2018   CHOLHDL 3 02/04/2017   CHOLHDL 3 02/06/2016   Lab Results  Component Value Date   LDLDIRECT 121.7 12/27/2012   LDLDIRECT 116.4 01/12/2012   LDLDIRECT 122.6 12/02/2010     LDL is down  HDL is very good  Eating low sat/trans fat  Had a calcium score in 83%ile -saw cardiology   Lab Results  Component Value Date   CREATININE 0.70 03/09/2018   BUN 15 03/09/2018   NA 139 03/09/2018   K 4.2 03/09/2018   CL 103 03/09/2018   CO2 28 03/09/2018   Lab Results  Component Value Date   ALT 11 03/09/2018   AST 16 03/09/2018   ALKPHOS 86 03/09/2018   BILITOT 0.6 03/09/2018   Lab Results  Component Value Date   WBC 9.6 03/09/2018   HGB 13.1 03/09/2018   HCT 39.4 03/09/2018   MCV 92.7 03/09/2018   PLT 252.0 03/09/2018    Lab Results  Component Value Date   TSH 0.84 03/09/2018    Patient Active Problem List   Diagnosis Date Noted  . Colon cancer screening 03/21/2018  . Pulmonary nodules 03/21/2018  . LBBB (left bundle branch block) 01/08/2018  . Chest pain 12/17/2017  . Palpitations 12/17/2017  . History of Wolff-Parkinson-White (WPW) syndrome 12/17/2017  . Hyperlipidemia 03/17/2017  .  Elevated glucose level 03/17/2017  . Obesity 02/11/2016  . Lactose intolerance 02/11/2016  . Stress reaction, emotional 01/19/2012  . Gynecologic exam normal 12/08/2010  . Routine general medical examination at a health care facility 11/29/2010  . Hot flashes 08/11/2010  . RHINITIS 08/15/2007  . FIBROCYSTIC BREAST DISEASE 09/24/2006  . HX, PERSONAL, COLONIC POLYPS 09/24/2006  . MIGRAINE, COMMON 08/25/2006  . DECREASED HEARING 08/25/2006  . Essential hypertension 08/25/2006  . Esophageal reflux 08/25/2006  . MIGRAINES, HX OF 08/25/2006   Past Medical History:  Diagnosis Date  . Depression   . GERD (gastroesophageal reflux disease)   . Headache(784.0)   . 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 WPW (Wolff-Parkinson-White syndrome)    See scanned history note   Past Surgical History:  Procedure Laterality Date  . BIOPSY BOWEL     small bowel biopsy- normal (colon plyps / colonoscopy 03/1999)  . BREAST BIOPSY Left 07/13/2013   Procedure: BREAST  BIOPSY WITH NEEDLE LOCALIZATION;  Surgeon: Marcello Moores A. Cornett, MD;  Location: [place];  Service: General;  Laterality: Left;  . BREAST SURGERY     Lt breast nodule - neg.  . CHOLECYSTECTOMY  2005   lap choli  . INNER EAR SURGERY  2011   Multiple surgeries Lt ear. Hearing device implanted Lt ear 2011.  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 TUBAL LIGATION     Social History   Tobacco Use  . Smoking status: Former Smoker    Packs/day: 1.00    Years: 15.00    Pack years: 15.00    Types: Cigarettes    Last attempt to quit: 04/20/1986    Years since quitting: 31.9  . Smokeless tobacco: Never Used  Substance Use Topics  . Alcohol use: Yes    Alcohol/week: 0.0 standard drinks    Comment: 2-3 drinks a week  . Drug use: No   Family History  Problem Relation Age of Onset  . Cancer Mother        breast  . Diabetes Mother   . Stroke Mother   . Multiple sclerosis Mother   . Crohn's disease Brother    Allergies  Allergen Reactions  . Tetracycline Other (See Comments)    REACTION: rash, passed out REACTION:  passed out   No current outpatient medications on file prior to visit.   No current facility-administered medications on file prior to visit.      Review of Systems  Constitutional: Negative for activity change, appetite change, fatigue, fever and unexpected weight change.  HENT: Positive for hearing loss. Negative for congestion, ear pain, rhinorrhea, sinus pressure and sore throat.        Recent uri is better   Baseline hearing impaired   Eyes: Negative for pain, redness and visual disturbance.  Respiratory: Negative for cough, shortness of breath and wheezing.   Cardiovascular: Negative for chest pain and palpitations.  Gastrointestinal: Negative for abdominal pain, blood in stool, constipation and diarrhea.  Endocrine: Negative for polydipsia and polyuria.  Genitourinary: Negative for dysuria, frequency and urgency.  Musculoskeletal:  Negative for arthralgias, back pain and myalgias.  Skin: Negative for pallor and rash.  Allergic/Immunologic: Negative for environmental allergies.  Neurological: Negative for dizziness, syncope and headaches.  Hematological: Negative for adenopathy. Does not bruise/bleed easily.  Psychiatric/Behavioral: Negative for decreased concentration and dysphoric mood. The patient is not nervous/anxious.        Objective:   Physical Exam  Constitutional: She appears well-developed and well-nourished. No distress.  obese and well appearing   HENT:  Head: Normocephalic and atraumatic.  Right Ear: External ear normal.  Left Ear: External ear normal.  Mouth/Throat: Oropharynx is clear and moist.  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 Conjunctivae and EOM are normal. No scleral icterus.  Neck: Normal range of motion. Neck supple. No JVD present. Carotid bruit is not present. No thyromegaly present.  Cardiovascular: Normal rate, regular rhythm, normal heart sounds and intact distal pulses. Exam reveals no gallop.  Pulmonary/Chest: Effort normal and breath sounds normal. No respiratory distress. She has no wheezes. She has no rales. She exhibits no tenderness. No breast tenderness, discharge or bleeding.  Abdominal: Soft. Bowel sounds are normal. She exhibits no distension, no abdominal bruit and no mass. There is no tenderness.  Genitourinary: No breast tenderness, discharge or bleeding.  Genitourinary Comments: Breast exam: No mass, nodules, thickening, tenderness, bulging, retraction, inflamation, nipple discharge or skin changes noted.  No axillary or clavicular LA.      Musculoskeletal: Normal range of motion. She exhibits no edema or tenderness.  Lymphadenopathy:    She has no cervical adenopathy.  Neurological: She is alert. She has normal reflexes. She displays normal reflexes. No cranial nerve deficit. She exhibits normal muscle  tone. Coordination normal.  Skin: Skin is warm and dry. No rash noted. No erythema. No pallor.  Solar lentigines diffusely   Psychiatric: She has a normal mood and affect.  Pleasant           Assessment & Plan:   Problem List Items Addressed This Visit      Cardiovascular and Mediastinum   Essential hypertension    bp in fair control at this time  BP Readings from Last 1 Encounters:  03/21/18 130/74   No changes needed Most recent labs reviewed  Disc lifstyle change with low sodium diet and exercise          Musculoskeletal and Integument   Osteopenia    Per dexa 11/19 at gyn  Sent for report   Disc need for calcium/ vitamin D/ wt bearing exercise and bone density test every 2 y to monitor Disc safety/ fracture risk in detail          Other   Routine general medical examination at a health care facility - Primary    Reviewed health habits including diet and exercise and skin cancer prevention Reviewed appropriate screening tests for age  Also reviewed health mt list, fam hx and immunization status , as well as social and family history   See HPI Labs rev  Disc osteopenia- enc her to take Ca and D  Sent for pap/mammo/dexa report from gyn  Disc healthy diet and exercise to control cholesterol and blood sugar       Pulmonary nodules    Seen incidentally on CT scan for a calcium score  Former smoker/remotely  Will need re check between end of jan to end of April = she will call when ready for [place] to schedule it       Obesity    Discussed how this problem influences overall health and the risks it imposes  Reviewed plan for weight loss with lower calorie diet (via better food choices and also portion control or program like weight watchers) and exercise building up to or more than 30 minutes 5 days per week including some aerobic activity  Hyperlipidemia    Disc goals for lipids and reasons to control them Rev last labs with pt Rev low sat fat diet  in detail LDL is down from 137 to 100- that is reassuring HDL in 70s - excellent       Elevated glucose level    Lab Results  Component Value Date   HGBA1C 5.5 03/09/2018   disc imp of low glycemic diet and wt loss to prevent DM2       Colon cancer screening    Due for 10 y screening  Ref done to GI      Relevant Orders   Ambulatory referral to Gastroenterology

## 2018-03-21 NOTE — Assessment & Plan Note (Signed)
Discussed how this problem influences overall health and the risks it imposes  Reviewed plan for weight loss with lower calorie diet (via better food choices and also portion control or program like weight watchers) and exercise building up to or more than 30 minutes 5 days per week including some aerobic activity    

## 2018-03-21 NOTE — Assessment & Plan Note (Signed)
Lab Results  Component Value Date   HGBA1C 5.5 03/09/2018   disc imp of low glycemic diet and wt loss to prevent DM2

## 2018-03-21 NOTE — Patient Instructions (Addendum)
Try to get 1200-1500 mg of calcium per day with at least 1000 iu of vitamin D - for bone health  To prevent diabetes Try to get most of your carbohydrates from produce (with the exception of white potatoes)  Eat less bread/pasta/rice/snack foods/cereals/sweets and other items from the middle of the grocery store (processed carbs)   Try to fit in exercise (optimally 5 days per week 30 minutes extra)  Think about exercise videos   You will need a repeat CT of the chest between end of Jan to end of April  Call us when you are ready to schedule   We will refer you for a screening colonoscopy

## 2018-03-21 NOTE — Assessment & Plan Note (Signed)
Disc goals for lipids and reasons to control them Rev last labs with pt Rev low sat fat diet in detail LDL is down from 137 to 100- that is reassuring HDL in 70s - excellent

## 2018-03-21 NOTE — Assessment & Plan Note (Signed)
Reviewed health habits including diet and exercise and skin cancer prevention Reviewed appropriate screening tests for age  Also reviewed health mt list, fam hx and immunization status , as well as social and family history   See HPI Labs rev  Disc osteopenia- enc her to take Ca and D  Sent for pap/mammo/dexa report from gyn  Disc healthy diet and exercise to control cholesterol and blood sugar

## 2018-03-22 ENCOUNTER — Telehealth: Payer: Self-pay

## 2018-03-22 MED ORDER — TIZANIDINE HCL 4 MG PO TABS: 2.0000 mg | ORAL_TABLET | Freq: Four times a day (QID) | ORAL | 11 refills | Status: DC | PRN

## 2018-03-22 NOTE — Telephone Encounter (Signed)
Felicia Robbins at [facility] left v/m requesting cb with clarification of which set of directions to use for tizanidine that was sent to [facility] on 03/21/18.Please advise.

## 2018-03-22 NOTE — Telephone Encounter (Signed)
Resent

## 2018-06-27 ENCOUNTER — Ambulatory Visit: Payer: 59 | Admitting: Family Medicine

## 2018-06-27 ENCOUNTER — Ambulatory Visit (INDEPENDENT_AMBULATORY_CARE_PROVIDER_SITE_OTHER)
Admission: RE | Admit: 2018-06-27 | Discharge: 2018-06-27 | Disposition: A | Discharge status: Home / Self Care | Payer: 59 | Source: Ambulatory Visit | Attending: Family Medicine | Admitting: Family Medicine

## 2018-06-27 ENCOUNTER — Encounter: Payer: Self-pay | Admitting: Family Medicine

## 2018-06-27 VITALS — BP 132/74 | HR 75 | Temp 98.2°F | Ht 61.0 in | Wt 162.4 lb

## 2018-06-27 DIAGNOSIS — N3000 Acute cystitis without hematuria: Secondary | ICD-10-CM

## 2018-06-27 DIAGNOSIS — K5900 Constipation, unspecified: Secondary | ICD-10-CM

## 2018-06-27 DIAGNOSIS — R109 Unspecified abdominal pain: Secondary | ICD-10-CM | POA: Diagnosis not present

## 2018-06-27 DIAGNOSIS — N390 Urinary tract infection, site not specified: Secondary | ICD-10-CM | POA: Insufficient documentation

## 2018-06-27 LAB — POC URINALSYSI DIPSTICK (AUTOMATED)
BILIRUBIN UA: NEGATIVE
Glucose, UA: NEGATIVE
KETONES UA: NEGATIVE
NITRITE UA: NEGATIVE
Protein, UA: NEGATIVE
Spec Grav, UA: 1.02 (ref 1.010–1.025)
Urobilinogen, UA: 0.2 E.U./dL
pH, UA: 6 (ref 5.0–8.0)

## 2018-06-27 MED ORDER — SULFAMETHOXAZOLE-TRIMETHOPRIM 800-160 MG PO TABS: 1.0000 | ORAL_TABLET | Freq: Two times a day (BID) | ORAL | 0 refills | Status: DC

## 2018-06-27 NOTE — Assessment & Plan Note (Signed)
In pt with h/o IBS (more commonly diarrhea)  Recommend miralax 1-2 times daily until bm  abd xray today as well

## 2018-06-27 NOTE — Assessment & Plan Note (Signed)
Pos ua with side pain / some frequency cx sent tx with bactrim ds for 5 d inst to alert if sympt worsen before cx returns

## 2018-06-27 NOTE — Assessment & Plan Note (Signed)
In the setting of pos ua and also recent constipation  abd xray today looking for evid of kidney stone and stool burden Will try miralax Also start bactrim ds

## 2018-06-27 NOTE — Progress Notes (Signed)
Subjective:    Patient ID: Felicia Robbins, female    DOB: [birth year], 63 y.o.   MRN: [id number]  HPI Here for L side pain for 4 days   Wt Readings from Last 3 Encounters:  06/27/18 162 lb 7 oz (73.7 kg)  03/21/18 166 lb 4 oz (75.4 kg)  03/04/18 163 lb (73.9 kg)   30.69 kg/m   Having pain in L abdomen  About a month ago noticed a lump in that area under the skin  Sharp pain - on and off  Nothing makes it worse or better  Has had a bit more gas lately   No flank pain  No hx of renal stones   Past day - a little inflamed around urinary area No burning Frequency- was drinking more water  No fever No nausea  IBS h/o- more constipation lately   Drinks water  She does eat a lot of fiber  / fruit/veggies   Results for orders placed or performed in visit on 06/27/18  POCT Urinalysis Dipstick (Automated)  Result Value Ref Range   Color, UA Yellow    Clarity, UA Clear    Glucose, UA Negative Negative   Bilirubin, UA Negative    Ketones, UA Negative    Spec Grav, UA 1.020 1.010 - 1.025   Blood, UA 25 Ery/uL    pH, UA 6.0 5.0 - 8.0   Protein, UA Negative Negative   Urobilinogen, UA 0.2 0.2 or 1.0 E.U./dL   Nitrite, UA Negative    Leukocytes, UA Large (3+) (A) Negative     No diverticulosis  on last colonoscopy   She was trying to schedule her screening colonoscopy -they told her if she has symptoms that it is not screening   Patient Active Problem List   Diagnosis Date Noted  . Left sided abdominal pain 06/27/2018  . UTI (urinary tract infection) 06/27/2018  . Constipation 06/27/2018  . Colon cancer screening 03/21/2018  . Pulmonary nodules 03/21/2018  . Osteopenia 03/21/2018  . LBBB (left bundle branch block) 01/08/2018  . Chest pain 12/17/2017  . Palpitations 12/17/2017  . History of Wolff-Parkinson-White (WPW) syndrome 12/17/2017  . Hyperlipidemia 03/17/2017  . Elevated glucose level 03/17/2017  . Obesity 02/11/2016  . Lactose intolerance 02/11/2016  .  Stress reaction, emotional 01/19/2012  . Gynecologic exam normal 12/08/2010  . Routine general medical examination at a health care facility 11/29/2010  . Hot flashes 08/11/2010  . RHINITIS 08/15/2007  . FIBROCYSTIC BREAST DISEASE 09/24/2006  . HX, PERSONAL, COLONIC POLYPS 09/24/2006  . MIGRAINE, COMMON 08/25/2006  . DECREASED HEARING 08/25/2006  . Essential hypertension 08/25/2006  . Esophageal reflux 08/25/2006  . MIGRAINES, HX OF 08/25/2006   Past Medical History:  Diagnosis Date  . Depression   . GERD (gastroesophageal reflux disease)   . Headache(784.0)   . 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 WPW (Wolff-Parkinson-White syndrome)    See scanned history note   Past Surgical History:  Procedure Laterality Date  . BIOPSY BOWEL     small bowel biopsy- normal (colon plyps / colonoscopy 03/1999)  . BREAST BIOPSY Left 07/13/2013   Procedure: BREAST BIOPSY WITH NEEDLE LOCALIZATION;  Surgeon: Marcello Moores A. Cornett, MD;  Location: [facility];  Service: General;  Laterality: Left;  . BREAST SURGERY     Lt breast nodule - neg.  . CHOLECYSTECTOMY  2005   lap choli  . INNER EAR SURGERY  2011   Multiple surgeries Lt  ear. Hearing device implanted Lt ear 2011.  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 TUBAL LIGATION     Social History   Tobacco Use  . Smoking status: Former Smoker    Packs/day: 1.00    Years: 15.00    Pack years: 15.00    Types: Cigarettes    Last attempt to quit: 04/20/1986    Years since quitting: 32.2  . Smokeless tobacco: Never Used  Substance Use Topics  . Alcohol use: Yes    Alcohol/week: 0.0 standard drinks    Comment: 2-3 drinks a week  . Drug use: No   Family History  Problem Relation Age of Onset  . Cancer Mother        breast  . Diabetes Mother   . Stroke Mother   . Multiple sclerosis Mother   . Crohn's disease Brother    Allergies  Allergen Reactions  . Tetracycline Other (See Comments)    REACTION: rash,  passed out REACTION:  passed out   Current Outpatient Medications on File Prior to Visit  Medication Sig Dispense Refill  . tiZANidine (ZANAFLEX) 4 MG tablet Take 0.5-1 tablets (2-4 mg total) by mouth 4 (four) times daily as needed for muscle spasms. And headache 30 tablet 11   No current facility-administered medications on file prior to visit.     Review of Systems  Constitutional: Negative for activity change, appetite change, fatigue, fever and unexpected weight change.  HENT: Negative for congestion, ear pain, rhinorrhea, sinus pressure and sore throat.   Eyes: Negative for pain, redness and visual disturbance.  Respiratory: Negative for cough, shortness of breath and wheezing.   Cardiovascular: Negative for chest pain and palpitations.  Gastrointestinal: Positive for abdominal pain. Negative for abdominal distention, anal bleeding, blood in stool, constipation, diarrhea, nausea, rectal pain and vomiting.  Endocrine: Negative for polydipsia and polyuria.  Genitourinary: Positive for frequency and urgency. Negative for decreased urine volume, difficulty urinating, dysuria and vaginal discharge.  Musculoskeletal: Negative for arthralgias, back pain and myalgias.  Skin: Negative for pallor and rash.  Allergic/Immunologic: Negative for environmental allergies.  Neurological: Negative for dizziness, syncope and headaches.  Hematological: Negative for adenopathy. Does not bruise/bleed easily.  Psychiatric/Behavioral: Negative for decreased concentration and dysphoric mood. The patient is not nervous/anxious.        Objective:   Physical Exam Constitutional:      General: She is not in acute distress.    Appearance: Normal appearance. She is well-developed. She is obese. She is not ill-appearing.  HENT:     Head: Normocephalic and atraumatic.     Mouth/Throat:     Mouth: Mucous membranes are moist.     Pharynx: Oropharynx is clear.  Eyes:     General: No scleral icterus.     Conjunctiva/sclera: Conjunctivae normal.     Pupils: Pupils are equal, round, and reactive to light.  Neck:     Musculoskeletal: Normal range of motion and neck supple.  Cardiovascular:     Rate and Rhythm: Normal rate and regular rhythm.     Heart sounds: Normal heart sounds.  Pulmonary:     Effort: Pulmonary effort is normal. No respiratory distress.     Breath sounds: Normal breath sounds. No wheezing or rales.  Abdominal:     General: Bowel sounds are normal. There is no distension.     Palpations: Abdomen is soft. There is no hepatomegaly, splenomegaly, mass or pulsatile mass.     Tenderness: There  is abdominal tenderness in the periumbilical area, suprapubic area and left lower quadrant. There is no right CVA tenderness, left CVA tenderness, guarding or rebound. Negative signs include McBurney's sign.     Hernia: No hernia is present.     Comments: Tender over lower abd/worse on L and suprapubic No cva tenderness  Lump felt by pt in LLQ feels like soft tissue lipoma or cyst (mobile and nt)  Musculoskeletal:     Right lower leg: No edema.     Left lower leg: No edema.  Lymphadenopathy:     Cervical: No cervical adenopathy.  Skin:    General: Skin is warm and dry.     Coloration: Skin is not pale.     Findings: No erythema or rash.  Neurological:     Mental Status: She is alert.     Coordination: Coordination normal.     Deep Tendon Reflexes: Reflexes normal.  Psychiatric:        Mood and Affect: Mood normal.           Assessment & Plan:   Problem List Items Addressed This Visit      Genitourinary   UTI (urinary tract infection)    Pos ua with side pain / some frequency cx sent tx with bactrim ds for 5 d inst to alert if sympt worsen before cx returns       Relevant Medications   sulfamethoxazole-trimethoprim (BACTRIM DS,SEPTRA DS) 800-160 MG tablet   Other Relevant Orders   Urine Culture     Other   Left sided abdominal pain - Primary    In the setting  of pos ua and also recent constipation  abd xray today looking for evid of kidney stone and stool burden Will try miralax Also start bactrim ds       Relevant Orders   DG Abd 1 View   Constipation    In pt with h/o IBS (more commonly diarrhea)  Recommend miralax 1-2 times daily until bm  abd xray today as well        Other Visit Diagnoses    Flank pain       Relevant Orders   POCT Urinalysis Dipstick (Automated) (Completed)   DG Abd 1 View

## 2018-06-27 NOTE — Patient Instructions (Signed)
miralax for constipation - 1-2 servings a day  It takes a few days to work Keep drinking lots of water   Xray of abdomen today   Take bactrim as directed for suspected uti  We will culture urine also   Let us know if symptoms worsen

## 2018-06-28 ENCOUNTER — Encounter: Payer: Self-pay | Admitting: Family Medicine

## 2018-06-28 LAB — URINE CULTURE
MICRO NUMBER:: 294099
SPECIMEN QUALITY:: ADEQUATE

## 2018-08-17 ENCOUNTER — Encounter: Payer: Self-pay | Admitting: Family Medicine

## 2018-08-17 ENCOUNTER — Ambulatory Visit (INDEPENDENT_AMBULATORY_CARE_PROVIDER_SITE_OTHER)
Admission: RE | Admit: 2018-08-17 | Discharge: 2018-08-17 | Disposition: A | Discharge status: Home / Self Care | Payer: 59 | Source: Ambulatory Visit | Attending: Family Medicine | Admitting: Family Medicine

## 2018-08-17 ENCOUNTER — Ambulatory Visit: Payer: 59 | Admitting: Family Medicine

## 2018-08-17 ENCOUNTER — Other Ambulatory Visit: Payer: Self-pay

## 2018-08-17 VITALS — BP 120/68 | HR 83 | Temp 98.3°F | Ht 61.0 in | Wt 156.0 lb

## 2018-08-17 DIAGNOSIS — K582 Mixed irritable bowel syndrome: Secondary | ICD-10-CM

## 2018-08-17 DIAGNOSIS — R1032 Left lower quadrant pain: Secondary | ICD-10-CM

## 2018-08-17 DIAGNOSIS — R109 Unspecified abdominal pain: Secondary | ICD-10-CM | POA: Diagnosis not present

## 2018-08-17 DIAGNOSIS — K589 Irritable bowel syndrome without diarrhea: Secondary | ICD-10-CM | POA: Insufficient documentation

## 2018-08-17 NOTE — Assessment & Plan Note (Signed)
Currently diarrhea  Some L sided abd pain (? Cramps)

## 2018-08-17 NOTE — Assessment & Plan Note (Signed)
When bearing weight  Today- considerable problems getting up/down from the table  Pain on full ext rotation and flexion of the hip  Unsure if related to her L abd pain or not  No obv hernia palpated  Hip XR and labs today

## 2018-08-17 NOTE — Progress Notes (Signed)
Subjective:    Patient ID: Felicia Robbins, female    DOB: 11-Jul-1955, 63 y.o.   MRN: [id number]  HPI Here for low abd pain  Left lower abdomen- 2 weeks  Started after a hike- stood up and it started hurting  Is tender to the touch on the L (she points to her pelvic area)-thought it may be a groin muscle pull/strain  Hurts to bear weight on that leg   Sometimes is radiates up to left low abdomen and left upper abdomen  That pain is all the time (even when not walking)   No h/o hip arthritis   No fever or other symptoms   Her IBS acted up also  This time diarrhea- moreso when she begins a walk  ? If this is an "intense" flare of ibs (the pain)  Does not feel crampy  No blood in her stool  No fever / feels ok  No sob  No n/v   No urinary symptoms   Has ST and ears ring at time from allergies    She was seen her for L sided low abd pain /flank pain on 3/9 Office Visit on 06/27/2018  Component Date Value Ref Range Status  . Color, UA 06/27/2018 Yellow   Final  . Clarity, UA 06/27/2018 Clear   Final  . Glucose, UA 06/27/2018 Negative  Negative Final  . Bilirubin, UA 06/27/2018 Negative   Final  . Ketones, UA 06/27/2018 Negative   Final  . Spec Grav, UA 06/27/2018 1.020  1.010 - 1.025 Final  . Blood, UA 06/27/2018 25 Ery/uL   Final  . pH, UA 06/27/2018 6.0  5.0 - 8.0 Final  . Protein, UA 06/27/2018 Negative  Negative Final  . Urobilinogen, UA 06/27/2018 0.2  0.2 or 1.0 E.U./dL Final  . Nitrite, UA 06/27/2018 Negative   Final  . Leukocytes, UA 06/27/2018 Large (3+)* Negative Final  . MICRO NUMBER: 06/27/2018 [id number]   Final  . SPECIMEN QUALITY: 06/27/2018 Adequate   Final  . Sample Source 06/27/2018 NOT GIVEN   Final  . STATUS: 06/27/2018 FINAL   Final  . ISOLATE 1: 06/27/2018 Multiple organisms present, each less than 10,000 CFU/mL. These organisms, commonly found on external and internal genitalia, are considered to be colonizers. No further testing performed.    Final    She was tx for uti   abd xray: DG Abd 1 View (Accession [id number]) [id number])  Imaging  Date: 06/27/2018 Department: [place] Released By: Ellamae Sia Authorizing: Evalynne Locurto, Wynelle Fanny, MD  Exam Information   Status Exam Begun  Exam Ended   Final [99] 06/27/2018 3:19 PM 06/27/2018 3:33 PM  PACS Images   Show images for DG Abd 1 View  Study Result   CLINICAL DATA:  Abdominal pain and constipation.  Hematuria  EXAM: ABDOMEN - 1 VIEW  COMPARISON:  None.  FINDINGS: There are apparent phleboliths in the pelvis. There is moderate stool in the colon. There is no bowel dilatation or air-fluid level to suggest bowel obstruction. No free air. There are surgical clips in the right upper quadrant.  IMPRESSION: No bowel obstruction or free air. Moderate stool throughout colon. Apparent phleboliths in the pelvis bilaterally.   Electronically Signed   By: Lowella Grip III M.D.   On: 06/28/2018 08:21   Patient Active Problem List   Diagnosis Date Noted  . Left groin pain 08/17/2018  . IBS (irritable bowel syndrome) 08/17/2018  . Left sided abdominal  pain 06/27/2018  . Constipation 06/27/2018  . Colon cancer screening 03/21/2018  . Pulmonary nodules 03/21/2018  . Osteopenia 03/21/2018  . LBBB (left bundle branch block) 01/08/2018  . Chest pain 12/17/2017  . Palpitations 12/17/2017  . History of Wolff-Parkinson-White (WPW) syndrome 12/17/2017  . Hyperlipidemia 03/17/2017  . Elevated glucose level 03/17/2017  . Obesity 02/11/2016  . Lactose intolerance 02/11/2016  . Stress reaction, emotional 01/19/2012  . Gynecologic exam normal 12/08/2010  . Routine general medical examination at a health care facility 11/29/2010  . Hot flashes 08/11/2010  . RHINITIS 08/15/2007  . FIBROCYSTIC BREAST DISEASE 09/24/2006  . HX, PERSONAL, COLONIC POLYPS 09/24/2006  . MIGRAINE, COMMON 08/25/2006  . DECREASED HEARING 08/25/2006  .  Essential hypertension 08/25/2006  . Esophageal reflux 08/25/2006  . MIGRAINES, HX OF 08/25/2006   Past Medical History:  Diagnosis Date  . Depression   . GERD (gastroesophageal reflux disease)   . Headache(784.0)   . 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 WPW (Wolff-Parkinson-White syndrome)    See scanned history note   Past Surgical History:  Procedure Laterality Date  . BIOPSY BOWEL     small bowel biopsy- normal (colon plyps / colonoscopy 03/1999)  . BREAST BIOPSY Left 07/13/2013   Procedure: BREAST BIOPSY WITH NEEDLE LOCALIZATION;  Surgeon: Marcello Moores A. Cornett, MD;  Location: [facility];  Service: General;  Laterality: Left;  . BREAST SURGERY     Lt breast nodule - neg.  . CHOLECYSTECTOMY  2005   lap choli  . INNER EAR SURGERY  2011   Multiple surgeries Lt ear. Hearing device implanted Lt ear 2011.  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 TUBAL LIGATION     Social History   Tobacco Use  . Smoking status: Former Smoker    Packs/day: 1.00    Years: 15.00    Pack years: 15.00    Types: Cigarettes    Last attempt to quit: 04/20/1986    Years since quitting: 32.3  . Smokeless tobacco: Never Used  Substance Use Topics  . Alcohol use: Yes    Alcohol/week: 0.0 standard drinks    Comment: 2-3 drinks a week  . Drug use: No   Family History  Problem Relation Age of Onset  . Cancer Mother        breast  . Diabetes Mother   . Stroke Mother   . Multiple sclerosis Mother   . Crohn's disease Brother    Allergies  Allergen Reactions  . Tetracycline Other (See Comments)    REACTION: rash, passed out REACTION:  passed out   Current Outpatient Medications on File Prior to Visit  Medication Sig Dispense Refill  . ibuprofen (ADVIL) 200 MG tablet Take 400 mg by mouth every 6 (six) hours as needed (as needed for pain).    Marland Kitchen tiZANidine (ZANAFLEX) 4 MG tablet Take 0.5-1 tablets (2-4 mg total) by mouth 4 (four) times daily as needed for muscle  spasms. And headache 30 tablet 11   No current facility-administered medications on file prior to visit.     Patient Active Problem List   Diagnosis Date Noted  . Left groin pain 08/17/2018  . IBS (irritable bowel syndrome) 08/17/2018  . Left sided abdominal pain 06/27/2018  . Constipation 06/27/2018  . Colon cancer screening 03/21/2018  . Pulmonary nodules 03/21/2018  . Osteopenia 03/21/2018  . LBBB (left bundle branch block) 01/08/2018  . Chest pain 12/17/2017  . Palpitations  12/17/2017  . History of Wolff-Parkinson-White (WPW) syndrome 12/17/2017  . Hyperlipidemia 03/17/2017  . Elevated glucose level 03/17/2017  . Obesity 02/11/2016  . Lactose intolerance 02/11/2016  . Stress reaction, emotional 01/19/2012  . Gynecologic exam normal 12/08/2010  . Routine general medical examination at a health care facility 11/29/2010  . Hot flashes 08/11/2010  . RHINITIS 08/15/2007  . FIBROCYSTIC BREAST DISEASE 09/24/2006  . HX, PERSONAL, COLONIC POLYPS 09/24/2006  . MIGRAINE, COMMON 08/25/2006  . DECREASED HEARING 08/25/2006  . Essential hypertension 08/25/2006  . Esophageal reflux 08/25/2006  . MIGRAINES, HX OF 08/25/2006   Past Medical History:  Diagnosis Date  . Depression   . GERD (gastroesophageal reflux disease)   . Headache(784.0)   . 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 WPW (Wolff-Parkinson-White syndrome)    See scanned history note   Past Surgical History:  Procedure Laterality Date  . BIOPSY BOWEL     small bowel biopsy- normal (colon plyps / colonoscopy 03/1999)  . BREAST BIOPSY Left 07/13/2013   Procedure: BREAST BIOPSY WITH NEEDLE LOCALIZATION;  Surgeon: Marcello Moores A. Cornett, MD;  Location: [facility];  Service: General;  Laterality: Left;  . BREAST SURGERY     Lt breast nodule - neg.  . CHOLECYSTECTOMY  2005   lap choli  . INNER EAR SURGERY  2011   Multiple surgeries Lt ear. Hearing device implanted Lt ear 2011.  Marland Kitchen PAROTID GLAND TUMOR  EXCISION  2000   left  . TUBAL LIGATION     Social History   Tobacco Use  . Smoking status: Former Smoker    Packs/day: 1.00    Years: 15.00    Pack years: 15.00    Types: Cigarettes    Last attempt to quit: 04/20/1986    Years since quitting: 32.3  . Smokeless tobacco: Never Used  Substance Use Topics  . Alcohol use: Yes    Alcohol/week: 0.0 standard drinks    Comment: 2-3 drinks a week  . Drug use: No   Family History  Problem Relation Age of Onset  . Cancer Mother        breast  . Diabetes Mother   . Stroke Mother   . Multiple sclerosis Mother   . Crohn's disease Brother    Allergies  Allergen Reactions  . Tetracycline Other (See Comments)    REACTION: rash, passed out REACTION:  passed out   Current Outpatient Medications on File Prior to Visit  Medication Sig Dispense Refill  . ibuprofen (ADVIL) 200 MG tablet Take 400 mg by mouth every 6 (six) hours as needed (as needed for pain).    Marland Kitchen tiZANidine (ZANAFLEX) 4 MG tablet Take 0.5-1 tablets (2-4 mg total) by mouth 4 (four) times daily as needed for muscle spasms. And headache 30 tablet 11   No current facility-administered medications on file prior to visit.     Review of Systems  Constitutional: Positive for activity change. Negative for appetite change, fatigue, fever and unexpected weight change.  HENT: Negative for congestion, ear pain, rhinorrhea, sinus pressure and sore throat.   Eyes: Negative for pain, redness and visual disturbance.  Respiratory: Negative for cough, shortness of breath and wheezing.   Cardiovascular: Negative for chest pain and palpitations.  Gastrointestinal: Positive for abdominal pain. Negative for anal bleeding, blood in stool, constipation, diarrhea, nausea, rectal pain and vomiting.  Endocrine: Negative for polydipsia and polyuria.  Genitourinary: Positive for pelvic pain. Negative for dysuria, flank pain, frequency, hematuria, urgency,  vaginal bleeding and vaginal discharge.   Musculoskeletal: Positive for arthralgias. Negative for back pain and myalgias.       Gait affected by L groin pain   Skin: Negative for pallor and rash.  Allergic/Immunologic: Negative for environmental allergies.  Neurological: Negative for dizziness, syncope and headaches.  Hematological: Negative for adenopathy. Does not bruise/bleed easily.  Psychiatric/Behavioral: Negative for decreased concentration and dysphoric mood. The patient is not nervous/anxious.        Objective:   Physical Exam Constitutional:      General: She is not in acute distress.    Appearance: Normal appearance. She is normal weight. She is not ill-appearing, toxic-appearing or diaphoretic.  HENT:     Head: Normocephalic and atraumatic.     Mouth/Throat:     Mouth: Mucous membranes are moist.     Pharynx: Oropharynx is clear.  Eyes:     General: No scleral icterus.    Extraocular Movements: Extraocular movements intact.     Conjunctiva/sclera: Conjunctivae normal.     Pupils: Pupils are equal, round, and reactive to light.  Neck:     Musculoskeletal: Normal range of motion.  Cardiovascular:     Rate and Rhythm: Normal rate and regular rhythm.     Pulses: Normal pulses.  Pulmonary:     Effort: Pulmonary effort is normal. No respiratory distress.     Breath sounds: Normal breath sounds. No wheezing or rales.  Abdominal:     General: Abdomen is flat. Bowel sounds are normal. There is no distension.     Palpations: Abdomen is soft. There is no hepatomegaly, splenomegaly, mass or pulsatile mass.     Tenderness: There is abdominal tenderness in the epigastric area, left upper quadrant and left lower quadrant. There is no right CVA tenderness, left CVA tenderness, guarding or rebound. Negative signs include Murphy's sign, McBurney's sign and psoas sign.     Hernia: No hernia is present.  Musculoskeletal:     Right lower leg: No edema.     Left lower leg: No edema.     Comments: Pain on full external  rotation of L hip (at end point)  Also with full flexion  Pt favors R leg with gait due to significant pain with walking/ bearing wt on L leg  No bony tenderness   Lymphadenopathy:     Cervical: No cervical adenopathy.  Skin:    General: Skin is warm and dry.     Capillary Refill: Capillary refill takes less than 2 seconds.     Coloration: Skin is not jaundiced or pale.     Findings: No bruising, erythema, lesion or rash.  Neurological:     General: No focal deficit present.     Mental Status: She is alert.  Psychiatric:        Mood and Affect: Mood normal.           Assessment & Plan:   Problem List Items Addressed This Visit      Digestive   IBS (irritable bowel syndrome)    Currently diarrhea  Some L sided abd pain (? Cramps)          Other   Left sided abdominal pain    With worsened IBS (diarrhea now-was prev constipation)  Unsure if this is related to her groin pain with weight bearing  Tender over L abd ( worse lower down)  No rebound or guarding  Cbc/cmet and lipase today  Will likely need CT abd/pelvis-pending lab result first  Relevant Orders   CBC with Differential/Platelet   Comprehensive metabolic panel   Lipase   Left groin pain - Primary    When bearing weight  Today- considerable problems getting up/down from the table  Pain on full ext rotation and flexion of the hip  Unsure if related to her L abd pain or not  No obv hernia palpated  Hip XR and labs today      Relevant Orders   DG HIP UNILAT WITH PELVIS 2-3 VIEWS LEFT

## 2018-08-17 NOTE — Assessment & Plan Note (Signed)
With worsened IBS (diarrhea now-was prev constipation)  Unsure if this is related to her groin pain with weight bearing  Tender over L abd ( worse lower down)  No rebound or guarding  Cbc/cmet and lipase today  Will likely need CT abd/pelvis-pending lab result first

## 2018-08-17 NOTE — Patient Instructions (Signed)
Labs today- chemistries / liver and pancreas enzyme as well as complete blood count Also xray of hip   Stay off your leg for now  Use warm compress in groin if needed   We more than likely may order a CT scan depending on labs

## 2018-08-18 ENCOUNTER — Encounter: Payer: Self-pay | Admitting: Family Medicine

## 2018-08-18 ENCOUNTER — Telehealth: Payer: Self-pay | Admitting: Family Medicine

## 2018-08-18 DIAGNOSIS — R109 Unspecified abdominal pain: Secondary | ICD-10-CM

## 2018-08-18 DIAGNOSIS — R1032 Left lower quadrant pain: Secondary | ICD-10-CM

## 2018-08-18 DIAGNOSIS — R1084 Generalized abdominal pain: Secondary | ICD-10-CM

## 2018-08-18 LAB — CBC WITH DIFFERENTIAL/PLATELET
Basophils Absolute: 0.1 10*3/uL (ref 0.0–0.1)
Basophils Relative: 1.3 % (ref 0.0–3.0)
Eosinophils Absolute: 0.1 10*3/uL (ref 0.0–0.7)
Eosinophils Relative: 1.2 % (ref 0.0–5.0)
HCT: 41.7 % (ref 36.0–46.0)
Hemoglobin: 14 g/dL (ref 12.0–15.0)
Lymphocytes Relative: 33.5 % (ref 12.0–46.0)
Lymphs Abs: 2.5 10*3/uL (ref 0.7–4.0)
MCHC: 33.6 g/dL (ref 30.0–36.0)
MCV: 92.8 fl (ref 78.0–100.0)
Monocytes Absolute: 0.7 10*3/uL (ref 0.1–1.0)
Monocytes Relative: 9.8 % (ref 3.0–12.0)
Neutro Abs: 4.1 10*3/uL (ref 1.4–7.7)
Neutrophils Relative %: 54.2 % (ref 43.0–77.0)
Platelets: 283 10*3/uL (ref 150.0–400.0)
RBC: 4.49 Mil/uL (ref 3.87–5.11)
RDW: 14.7 % (ref 11.5–15.5)
WBC: 7.5 10*3/uL (ref 4.0–10.5)

## 2018-08-18 LAB — COMPREHENSIVE METABOLIC PANEL
ALT: 16 U/L (ref 0–35)
AST: 18 U/L (ref 0–37)
Albumin: 4.5 g/dL (ref 3.5–5.2)
Alkaline Phosphatase: 74 U/L (ref 39–117)
BUN: 16 mg/dL (ref 6–23)
CO2: 28 mEq/L (ref 19–32)
Calcium: 9.5 mg/dL (ref 8.4–10.5)
Chloride: 104 mEq/L (ref 96–112)
Creatinine, Ser: 0.86 mg/dL (ref 0.40–1.20)
GFR: 66.9 mL/min (ref >60.00)
Glucose, Bld: 93 mg/dL (ref 70–99)
Potassium: 4.6 mEq/L (ref 3.5–5.1)
Sodium: 140 mEq/L (ref 135–145)
Total Bilirubin: 0.5 mg/dL (ref 0.2–1.2)
Total Protein: 7.6 g/dL (ref 6.0–8.3)

## 2018-08-18 LAB — LIPASE: Lipase: 37 U/L (ref 11.0–59.0)

## 2018-08-18 NOTE — Telephone Encounter (Signed)
-----   Message from Myriam Forehand, [place] sent at 08/18/2018  1:26 PM EDT ----- Called and spoke with pt. Pt advised and voiced understanding and would like to follow through with doing the CT.

## 2018-08-18 NOTE — Telephone Encounter (Signed)
Felicia Robbins -please call her to set it up  Tomorrow or early next week would be optimal if possible  She had labs yesterday Thanks

## 2018-08-19 ENCOUNTER — Other Ambulatory Visit: Payer: Self-pay

## 2018-08-19 ENCOUNTER — Ambulatory Visit (INDEPENDENT_AMBULATORY_CARE_PROVIDER_SITE_OTHER)
Admission: RE | Admit: 2018-08-19 | Discharge: 2018-08-19 | Disposition: A | Discharge status: Home / Self Care | Payer: 59 | Source: Ambulatory Visit | Attending: Family Medicine | Admitting: Family Medicine

## 2018-08-19 DIAGNOSIS — R1084 Generalized abdominal pain: Secondary | ICD-10-CM | POA: Diagnosis not present

## 2018-08-19 MED ORDER — IOHEXOL 300 MG/ML  SOLN
100.0000 mL | Freq: Once | INTRAMUSCULAR | Status: AC | PRN
  Administered 2018-08-19: 100 mL via INTRAVENOUS

## 2018-08-19 NOTE — Telephone Encounter (Signed)
Patient scheduled at [facility] today at 1:00pm patient aware.

## 2018-08-22 ENCOUNTER — Telehealth: Payer: Self-pay | Admitting: Family Medicine

## 2018-08-22 DIAGNOSIS — R109 Unspecified abdominal pain: Secondary | ICD-10-CM

## 2018-08-22 NOTE — Telephone Encounter (Signed)
I sent referral- she can try otc tx for yeast, if it does not help please let me know  Monistat or generic is fine  Thanks

## 2018-08-22 NOTE — Telephone Encounter (Signed)
Spoken and notified patient of Dr Marliss Coots comments. Patient verbalized understanding and agreeable to see GI.  Also patient stated that she believe she has a yeast infection. Patient stated that should she tried OTC or something else?

## 2018-08-22 NOTE — Telephone Encounter (Signed)
Patient received her CT results last week and was told to call back, if she wasn't feeling well.  Patient said over the weekend she started having pain in lower abdomen.

## 2018-08-22 NOTE — Telephone Encounter (Signed)
Called patient and gave her Dr Alba Cory advice and what otc medicine she recommended her to use.Marland Kitchen Also set up the Virtual visit with Dr Silverio Decamp for Thursday, May 7th at 10:00am.

## 2018-08-22 NOTE — Telephone Encounter (Signed)
I want to see if I can get an asap GI appt for her (I know she had to put off her colonoscopy but this would be just for an eval)  Is she agreeable with that?  Thanks

## 2018-08-24 ENCOUNTER — Encounter: Payer: Self-pay | Admitting: General Surgery

## 2018-08-25 ENCOUNTER — Ambulatory Visit (INDEPENDENT_AMBULATORY_CARE_PROVIDER_SITE_OTHER): Payer: 59 | Admitting: Gastroenterology

## 2018-08-25 ENCOUNTER — Other Ambulatory Visit: Payer: Self-pay

## 2018-08-25 ENCOUNTER — Encounter: Payer: Self-pay | Admitting: Gastroenterology

## 2018-08-25 VITALS — Ht 61.0 in | Wt 155.0 lb

## 2018-08-25 DIAGNOSIS — R194 Change in bowel habit: Secondary | ICD-10-CM

## 2018-08-25 DIAGNOSIS — R140 Abdominal distension (gaseous): Secondary | ICD-10-CM

## 2018-08-25 DIAGNOSIS — R1032 Left lower quadrant pain: Secondary | ICD-10-CM

## 2018-08-25 NOTE — Patient Instructions (Addendum)
Trial of lactose-free diet  Schedule for colonoscopy with biopsies, next available appointment.  You have been scheduled for a colonoscopy. Please follow written instructions given to you at your visit today.   Please pick up your prep supplies at the pharmacy within the next 1-3 days. (We have given you a sample kit today)  If you use inhalers (even only as needed), please bring them with you on the day of your procedure. Lactose-Free Diet, Adult If you have lactose intolerance, you are not able to digest lactose. Lactose is a natural sugar found mainly in dairy milk and dairy products. You may need to avoid all foods and beverages that contain lactose. A lactose-free diet can help you do this. Which foods have lactose? Lactose is found in dairy milk and dairy products, such as:  Yogurt.  Cheese.  Butter.  Margarine.  Sour cream.  Cream.  Whipped toppings and nondairy creamers.  Ice cream and other dairy-based desserts. Lactose is also found in foods or products made with dairy milk or milk ingredients. To find out whether a food contains dairy milk or a milk ingredient, look at the ingredients list. Avoid foods with the statement "May contain milk" and foods that contain:  Milk powder.  Whey.  Curd.  Caseinate.  Lactose.  Lactalbumin.  Lactoglobulin. What are alternatives to dairy milk and foods made with milk products?  Lactose-free milk.  Soy milk with added calcium and vitamin D.  Almond milk, coconut milk, rice milk, or other nondairy milk alternatives with added calcium and vitamin D. Note that these are low in protein.  Soy products, such as soy yogurt, soy cheese, soy ice cream, and soy-based sour cream.  Other nut milk products, such as almond yogurt, almond cheese, cashew yogurt, cashew cheese, cashew ice cream, coconut yogurt, and coconut ice cream. What are tips for following this plan?  Do not consume foods, beverages, vitamins, minerals, or  medicines containing lactose. Read ingredient lists carefully.  Look for the words "lactose-free" on labels.  Use lactase enzyme drops or tablets as directed by your health care provider.  Use lactose-free milk or a milk alternative, such as soy milk or almond milk, for drinking and cooking.  Make sure you get enough calcium and vitamin D in your diet. A lactose-free eating plan can be lacking in these important nutrients.  Take calcium and vitamin D supplements as directed by your health care provider. Talk to your health care provider about supplements if you are not able to get enough calcium and vitamin D from food. What foods can I eat?  Fruits All fresh, canned, frozen, or dried fruits that are not processed with lactose. Vegetables All fresh, frozen, and canned vegetables without cheese, cream, or butter sauces. Grains Any that are not made with dairy milk or dairy products. Meats and other proteins Any meat, fish, poultry, and other protein sources that are not made with dairy milk or dairy products. Soy cheese and yogurt. Fats and oils Any that are not made with dairy milk or dairy products. Beverages Lactose-free milk. Soy, rice, or almond milk with added calcium and vitamin D. Fruit and vegetable juices. Sweets and desserts Any that are not made with dairy milk or dairy products. Seasonings and condiments Any that are not made with dairy milk or dairy products. Calcium Calcium is found in many foods that contain lactose and is important for bone health. The amount of calcium you need depends on your age:  Adults younger than  50 years: 1,000 mg of calcium a day.  Adults older than 50 years: 1,200 mg of calcium a day. If you are not getting enough calcium, you may get it from other sources, including:  Orange juice with calcium added. There are 300-350 mg of calcium in 1 cup of orange juice.  Calcium-fortified soy milk. There are 300-400 mg of calcium in 1 cup of  calcium-fortified soy milk.  Calcium-fortified rice or almond milk. There are 300 mg of calcium in 1 cup of calcium-fortified rice or almond milk.  Calcium-fortified breakfast cereals. There are 100-1,000 mg of calcium in calcium-fortified breakfast cereals.  Spinach, cooked. There are 145 mg of calcium in  cup of cooked spinach.  Edamame, cooked. There are 130 mg of calcium in  cup of cooked edamame.  Collard greens, cooked. There are 125 mg of calcium in  cup of cooked collard greens.  Kale, frozen or cooked. There are 90 mg of calcium in  cup of cooked or frozen kale.  Almonds. There are 95 mg of calcium in  cup of almonds.  Broccoli, cooked. There are 60 mg of calcium in 1 cup of cooked broccoli. The items listed above may not be a complete list of recommended foods and beverages. Contact a dietitian for more options. What foods are not recommended? Fruits None, unless they are made with dairy milk or dairy products. Vegetables None, unless they are made with dairy milk or dairy products. Grains Any grains that are made with dairy milk or dairy products. Meats and other proteins None, unless they are made with dairy milk or dairy products. Dairy All dairy products, including milk, goat's milk, buttermilk, kefir, acidophilus milk, flavored milk, evaporated milk, condensed milk, dulce de [place], eggnog, yogurt, cheese, and cheese spreads. Fats and oils Any that are made with milk or milk products. Margarines and salad dressings that contain milk or cheese. Cream. Half and half. Cream cheese. Sour cream. Chip dips made with sour cream or yogurt. Beverages Hot chocolate. Cocoa with lactose. Instant iced teas. Powdered fruit drinks. Smoothies made with dairy milk or yogurt. Sweets and desserts Any that are made with milk or milk products. Seasonings and condiments Chewing gum that has lactose. Spice blends if they contain lactose. Artificial sweeteners that contain lactose.  Nondairy creamers. The items listed above may not be a complete list of foods and beverages to avoid. Contact a dietitian for more information. Summary  If you are lactose intolerant, it means that you have a hard time digesting lactose, a natural sugar found in milk and milk products.  Following a lactose-free diet can help you manage this condition.  Calcium is important for bone health and is found in many foods that contain lactose. Talk with your health care provider about other sources of calcium. This information is not intended to replace advice given to you by your health care provider. Make sure you discuss any questions you have with your health care provider. Document Released: 09/26/2001 Document Revised: 05/04/2017 Document Reviewed: 05/04/2017 Elsevier Interactive Patient Education  2019 [employer].

## 2018-08-25 NOTE — Progress Notes (Signed)
Felicia Robbins    [id number]    [birth year]  Primary Care Physician:Tower, Wynelle Fanny, MD  Referring Physician: Tower, Wynelle Fanny, MD [place], [place] [address]  This service was provided via audio and video telemedicine (Doximity) due to [place] 19 pandemic.  Patient location: Home Provider location: Office Used 2 patient identifiers to confirm the correct person. Explained the limitations in evaluation and management via telemedicine. Patient is aware of potential medical charges for this visit.  Patient consented to this virtual visit.  The persons participating in this telemedicine service were myself and the patient    Chief complaint: Left lower quadrant abdominal pain  HPI:  63 year old female previously followed by Dr. Sharlett Iles, last seen in 2009  Abdominal pain left side since 1st week of March.  Left lower quadrant abdominal pain is progressively getting worse.  She had abdominal x-ray June 27, 2018: No bowel obstruction or free air, moderate stool throughout the colon.  Phleboliths in the pelvis bilaterally  2 weeks ago was walking on trail, after that she developed sharp pain in her pelvis.  X-ray hip 08/17/18: No acute osseous injury of the left hip.  CT abd & pelvis 08/19/18: No acute abnormality   She is having loose semi formed bowel movements , not watery.  Increased frequency compared to baseline. She also has lot of gas and bloating  Doesn't drink milk but eats cheese. Uses almond milk as she is lactose intolerant.  No rectal bleeding or melena.   No weight loss or decreased appetite.  Colonoscopy 12/21/18: Normal    Outpatient Encounter Medications as of 08/25/2018  Medication Sig  . ibuprofen (ADVIL) 200 MG tablet Take 400 mg by mouth every 6 (six) hours as needed (as needed for pain).  Marland Kitchen tiZANidine (ZANAFLEX) 4 MG tablet Take 0.5-1 tablets (2-4 mg total) by mouth 4 (four) times daily as needed for muscle spasms. And  headache   No facility-administered encounter medications on file as of 08/25/2018.     Allergies as of 08/25/2018 - Review Complete 08/25/2018  Allergen Reaction Noted  . Tetracycline Other (See Comments) 08/25/2006    Past Medical History:  Diagnosis Date  . Depression   . GERD (gastroesophageal reflux disease)   . Headache(784.0)   . 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 WPW (Wolff-Parkinson-White syndrome)    See scanned history note    Past Surgical History:  Procedure Laterality Date  . BIOPSY BOWEL     small bowel biopsy- normal (colon plyps / colonoscopy 03/1999)  . BREAST BIOPSY Left 07/13/2013   Procedure: BREAST BIOPSY WITH NEEDLE LOCALIZATION;  Surgeon: Marcello Moores A. Cornett, MD;  Location: [facility];  Service: General;  Laterality: Left;  . BREAST SURGERY     Lt breast nodule - neg.  . CHOLECYSTECTOMY  2005   lap choli  . INNER EAR SURGERY  2011   Multiple surgeries Lt ear. Hearing device implanted Lt ear 2011.  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 TUBAL LIGATION      Family History  Problem Relation Age of Onset  . Cancer Mother        breast  . Diabetes Mother   . Stroke Mother   . Multiple sclerosis Mother   . Crohn's disease Brother   . Cancer Sister 97       breast  . Diabetes Brother  Social History   Socioeconomic History  . Marital status: Legally Separated    Spouse name: Not on file  . Number of children: Not on file  . Years of education: Not on file  . Highest education level: Not on file  Occupational History  . Occupation: IT  Social Needs  . Financial resource strain: Not on file  . Food insecurity:    Worry: Not on file    Inability: Not on file  . Transportation needs:    Medical: Not on file    Non-medical: Not on file  Tobacco Use  . Smoking status: Former Smoker    Packs/day: 1.00    Years: 15.00    Pack years: 15.00    Types: Cigarettes    Last attempt to quit: 04/20/1986    Years  since quitting: 32.3  . Smokeless tobacco: Never Used  Substance and Sexual Activity  . Alcohol use: Yes    Alcohol/week: 0.0 standard drinks    Comment: 2-3 drinks a week  . Drug use: No  . Sexual activity: Not on file  Lifestyle  . Physical activity:    Days per week: Not on file    Minutes per session: Not on file  . Stress: Not on file  Relationships  . Social connections:    Talks on phone: Not on file    Gets together: Not on file    Attends religious service: Not on file    Active member of club or organization: Not on file    Attends meetings of clubs or organizations: Not on file    Relationship status: Not on file  . Intimate partner violence:    Fear of current or ex partner: Not on file    Emotionally abused: Not on file    Physically abused: Not on file    Forced sexual activity: Not on file  Other Topics Concern  . Not on file  Social History Narrative  . Not on file      Review of systems: Review of Systems as per HPI All other systems reviewed and are negative.   Physical Exam: Vitals were not taken and physical exam was not performed during this virtual visit.  Data Reviewed:  Reviewed labs, radiology imaging, old records and pertinent past GI work up   Assessment and Plan/Recommendations:  63 year old female with complaints of left lower quadrant abdominal discomfort, change in bowel habits. She is past due for colorectal cancer screening, will proceed with colonoscopy with biopsies Increased bowel frequency, gas and abdominal cramping could be secondary to lactose intolerance.  Advised patient to do a trial of lactose-free diet If no significant abnormality on colonoscopy, left lower quadrant abdominal pain and pelvic pain could be musculoskeletal ?  Left hip arthritis.  Imaging unrevealing for any significant pathology.  The risks and benefits as well as alternatives of endoscopic procedure(s) have been discussed and reviewed. All questions  answered. The patient agrees to proceed.     Damaris Hippo , MD   CC: Tower, Wynelle Fanny, MD

## 2018-08-26 ENCOUNTER — Telehealth: Payer: Self-pay | Admitting: *Deleted

## 2018-08-26 NOTE — Telephone Encounter (Signed)
Voicemail is full unable to leave message

## 2018-08-26 NOTE — Telephone Encounter (Signed)
Voicemail box is full - unable to leave message

## 2018-08-29 ENCOUNTER — Ambulatory Visit (AMBULATORY_SURGERY_CENTER): Payer: 59 | Admitting: Gastroenterology

## 2018-08-29 ENCOUNTER — Encounter: Payer: Self-pay | Admitting: Gastroenterology

## 2018-08-29 ENCOUNTER — Other Ambulatory Visit: Payer: Self-pay

## 2018-08-29 VITALS — BP 134/71 | HR 70 | Temp 98.6°F | Resp 22 | Ht 61.0 in | Wt 155.0 lb

## 2018-08-29 DIAGNOSIS — K635 Polyp of colon: Secondary | ICD-10-CM

## 2018-08-29 DIAGNOSIS — D125 Benign neoplasm of sigmoid colon: Secondary | ICD-10-CM

## 2018-08-29 DIAGNOSIS — Z1211 Encounter for screening for malignant neoplasm of colon: Secondary | ICD-10-CM

## 2018-08-29 DIAGNOSIS — D123 Benign neoplasm of transverse colon: Secondary | ICD-10-CM

## 2018-08-29 DIAGNOSIS — D129 Benign neoplasm of anus and anal canal: Secondary | ICD-10-CM

## 2018-08-29 DIAGNOSIS — D120 Benign neoplasm of cecum: Secondary | ICD-10-CM

## 2018-08-29 DIAGNOSIS — R1032 Left lower quadrant pain: Secondary | ICD-10-CM

## 2018-08-29 DIAGNOSIS — D128 Benign neoplasm of rectum: Secondary | ICD-10-CM

## 2018-08-29 HISTORY — PX: COLONOSCOPY: SHX174

## 2018-08-29 MED ORDER — SODIUM CHLORIDE 0.9 % IV SOLN: 500.0000 mL | Freq: Once | INTRAVENOUS | Status: DC

## 2018-08-29 NOTE — Progress Notes (Signed)
A/ox3, pleased with MAC, report to RN 

## 2018-08-29 NOTE — Op Note (Signed)
[facility] Patient Name: Felicia Robbins Procedure Date: 08/29/2018 3:54 PM MRN: [id number] Endoscopist: Mauri Pole , MD Age: 63 Referring MD:  Date of Birth: 10/11/1955 Gender: Female Account #: [id number] Procedure:                Colonoscopy Indications:              Screening for colorectal malignant neoplasm Medicines:                Monitored Anesthesia Care Procedure:                Pre-Anesthesia Assessment:                           - Prior to the procedure, a History and Physical                            was performed, and patient medications and                            allergies were reviewed. The patient's tolerance of                            previous anesthesia was also reviewed. The risks                            and benefits of the procedure and the sedation                            options and risks were discussed with the patient.                            All questions were answered, and informed consent                            was obtained. Prior Anticoagulants: The patient has                            taken no previous anticoagulant or antiplatelet                            agents. ASA Grade Assessment: II - A patient with                            mild systemic disease. After reviewing the risks                            and benefits, the patient was deemed in                            satisfactory condition to undergo the procedure.                           After obtaining informed consent, the colonoscope  was passed under direct vision. Throughout the                            procedure, the patient's blood pressure, pulse, and                            oxygen saturations were monitored continuously. The                            Colonoscope was introduced through the anus and                            advanced to the the terminal ileum, with                            identification of the  appendiceal orifice and IC                            valve. The terminal ileum, ileocecal valve,                            appendiceal orifice, and rectum were photographed.                            The quality of the bowel preparation was excellent. Scope In: 4:02:55 PM Scope Out: 4:21:04 PM Scope Withdrawal Time: 0 hours 13 minutes 46 seconds  Total Procedure Duration: 0 hours 18 minutes 9 seconds  Findings:                 The perianal and digital rectal examinations were                            normal.                           Three sessile polyps were found in the transverse                            colon and cecum. The polyps were 7 to 11 mm in                            size. These polyps were removed with a cold snare.                            Resection and retrieval were complete.                           Three pedunculated polyps were found in the rectum,                            sigmoid colon and transverse colon. The polyps were                            9 to 15 mm in size.  These polyps were removed with                            a hot snare. Resection and retrieval were complete.                           Non-bleeding internal hemorrhoids were found during                            retroflexion. Complications:            No immediate complications. Estimated blood loss:                            None. Estimated Blood Loss:     Estimated blood loss was minimal. Impression:               - Three 7 to 11 mm polyps in the transverse colon                            and in the cecum, removed with a cold snare.                            Resected and retrieved.                           - Three 9 to 15 mm polyps in the rectum, in the                            sigmoid colon and in the transverse colon, removed                            with a hot snare. Resected and retrieved.                           - Non-bleeding internal hemorrhoids. Recommendation:            - Patient has a contact number available for                            emergencies. The signs and symptoms of potential                            delayed complications were discussed with the                            patient. Return to normal activities tomorrow.                            Written discharge instructions were provided to the                            patient.                           - Resume previous diet.                           -  Continue present medications.                           - Repeat colonoscopy in 3 years for surveillance                            based on pathology results.                           - Await pathology results. Mauri Pole, MD 08/29/2018 4:29:43 PM This report has been signed electronically.

## 2018-08-29 NOTE — Patient Instructions (Signed)
Information on polyps and hemorrhoids given to you today.  Await pathology results.  YOU HAD AN ENDOSCOPIC PROCEDURE TODAY AT THE [facility]:   Refer to the procedure report that was given to you for any specific questions about what was found during the examination.  If the procedure report does not answer your questions, please call your gastroenterologist to clarify.  If you requested that your care partner not be given the details of your procedure findings, then the procedure report has been included in a sealed envelope for you to review at your convenience later.  YOU SHOULD EXPECT: Some feelings of bloating in the abdomen. Passage of more gas than usual.  Walking can help get rid of the air that was put into your GI tract during the procedure and reduce the bloating. If you had a lower endoscopy (such as a colonoscopy or flexible sigmoidoscopy) you may notice spotting of blood in your stool or on the toilet paper. If you underwent a bowel prep for your procedure, you may not have a normal bowel movement for a few days.  Please Note:  You might notice some irritation and congestion in your nose or some drainage.  This is from the oxygen used during your procedure.  There is no need for concern and it should clear up in a day or so.  SYMPTOMS TO REPORT IMMEDIATELY:   Following lower endoscopy (colonoscopy or flexible sigmoidoscopy):  Excessive amounts of blood in the stool  Significant tenderness or worsening of abdominal pains  Swelling of the abdomen that is new, acute  Fever of 100F or higher    For urgent or emergent issues, a gastroenterologist can be reached at any hour by calling [phone].   DIET:  We do recommend a small meal at first, but then you may proceed to your regular diet.  Drink plenty of fluids but you should avoid alcoholic beverages for 24 hours.  ACTIVITY:  You should plan to take it easy for the rest of today and you should NOT DRIVE or use  heavy machinery until tomorrow (because of the sedation medicines used during the test).    FOLLOW UP: Our staff will call the number listed on your records the next business day following your procedure to check on you and address any questions or concerns that you may have regarding the information given to you following your procedure. If we do not reach you, we will leave a message.  However, if you are feeling well and you are not experiencing any problems, there is no need to return our call.  We will assume that you have returned to your regular daily activities without incident. We will be calling you two weeks following your procedure to see if you have developed any symptoms of the COVID-19.  If you develop any symptoms before then, please let us know.  If any biopsies were taken you will be contacted by phone or by letter within the next 1-3 weeks.  Please call us at [phone] if you have not heard about the biopsies in 3 weeks.    SIGNATURES/CONFIDENTIALITY: You and/or your care partner have signed paperwork which will be entered into your electronic medical record.  These signatures attest to the fact that that the information above on your After Visit Summary has been reviewed and is understood.  Full responsibility of the confidentiality of this discharge information lies with you and/or your care-partner. 

## 2018-08-29 NOTE — Progress Notes (Signed)
Called to room to assist during endoscopic procedure.  Patient ID and intended procedure confirmed with present staff. Received instructions for my participation in the procedure from the performing physician.  

## 2018-08-30 ENCOUNTER — Telehealth: Payer: Self-pay | Admitting: *Deleted

## 2018-08-30 NOTE — Telephone Encounter (Signed)
  Follow up Call-  Call back number 08/29/2018  Post procedure Call Back phone  # [phone]  Permission to leave phone message Yes  Some recent data might be hidden     Patient questions:  Do you have a fever, pain , or abdominal swelling? No. Pain Score  0 *  Have you tolerated food without any problems? Yes.    Have you been able to return to your normal activities? Yes.    Do you have any questions about your discharge instructions: Diet   No. Medications  No. Follow up visit  No.  Do you have questions or concerns about your Care? No.  Actions: * If pain score is 4 or above: No action needed, pain <4.

## 2018-08-31 ENCOUNTER — Telehealth: Payer: 59 | Admitting: Nurse Practitioner

## 2018-08-31 ENCOUNTER — Telehealth: Payer: Self-pay | Admitting: *Deleted

## 2018-08-31 DIAGNOSIS — J301 Allergic rhinitis due to pollen: Secondary | ICD-10-CM | POA: Diagnosis not present

## 2018-08-31 NOTE — Progress Notes (Signed)
E visit for Allergic Rhinitis We are sorry that you are not feeling well.  Here is how we plan to help!  Based on what you have shared with me it looks like you have Allergic Rhinitis.  Rhinitis is when a reaction occurs that causes nasal congestion, runny nose, sneezing, and itching.  Most types of rhinitis are caused by an inflammation and are associated with symptoms in the eyes ears or throat. There are several types of rhinitis.  The most common are acute rhinitis, which is usually caused by a viral illness, allergic or seasonal rhinitis, and nonallergic or year-round rhinitis.  Nasal allergies occur certain times of the year.  Allergic rhinitis is caused when allergens in the air trigger the release of histamine in the body.  Histamine causes itching, swelling, and fluid to build up in the fragile linings of the nasal passages, sinuses and eyelids.  An itchy nose and clear discharge are common.  I recommend the following over the counter treatments: Allegra 60 mg twice daily  I also would recommend a nasal spray: Flonase 2 sprays into each nostril once daily  You may also benefit from eye drops such as: Systane 1-2 driops each eye twice daily as needed  HOME CARE:   You can use an over-the-counter saline nasal spray as needed  Avoid areas where there is heavy dust, mites, or molds  Stay indoors on windy days during the pollen season  Keep windows closed in home, at least in bedroom; use air conditioner.  Use high-efficiency house air filter  Keep windows closed in car, turn AC on re-circulate  Avoid playing out with dog during pollen season  GET HELP RIGHT AWAY IF:   If your symptoms do not improve within 10 days  You become short of breath  You develop yellow or green discharge from your nose for over 3 days  You have coughing fits  MAKE SURE YOU:   Understand these instructions  Will watch your condition  Will get help right away if you are not doing well or  get worse  Thank you for choosing an e-visit. Your e-visit answers were reviewed by a board certified advanced clinical practitioner to complete your personal care plan. Depending upon the condition, your plan could have included both over the counter or prescription medications. Please review your pharmacy choice. Be sure that the pharmacy you have chosen is open so that you can pick up your prescription now.  If there is a problem you may message your provider in [place] to have the prescription routed to another pharmacy. Your safety is important to [place]. If you have drug allergies check your prescription carefully.  For the next 24 hours, you can use MyChart to ask questions about today's visit, request a non-urgent call back, or ask for a work or school excuse from your e-visit provider. You will get an email in the next two days asking about your experience. I hope that your e-visit has been valuable and will speed your recovery.   5-10 minutes spent reviewing and documenting in chart.

## 2018-08-31 NOTE — Telephone Encounter (Signed)
1. Have you developed a fever since your procedure? No.  2.   Have you had an respiratory symptoms (SOB or cough) since your procedure? No.  3.   Have you tested positive for COVID 19 since your procedure No.  3.   Have you had any family members/close contacts diagnosed with the COVID 19 since your procedure?  No   If any of these questions are a yes, please inquire if patient has been seen by family doctor and route this note to Jeannette Maddy Walton, RN. 

## 2018-09-14 ENCOUNTER — Encounter: Payer: Self-pay | Admitting: Gastroenterology

## 2018-09-16 ENCOUNTER — Encounter: Payer: Self-pay | Admitting: Family Medicine

## 2018-09-16 ENCOUNTER — Ambulatory Visit (INDEPENDENT_AMBULATORY_CARE_PROVIDER_SITE_OTHER): Payer: 59 | Admitting: Family Medicine

## 2018-09-16 DIAGNOSIS — R109 Unspecified abdominal pain: Secondary | ICD-10-CM

## 2018-09-16 DIAGNOSIS — J301 Allergic rhinitis due to pollen: Secondary | ICD-10-CM

## 2018-09-16 DIAGNOSIS — R1032 Left lower quadrant pain: Secondary | ICD-10-CM

## 2018-09-16 DIAGNOSIS — H1013 Acute atopic conjunctivitis, bilateral: Secondary | ICD-10-CM

## 2018-09-16 DIAGNOSIS — H1010 Acute atopic conjunctivitis, unspecified eye: Secondary | ICD-10-CM | POA: Insufficient documentation

## 2018-09-16 MED ORDER — OLOPATADINE HCL 0.2 % OP SOLN: 1.0000 [drp] | Freq: Every day | OPHTHALMIC | 1 refills | Status: DC

## 2018-09-16 MED ORDER — CIPROFLOXACIN-FLUOCINOLONE PF 0.3-0.025 % OT SOLN: 0.2500 mL | Freq: Two times a day (BID) | OTIC | 0 refills | Status: DC

## 2018-09-16 NOTE — Assessment & Plan Note (Signed)
S/p GI w/u with reassuring colonoscopy  Also neg CT abd/pelvis  Also neg hip xr Suggested she f/u with her gyn doctor(will let us know if she needs a referral)

## 2018-09-16 NOTE — Progress Notes (Signed)
Virtual Visit via Video Note  I connected with Felicia Robbins on 09/16/18 at  3:15 PM EDT by a video enabled telemedicine application and verified that I am speaking with the correct person using two identifiers.  Location: Patient: home Provider: office   We lost sound but retained video ability     I discussed the limitations of evaluation and management by telemedicine and the availability of in person appointments. The patient expressed understanding and agreed to proceed.  History of Present Illness: Pt presents with several issues   She had virtual visit for rhinitis- thought it was allergies  Then woke up one day- ? Pink eye in L and then R  Used eye drops for allergies and an antihistamine -allegra, flonase systane for eyes   Now having cough- from ? Drainage / some production mostly - clear  No fever  Not feeling sick No facial pain or pressure  No colored mucous  Throat is sore from drainage   This am her L eye was closed again - with gunk  Now other eye is water  Mostly itchy and grainy feeling  She uses systane-that helped     Still having problems with pain in L lower abd/pelvic area  So bad on sat she had trouble walking  Has had GI w/u -colonoscopy  Had normal xray of hip  Still hurting?   Has tried to stop dairy to see if it helps    Also needs refill of otovel (cipro and fluocinolone) -uses bid for a week prn OE in affected ear  From ear doctor -every 6 mo  Closed temporarily while moving   Review of Systems  Constitutional: Negative for chills, fever, malaise/fatigue and weight loss.  HENT: Positive for congestion and hearing loss. Negative for ear discharge and sinus pain.        Intermittent ear pain (baseline OE on and off)  Eyes: Positive for discharge. Negative for blurred vision.  Respiratory: Positive for cough and sputum production. Negative for shortness of breath, wheezing and stridor.   Cardiovascular: Negative for chest pain and  palpitations.  Gastrointestinal: Negative for abdominal pain, heartburn and nausea.  Musculoskeletal: Negative for myalgias.  Skin: Negative for rash.  Neurological: Negative for dizziness and headaches.      Patient Active Problem List   Diagnosis Date Noted  . Allergic conjunctivitis 09/16/2018  . Left groin pain 08/17/2018  . IBS (irritable bowel syndrome) 08/17/2018  . Left sided abdominal pain 06/27/2018  . Constipation 06/27/2018  . Colon cancer screening 03/21/2018  . Pulmonary nodules 03/21/2018  . Osteopenia 03/21/2018  . LBBB (left bundle branch block) 01/08/2018  . Chest pain 12/17/2017  . Palpitations 12/17/2017  . History of Wolff-Parkinson-White (WPW) syndrome 12/17/2017  . Hyperlipidemia 03/17/2017  . Elevated glucose level 03/17/2017  . Obesity 02/11/2016  . Lactose intolerance 02/11/2016  . Stress reaction, emotional 01/19/2012  . Gynecologic exam normal 12/08/2010  . Routine general medical examination at a health care facility 11/29/2010  . Hot flashes 08/11/2010  . Allergic rhinitis 08/15/2007  . FIBROCYSTIC BREAST DISEASE 09/24/2006  . HX, PERSONAL, COLONIC POLYPS 09/24/2006  . MIGRAINE, COMMON 08/25/2006  . DECREASED HEARING 08/25/2006  . Essential hypertension 08/25/2006  . Esophageal reflux 08/25/2006  . MIGRAINES, HX OF 08/25/2006   Past Medical History:  Diagnosis Date  . Depression   . GERD (gastroesophageal reflux disease)   . Headache(784.0)   . 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 Left bundle  branch block   . WPW (Wolff-Parkinson-White syndrome)    See scanned history note   Past Surgical History:  Procedure Laterality Date  . BIOPSY BOWEL     small bowel biopsy- normal (colon plyps / colonoscopy 03/1999)  . BREAST BIOPSY Left 07/13/2013   Procedure: BREAST BIOPSY WITH NEEDLE LOCALIZATION;  Surgeon: Marcello Moores A. Cornett, MD;  Location: [facility];  Service: General;  Laterality: Left;  . BREAST SURGERY     Lt  breast nodule - neg.  . CHOLECYSTECTOMY  2005   lap choli  . INNER EAR SURGERY  2011   Multiple surgeries Lt ear. Hearing device implanted Lt ear 2011.  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 TUBAL LIGATION     Social History   Tobacco Use  . Smoking status: Former Smoker    Packs/day: 1.00    Years: 15.00    Pack years: 15.00    Types: Cigarettes    Last attempt to quit: 04/20/1986    Years since quitting: 32.4  . Smokeless tobacco: Never Used  Substance Use Topics  . Alcohol use: Yes    Alcohol/week: 0.0 standard drinks    Comment: 2-3 drinks a week  . Drug use: No   Family History  Problem Relation Age of Onset  . Cancer Mother        breast  . Diabetes Mother   . Stroke Mother   . Multiple sclerosis Mother   . Crohn's disease Brother   . Cancer Sister 50       breast  . Diabetes Brother   . Colon cancer Neg Hx   . Liver cancer Neg Hx   . Rectal cancer Neg Hx   . Stomach cancer Neg Hx    Allergies  Allergen Reactions  . Tetracycline Other (See Comments)    REACTION: rash, passed out REACTION:  passed out   Current Outpatient Medications on File Prior to Visit  Medication Sig Dispense Refill  . ibuprofen (ADVIL) 200 MG tablet Take 400 mg by mouth every 6 (six) hours as needed (as needed for pain).    Marland Kitchen tiZANidine (ZANAFLEX) 4 MG tablet Take 0.5-1 tablets (2-4 mg total) by mouth 4 (four) times daily as needed for muscle spasms. And headache 30 tablet 11   No current facility-administered medications on file prior to visit.     Observations/Objective Pt sounded well /like her usual self Not hoarse  Not coughing or sob  Able to hear call well Good mood Nl cognition/ no confusion/answers questions appropriately  Assessment and Plan: Problem List Items Addressed This Visit      Respiratory   Allergic rhinitis    Allergic rhinitis/seasonal with pnd and rhinorrhea (clear) and cough in ams No fever/facial pain or signs of infection  allergra is  helping inst to continue this with flonase  Also given pataday for conjunctivitis  Update if not starting to improve in a week or if worsening          Other   Left sided abdominal pain    Neg GI w/u  Neg CT and EGD Also neg XR of hip  Pain now more in pelvis She will see her gyn and alert [place] if she needs a referral      Left groin pain    S/p GI w/u with reassuring colonoscopy  Also neg CT abd/pelvis  Also neg hip xr Suggested she f/u with her gyn doctor(will let us know  if she needs a referral)        Allergic conjunctivitis - Primary    Itchy/grainy sensation in eyes on and off with occasional redness (no redness today)  D/c is clear  systane eye drops otc helped a little Will try pataday once daily and see if this works better Disc s/s of viral/bacterial conjunctivitis to watch for as well  Allergen avoidance suggested           Follow Up Instructions: Check in with your gyn regarding the pelvic pain   I sent in your ear solution to get by until your ENT opens again   Try the pataday generic eye drops and let us know if no improvement Continue allegra and flonase Watch for worse cough or any fever and let us know    I discussed the assessment and treatment plan with the patient. The patient was provided an opportunity to ask questions and all were answered. The patient agreed with the plan and demonstrated an understanding of the instructions.   The patient was advised to call back or seek an in-person evaluation if the symptoms worsen or if the condition fails to improve as anticipated.  I provided 23 minutes of non-face-to-face time during this encounter.   Loura Pardon, MD

## 2018-09-16 NOTE — Patient Instructions (Signed)
Check in with your gyn regarding the pelvic pain   I sent in your ear solution to get by until your ENT opens again   Try the pataday generic eye drops and let us know if no improvement Continue allegra and flonase Watch for worse cough or any fever and let us know

## 2018-09-16 NOTE — Assessment & Plan Note (Signed)
Itchy/grainy sensation in eyes on and off with occasional redness (no redness today)  D/c is clear  systane eye drops otc helped a little Will try pataday once daily and see if this works better Disc s/s of viral/bacterial conjunctivitis to watch for as well  Allergen avoidance suggested

## 2018-09-16 NOTE — Assessment & Plan Note (Signed)
Allergic rhinitis/seasonal with pnd and rhinorrhea (clear) and cough in ams No fever/facial pain or signs of infection  allergra is helping inst to continue this with flonase  Also given pataday for conjunctivitis  Update if not starting to improve in a week or if worsening

## 2018-09-18 NOTE — Assessment & Plan Note (Signed)
Neg GI w/u  Neg CT and EGD Also neg XR of hip  Pain now more in pelvis She will see her gyn and alert [place] if she needs a referral

## 2019-02-10 ENCOUNTER — Ambulatory Visit (HOSPITAL_COMMUNITY)
Admission: EM | Admit: 2019-02-10 | Discharge: 2019-02-10 | Disposition: A | Discharge status: Home / Self Care | Payer: 59 | Attending: Family Medicine | Admitting: Family Medicine

## 2019-02-10 ENCOUNTER — Telehealth: Payer: 59

## 2019-02-10 ENCOUNTER — Ambulatory Visit (INDEPENDENT_AMBULATORY_CARE_PROVIDER_SITE_OTHER): Payer: 59

## 2019-02-10 ENCOUNTER — Encounter (HOSPITAL_COMMUNITY): Payer: Self-pay

## 2019-02-10 ENCOUNTER — Other Ambulatory Visit: Payer: Self-pay

## 2019-02-10 DIAGNOSIS — Z20828 Contact with and (suspected) exposure to other viral communicable diseases: Secondary | ICD-10-CM | POA: Diagnosis not present

## 2019-02-10 DIAGNOSIS — R059 Cough, unspecified: Secondary | ICD-10-CM

## 2019-02-10 DIAGNOSIS — K219 Gastro-esophageal reflux disease without esophagitis: Secondary | ICD-10-CM

## 2019-02-10 DIAGNOSIS — J0100 Acute maxillary sinusitis, unspecified: Secondary | ICD-10-CM

## 2019-02-10 DIAGNOSIS — Z1159 Encounter for screening for other viral diseases: Secondary | ICD-10-CM

## 2019-02-10 DIAGNOSIS — R05 Cough: Secondary | ICD-10-CM | POA: Insufficient documentation

## 2019-02-10 DIAGNOSIS — R519 Headache, unspecified: Secondary | ICD-10-CM | POA: Diagnosis present

## 2019-02-10 MED ORDER — KETOROLAC TROMETHAMINE 30 MG/ML IJ SOLN
INTRAMUSCULAR | Status: AC
  Filled 2019-02-10: qty 1

## 2019-02-10 MED ORDER — ALUM & MAG HYDROXIDE-SIMETH 200-200-20 MG/5ML PO SUSP
30.0000 mL | Freq: Once | ORAL | Status: AC
  Administered 2019-02-10: 13:00:00 30 mL via ORAL

## 2019-02-10 MED ORDER — KETOROLAC TROMETHAMINE 30 MG/ML IJ SOLN
30.0000 mg | Freq: Once | INTRAMUSCULAR | Status: AC
  Administered 2019-02-10: 13:00:00 30 mg via INTRAMUSCULAR

## 2019-02-10 MED ORDER — OMEPRAZOLE 20 MG PO CPDR: 20.0000 mg | DELAYED_RELEASE_CAPSULE | Freq: Every day | ORAL | 0 refills | Status: DC

## 2019-02-10 MED ORDER — AMOXICILLIN-POT CLAVULANATE 875-125 MG PO TABS: 1.0000 | ORAL_TABLET | Freq: Two times a day (BID) | ORAL | 0 refills | Status: AC

## 2019-02-10 MED ORDER — ALBUTEROL SULFATE HFA 108 (90 BASE) MCG/ACT IN AERS: 1.0000 | INHALATION_SPRAY | Freq: Four times a day (QID) | RESPIRATORY_TRACT | 0 refills | Status: DC | PRN

## 2019-02-10 MED ORDER — LIDOCAINE VISCOUS HCL 2 % MT SOLN
OROMUCOSAL | Status: AC
  Filled 2019-02-10: qty 15

## 2019-02-10 MED ORDER — ALUM & MAG HYDROXIDE-SIMETH 200-200-20 MG/5ML PO SUSP
ORAL | Status: AC
  Filled 2019-02-10: qty 30

## 2019-02-10 MED ORDER — LIDOCAINE VISCOUS HCL 2 % MT SOLN
15.0000 mL | Freq: Once | OROMUCOSAL | Status: AC
  Administered 2019-02-10: 13:00:00 15 mL via ORAL

## 2019-02-10 NOTE — Discharge Instructions (Addendum)
Your chest xray and EKG are well today ( no changes to your EKG from your previous EKG).  I do think this is still likely viral in nature, continue with fluids, daily flonase, tylenol or ibuprofen as needed. Don't take additional ibuprofen for another 8 hours If no improvement or if any worsening of sinus symptoms on Monday you may take course of antibiotics for sinus infection.  Please start daily reflux medication prilosec.  Self isolate until covid results are back and negative.  Will notify you of any positive findings. You may monitor your results on your MyChart online as well.    If symptoms worsen or do not improve in the next week to return to be seen or to follow up with your PCP.

## 2019-02-10 NOTE — ED Triage Notes (Signed)
Pt presents with chills, ongoing headache, productive cough, and chest tightness.  Pt states she has had  Hx acid indigestion and migraines.

## 2019-02-10 NOTE — ED Provider Notes (Signed)
[facility]    CSN: [id number] Arrival date & time: 02/10/19  1124      History   Chief Complaint Chief Complaint  Patient presents with  . Headache  . Chest Tightness  . Cough    HPI Felicia Robbins is a 63 y.o. female.   Felicia Robbins presents with complaints of headache. Started approximately 10/18 with headache. Woke the next morning with sinus pain and dental pain. Used flonase. Headache has mildly improved today for the first time. Pain to eyes still. Has had indigestion for the past 4 days on and off. Chest tightness yesterday. Today with left sided chest pain, soreness and tightness. Upper back feels sore. Cough is dry, occasionally phlegm production. No nasal drainage. Congestion. No new shortness of breath. Hasn't taken any medication for indigestion, this is not typical for her. History of headaches, but typically doesn't last this long. Headache feels similar. No dizzy. No loss of taste or smell. Mild nausea yesterday morning. Has had sweating and chills, doesn't have a thermometer to check for temperature. Patients daughter had similar indigestion symptoms, as well as a friend of hers had similar indigestion symptoms. Doesn't work outside the home.   Per chart review history of gerd, headache, depression, LBBB, WPW.    ROS per HPI, negative if not otherwise mentioned.      Past Medical History:  Diagnosis Date  . Depression   . GERD (gastroesophageal reflux disease)   . Headache(784.0)   . 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 Left bundle branch block   . WPW (Wolff-Parkinson-White syndrome)    See scanned history note    Patient Active Problem List   Diagnosis Date Noted  . Allergic conjunctivitis 09/16/2018  . Left groin pain 08/17/2018  . IBS (irritable bowel syndrome) 08/17/2018  . Left sided abdominal pain 06/27/2018  . Constipation 06/27/2018  . Colon cancer screening 03/21/2018  . Pulmonary nodules 03/21/2018  .  Osteopenia 03/21/2018  . LBBB (left bundle branch block) 01/08/2018  . Chest pain 12/17/2017  . Palpitations 12/17/2017  . History of Wolff-Parkinson-White (WPW) syndrome 12/17/2017  . Hyperlipidemia 03/17/2017  . Elevated glucose level 03/17/2017  . Obesity 02/11/2016  . Lactose intolerance 02/11/2016  . Stress reaction, emotional 01/19/2012  . Gynecologic exam normal 12/08/2010  . Routine general medical examination at a health care facility 11/29/2010  . Hot flashes 08/11/2010  . Allergic rhinitis 08/15/2007  . FIBROCYSTIC BREAST DISEASE 09/24/2006  . HX, PERSONAL, COLONIC POLYPS 09/24/2006  . MIGRAINE, COMMON 08/25/2006  . DECREASED HEARING 08/25/2006  . Essential hypertension 08/25/2006  . Esophageal reflux 08/25/2006  . MIGRAINES, HX OF 08/25/2006    Past Surgical History:  Procedure Laterality Date  . BIOPSY BOWEL     small bowel biopsy- normal (colon plyps / colonoscopy 03/1999)  . BREAST BIOPSY Left 07/13/2013   Procedure: BREAST BIOPSY WITH NEEDLE LOCALIZATION;  Surgeon: Marcello Moores A. Cornett, MD;  Location: [facility];  Service: General;  Laterality: Left;  . BREAST SURGERY     Lt breast nodule - neg.  . CHOLECYSTECTOMY  2005   lap choli  . INNER EAR SURGERY  2011   Multiple surgeries Lt ear. Hearing device implanted Lt ear 2011.  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 TUBAL LIGATION      OB History   No obstetric history on file.      Home Medications    Prior to  Admission medications   Medication Sig Start Date End Date Taking? Authorizing Provider  albuterol (PROAIR HFA) 108 (90 Base) MCG/ACT inhaler Inhale 1-2 puffs into the lungs every 6 (six) hours as needed for wheezing or shortness of breath. 02/10/19   Zigmund Gottron, NP  amoxicillin-clavulanate (AUGMENTIN) 875-125 MG tablet Take 1 tablet by mouth every 12 (twelve) hours for 10 days. 02/13/19 02/23/19  Zigmund Gottron, NP  ciprofloxacin-fluocinolone PF (OTOVEL) 0.3-0.025 %  SOLN Place 0.25 mLs into the left ear 2 (two) times daily. 09/16/18   Tower, Wynelle Fanny, MD  ibuprofen (ADVIL) 200 MG tablet Take 400 mg by mouth every 6 (six) hours as needed (as needed for pain).    [provider]  Olopatadine HCl 0.2 % SOLN Apply 1 drop to eye daily. For allergic eye symptoms 09/16/18   Tower, Wynelle Fanny, MD  omeprazole (PRILOSEC) 20 MG capsule Take 1 capsule (20 mg total) by mouth daily. 02/10/19   Zigmund Gottron, NP  tiZANidine (ZANAFLEX) 4 MG tablet Take 0.5-1 tablets (2-4 mg total) by mouth 4 (four) times daily as needed for muscle spasms. And headache 03/22/18   Tower, Wynelle Fanny, MD    Family History Family History  Problem Relation Age of Onset  . Cancer Mother        breast  . Diabetes Mother   . Stroke Mother   . Multiple sclerosis Mother   . Crohn's disease Brother   . Cancer Sister 54       breast  . Diabetes Brother   . Colon cancer Neg Hx   . Liver cancer Neg Hx   . Rectal cancer Neg Hx   . Stomach cancer Neg Hx     Social History Social History   Tobacco Use  . Smoking status: Former Smoker    Packs/day: 1.00    Years: 15.00    Pack years: 15.00    Types: Cigarettes    Quit date: 04/20/1986    Years since quitting: 32.8  . Smokeless tobacco: Never Used  Substance Use Topics  . Alcohol use: Yes    Alcohol/week: 0.0 standard drinks    Comment: 2-3 drinks a week  . Drug use: No     Allergies   Tetracycline   Review of Systems Review of Systems   Physical Exam Triage Vital Signs ED Triage Vitals  Enc Vitals Group     BP 02/10/19 1155 (!) 154/78     Pulse Rate 02/10/19 1155 70     Resp 02/10/19 1155 17     Temp 02/10/19 1155 98.5 F (36.9 C)     Temp Source 02/10/19 1155 Oral     SpO2 02/10/19 1155 95 %     Weight --      Height --      Head Circumference --      Peak Flow --      Pain Score 02/10/19 1200 5     Pain Loc --      Pain Edu? --      Excl. in [place]? --    No data found.  Updated Vital Signs BP (!) 154/78  (BP Location: Right Arm)   Pulse 70   Temp 98.5 F (36.9 C) (Oral)   Resp 17   LMP 11/07/2010   SpO2 95%    Physical Exam Constitutional:      General: She is not in acute distress.    Appearance: She is well-developed.  Cardiovascular:  Rate and Rhythm: Normal rate and regular rhythm.     Heart sounds: Normal heart sounds.  Pulmonary:     Effort: Pulmonary effort is normal.     Breath sounds: Normal breath sounds.  Skin:    General: Skin is warm and dry.  Neurological:     Mental Status: She is alert and oriented to person, place, and time.    EKG:  NSR rate of 65 with apparent WPW . Previous EKG was available for review. No stwave changes as interpreted by me.    UC Treatments / Results  Labs (all labs ordered are listed, but only abnormal results are displayed) Labs Reviewed  NOVEL CORONAVIRUS, NAA (HOSP ORDER, SEND-OUT TO REF LAB; TAT 18-24 HRS)    EKG   Radiology Dg Chest 2 View  Result Date: 02/10/2019 CLINICAL DATA:  Chest pain, cough. EXAM: CHEST - 2 VIEW COMPARISON:  March 17, 2016. FINDINGS: The heart size and mediastinal contours are within normal limits. Both lungs are clear. No pneumothorax or pleural effusion is noted. The visualized skeletal structures are unremarkable. IMPRESSION: No active cardiopulmonary disease. Electronically Signed   By: Marijo Conception M.D.   On: 02/10/2019 12:49    Procedures Procedures (including critical care time)  Medications Ordered in UC Medications  alum & mag hydroxide-simeth (MAALOX/MYLANTA) 200-200-20 MG/5ML suspension 30 mL (30 mLs Oral Given 02/10/19 1256)    And  lidocaine (XYLOCAINE) 2 % viscous mouth solution 15 mL (15 mLs Oral Given 02/10/19 1257)  ketorolac (TORADOL) 30 MG/ML injection 30 mg (30 mg Intramuscular Given 02/10/19 1256)  ketorolac (TORADOL) 30 MG/ML injection (has no administration in time range)  alum & mag hydroxide-simeth (MAALOX/MYLANTA) 200-200-20 MG/5ML suspension (has no  administration in time range)  lidocaine (XYLOCAINE) 2 % viscous mouth solution (has no administration in time range)    Initial Impression / Assessment and Plan / UC Course  I have reviewed the triage vital signs and the nursing notes.  Pertinent labs & imaging results that were available during my care of the patient were reviewed by me and considered in my medical decision making (see chart for details).     ekg and chest xray without acute findings. Patient feels better with toradol and gi cocktail here in clinic. History and physical consistent with viral illness.  Antibiotics provided for sinusitis to be taken next week if needed, discussed with patient. Daily omeprazole recommended. covid pending. Return precautions provided. Patient verbalized understanding and agreeable to plan.   Final Clinical Impressions(s) / UC Diagnoses   Final diagnoses:  Acute maxillary sinusitis, recurrence not specified  Cough  Gastroesophageal reflux disease, unspecified whether esophagitis present     Discharge Instructions     Your chest xray and EKG are well today ( no changes to your EKG from your previous EKG).  I do think this is still likely viral in nature, continue with fluids, daily flonase, tylenol or ibuprofen as needed. Don't take additional ibuprofen for another 8 hours If no improvement or if any worsening of sinus symptoms on Monday you may take course of antibiotics for sinus infection.  Please start daily reflux medication prilosec.  Self isolate until covid results are back and negative.  Will notify you of any positive findings. You may monitor your results on your MyChart online as well.    If symptoms worsen or do not improve in the next week to return to be seen or to follow up with your PCP.  ED Prescriptions    Medication Sig Dispense Auth. Provider   amoxicillin-clavulanate (AUGMENTIN) 875-125 MG tablet Take 1 tablet by mouth every 12 (twelve) hours for 10 days. 20  tablet Augusto Gamble B, NP   albuterol (PROAIR HFA) 108 (90 Base) MCG/ACT inhaler Inhale 1-2 puffs into the lungs every 6 (six) hours as needed for wheezing or shortness of breath. 8 g Augusto Gamble B, NP   omeprazole (PRILOSEC) 20 MG capsule Take 1 capsule (20 mg total) by mouth daily. 30 capsule Augusto Gamble B, NP     I have reviewed the PDMP during this encounter.   Zigmund Gottron, NP 02/10/19 1334

## 2019-02-12 LAB — NOVEL CORONAVIRUS, NAA (HOSP ORDER, SEND-OUT TO REF LAB; TAT 18-24 HRS): SARS-CoV-2, NAA: NOT DETECTED

## 2019-03-01 ENCOUNTER — Ambulatory Visit (INDEPENDENT_AMBULATORY_CARE_PROVIDER_SITE_OTHER): Payer: 59 | Admitting: Family Medicine

## 2019-03-01 ENCOUNTER — Encounter: Payer: Self-pay | Admitting: Family Medicine

## 2019-03-01 ENCOUNTER — Other Ambulatory Visit: Payer: Self-pay

## 2019-03-01 VITALS — Temp 97.6°F

## 2019-03-01 DIAGNOSIS — J069 Acute upper respiratory infection, unspecified: Secondary | ICD-10-CM | POA: Diagnosis not present

## 2019-03-01 DIAGNOSIS — Z20822 Contact with and (suspected) exposure to covid-19: Secondary | ICD-10-CM

## 2019-03-01 MED ORDER — HYDROCOD POLST-CPM POLST ER 10-8 MG/5ML PO SUER: 5.0000 mL | Freq: Two times a day (BID) | ORAL | 0 refills | Status: DC | PRN

## 2019-03-01 NOTE — Patient Instructions (Signed)
Based on your symptoms, it looks like you have a virus.   Antibiotics are not need for a viral infection but the following will help:   1. Drink plenty of fluids 2. Get lots of rest  Sinus Congestion 1) Neti Pot (Saline rinse) -- 2 times day -- if tolerated 2) Flonase (Store Brand ok) - once daily 3) Over the counter congestion medications  Cough 1) Cough drops can be helpful 2) Nyquil (or nighttime cough medication) 3) Honey is proven to be one of the best cough medications  4) Cough medicine with Dextromethorphan can also be helpful 5) Albuterol every 4 hours for 24 hours  Sore Throat 1) Honey as above, cough drops 2) Ibuprofen or Aleve can be helpful 3) Salt water Gargles  If you develop fevers (Temperature >100.4), chills, worsening symptoms or symptoms lasting longer than 10 days return to clinic.

## 2019-03-01 NOTE — Progress Notes (Signed)
I connected with [name] on 03/01/19 at  8:40 AM EST by video and verified that I am speaking with the correct person using two identifiers.   I discussed the limitations, risks, security and privacy concerns of performing an evaluation and management service by video and the availability of in person appointments. I also discussed with the patient that there may be a patient responsible charge related to this service. The patient expressed understanding and agreed to proceed.  Patient location: Home Provider Location: [place] Participants: Felicia Robbins and LETIA TRUMM   Subjective:     Felicia Robbins is a 63 y.o. female presenting for Cough (x 2 days, constant. Started with a head cold. Sneezing. No fever. No headache. has been using Albuterol inhaler as of yesterday-02/28/2019)     Cough This is a new problem. The current episode started in the past 7 days. The problem has been gradually worsening. The problem occurs constantly. The cough is productive of sputum. Associated symptoms include nasal congestion, rhinorrhea, a sore throat and shortness of breath. Pertinent negatives include no ear pain, fever or headaches. Nothing aggravates the symptoms. She has tried a beta-agonist inhaler and OTC cough suppressant (mucinex, flonase) for the symptoms. The treatment provided mild relief.   Went to urgent care 1 month ago - diagnosed with sinus infection and started on inhaler Recovered from visit to urgent care This is a new cold Has used albuterol 3 times with this cold - it has helped  Sick contact: no Works from home Endorses loss of taste  Review of Systems  Constitutional: Negative for fever.  HENT: Positive for rhinorrhea and sore throat. Negative for ear pain.   Respiratory: Positive for cough and shortness of breath.   Neurological: Negative for headaches.     Social History   Tobacco Use  Smoking Status Former Smoker  . Packs/day:  1.00  . Years: 15.00  . Pack years: 15.00  . Types: Cigarettes  . Quit date: 04/20/1986  . Years since quitting: 32.8  Smokeless Tobacco Never Used        Objective:   BP Readings from Last 3 Encounters:  02/10/19 (!) 154/78  08/29/18 134/71  08/17/18 120/68   Wt Readings from Last 3 Encounters:  08/29/18 155 lb (70.3 kg)  08/25/18 155 lb (70.3 kg)  08/17/18 156 lb (70.8 kg)   Temp 97.6 F (36.4 C) Comment: per patient  LMP 11/07/2010    Physical Exam Constitutional:      Appearance: Normal appearance. She is not ill-appearing.  HENT:     Head: Normocephalic and atraumatic.     Right Ear: External ear normal.     Left Ear: External ear normal.     Nose: Congestion present.  Eyes:     Conjunctiva/sclera: Conjunctivae normal.  Pulmonary:     Effort: Pulmonary effort is normal. No respiratory distress.     Comments: Coughing frequently during visit Neurological:     Mental Status: She is alert. Mental status is at baseline.  Psychiatric:        Mood and Affect: Mood normal.        Behavior: Behavior normal.        Thought Content: Thought content normal.        Judgment: Judgment normal.             Assessment & Plan:   Problem List Items Addressed This Visit    None  Visit Diagnoses    Viral URI with cough    -  Primary   Relevant Medications   chlorpheniramine-HYDROcodone (TUSSIONEX PENNKINETIC ER) 10-8 MG/5ML SUER     Discussed OTC treatment for viral illness Given symptoms possible covid-19 and would recommend being tested ER precautions given Advised staying out of work and isolating until results have come back  Take albuterol up to every 4 hours for 24 hours Consider PFTs in the future    Return if symptoms worsen or fail to improve.  Felicia Noe, MD

## 2019-03-03 LAB — NOVEL CORONAVIRUS, NAA: SARS-CoV-2, NAA: NOT DETECTED

## 2019-04-07 ENCOUNTER — Other Ambulatory Visit: Payer: Self-pay

## 2019-04-07 ENCOUNTER — Encounter: Payer: Self-pay | Admitting: Family Medicine

## 2019-04-07 ENCOUNTER — Ambulatory Visit: Payer: 59 | Admitting: Family Medicine

## 2019-04-07 VITALS — BP 148/84 | Temp 97.3°F | Wt 163.0 lb

## 2019-04-07 DIAGNOSIS — I10 Essential (primary) hypertension: Secondary | ICD-10-CM

## 2019-04-07 DIAGNOSIS — R918 Other nonspecific abnormal finding of lung field: Secondary | ICD-10-CM

## 2019-04-07 MED ORDER — TIZANIDINE HCL 4 MG PO TABS: 2.0000 mg | ORAL_TABLET | Freq: Four times a day (QID) | ORAL | 11 refills | Status: DC | PRN

## 2019-04-07 MED ORDER — METOPROLOL SUCCINATE ER 50 MG PO TB24: 50.0000 mg | ORAL_TABLET | Freq: Every day | ORAL | 3 refills | Status: DC

## 2019-04-07 NOTE — Patient Instructions (Signed)
Start metoprolol xl 50 mg once daily  If any side effects or problems let me know  It will slow down heart rate a bit  If dizzy - let us know   Follow up in about a month  If you want to check BP at home-do it when relaxed with an arm cuff

## 2019-04-07 NOTE — Progress Notes (Signed)
Subjective:    Patient ID: Felicia Robbins, female    DOB: 1956-04-02, 63 y.o.   MRN: [id number]  This visit occurred during the SARS-CoV-2 public health emergency.  Safety protocols were in place, including screening questions prior to the visit, additional usage of staff PPE, and extensive cleaning of exam room while observing appropriate contact time as indicated for disinfecting solutions.    HPI Here for f/u of HTN   Pt went to gyn last week and told her BP was elevated  No symptoms   It was [id number] systolic last week   Also needs refill of zanaflex   Wt Readings from Last 3 Encounters:  04/07/19 163 lb (73.9 kg)  08/29/18 155 lb (70.3 kg)  08/25/18 155 lb (70.3 kg)   30.80 kg/m   BP Readings from Last 3 Encounters:  04/07/19 (!) 148/84  02/10/19 (!) 154/78  08/29/18 134/71   Pulse Readings from Last 3 Encounters:  02/10/19 70  08/29/18 70  08/17/18 83   She has had some more headaches lately  Thought it was from so much screen work  Working from home a lot now   Not moving as much   Some stress  Having pelvic floor issues -could not exercise for a while Went to PT for months  Now she has started to walk again   Diet is not good Eating wrong  More processed foods  Tries to avoid excessive sodium Picky eater - she does not like most foods    Has omeprazole prn gerd IBS has acted up lately also - diarrhea predominant    Has been on tenormin in the past for head aches   H/o LBBB has seen cardiology  Patient Active Problem List   Diagnosis Date Noted  . Allergic conjunctivitis 09/16/2018  . Left groin pain 08/17/2018  . IBS (irritable bowel syndrome) 08/17/2018  . Left sided abdominal pain 06/27/2018  . Constipation 06/27/2018  . Colon cancer screening 03/21/2018  . Lung nodules 03/21/2018  . Osteopenia 03/21/2018  . LBBB (left bundle branch block) 01/08/2018  . Chest pain 12/17/2017  . Palpitations 12/17/2017  . Hyperlipidemia 03/17/2017  .  Elevated glucose level 03/17/2017  . Obesity 02/11/2016  . Lactose intolerance 02/11/2016  . Stress reaction, emotional 01/19/2012  . Gynecologic exam normal 12/08/2010  . Routine general medical examination at a health care facility 11/29/2010  . Hot flashes 08/11/2010  . Allergic rhinitis 08/15/2007  . FIBROCYSTIC BREAST DISEASE 09/24/2006  . HX, PERSONAL, COLONIC POLYPS 09/24/2006  . MIGRAINE, COMMON 08/25/2006  . DECREASED HEARING 08/25/2006  . Essential hypertension 08/25/2006  . Esophageal reflux 08/25/2006  . MIGRAINES, HX OF 08/25/2006   Past Medical History:  Diagnosis Date  . Depression   . GERD (gastroesophageal reflux disease)   . Headache(784.0)   . 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 Left bundle branch block   . WPW (Wolff-Parkinson-White syndrome)    See scanned history note   Past Surgical History:  Procedure Laterality Date  . BIOPSY BOWEL     small bowel biopsy- normal (colon plyps / colonoscopy 03/1999)  . BREAST BIOPSY Left 07/13/2013   Procedure: BREAST BIOPSY WITH NEEDLE LOCALIZATION;  Surgeon: Marcello Moores A. Cornett, MD;  Location: [facility];  Service: General;  Laterality: Left;  . BREAST SURGERY     Lt breast nodule - neg.  . CHOLECYSTECTOMY  2005   lap choli  . INNER EAR SURGERY  2011  Multiple surgeries Lt ear. Hearing device implanted Lt ear 2011.  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 TUBAL LIGATION     Social History   Tobacco Use  . Smoking status: Former Smoker    Packs/day: 1.00    Years: 15.00    Pack years: 15.00    Types: Cigarettes    Quit date: 04/20/1986    Years since quitting: 32.9  . Smokeless tobacco: Never Used  Substance Use Topics  . Alcohol use: Yes    Alcohol/week: 0.0 standard drinks    Comment: 2-3 drinks a week  . Drug use: No   Family History  Problem Relation Age of Onset  . Cancer Mother        breast  . Diabetes Mother   . Stroke Mother   . Multiple sclerosis Mother   .  Crohn's disease Brother   . Cancer Sister 38       breast  . Diabetes Brother   . Colon cancer Neg Hx   . Liver cancer Neg Hx   . Rectal cancer Neg Hx   . Stomach cancer Neg Hx    Allergies  Allergen Reactions  . Tetracycline Other (See Comments)    REACTION: rash, passed out REACTION:  passed out   Current Outpatient Medications on File Prior to Visit  Medication Sig Dispense Refill  . albuterol (PROAIR HFA) 108 (90 Base) MCG/ACT inhaler Inhale 1-2 puffs into the lungs every 6 (six) hours as needed for wheezing or shortness of breath. 8 g 0  . ibuprofen (ADVIL) 200 MG tablet Take 400 mg by mouth every 6 (six) hours as needed (as needed for pain).    Marland Kitchen omeprazole (PRILOSEC) 20 MG capsule Take 1 capsule (20 mg total) by mouth daily. 30 capsule 0   No current facility-administered medications on file prior to visit.     Review of Systems  Constitutional: Negative for activity change, appetite change, fatigue, fever and unexpected weight change.  HENT: Negative for congestion, ear pain, rhinorrhea, sinus pressure and sore throat.   Eyes: Negative for pain, redness and visual disturbance.  Respiratory: Negative for cough, shortness of breath and wheezing.   Cardiovascular: Negative for chest pain and palpitations.  Gastrointestinal: Positive for diarrhea. Negative for abdominal pain, blood in stool and constipation.       IBS with abdominal cramping  Endocrine: Negative for polydipsia and polyuria.  Genitourinary: Negative for dysuria, frequency and urgency.  Musculoskeletal: Negative for arthralgias, back pain and myalgias.  Skin: Negative for pallor and rash.  Allergic/Immunologic: Negative for environmental allergies.  Neurological: Negative for dizziness, syncope and headaches.  Hematological: Negative for adenopathy. Does not bruise/bleed easily.  Psychiatric/Behavioral: Negative for decreased concentration and dysphoric mood. The patient is not nervous/anxious.        Feels  generally stressed        Objective:   Physical Exam Constitutional:      General: She is not in acute distress.    Appearance: She is well-developed.  HENT:     Head: Normocephalic and atraumatic.  Eyes:     Conjunctiva/sclera: Conjunctivae normal.     Pupils: Pupils are equal, round, and reactive to light.  Neck:     Thyroid: No thyromegaly.     Vascular: No carotid bruit or JVD.  Cardiovascular:     Rate and Rhythm: Normal rate and regular rhythm.     Heart sounds: Normal heart sounds. No gallop.   Pulmonary:  Effort: Pulmonary effort is normal. No respiratory distress.     Breath sounds: Normal breath sounds. No wheezing or rales.  Abdominal:     General: Bowel sounds are normal. There is no distension or abdominal bruit.     Palpations: Abdomen is soft. There is no mass.     Tenderness: There is abdominal tenderness.     Hernia: No hernia is present.     Comments: Mild epigastric tenderness  Musculoskeletal:     Cervical back: Normal range of motion and neck supple.  Lymphadenopathy:     Cervical: No cervical adenopathy.  Skin:    General: Skin is warm and dry.     Findings: No rash.  Neurological:     Mental Status: She is alert. Mental status is at baseline.     Coordination: Coordination normal.     Deep Tendon Reflexes: Reflexes are normal and symmetric. Reflexes normal.  Psychiatric:        Mood and Affect: Mood normal.           Assessment & Plan:   Problem List Items Addressed This Visit      Cardiovascular and Mediastinum   Essential hypertension - Primary    With bp trending up  BP: (!) 148/84    Disc lifestyle change incl dash eating plan /work on wt loss  Px metoprolol xl 50 mg daily Rev checking pulse  inst to watch for dizziness or other side effects and update [place]  F/u planned      Relevant Medications   metoprolol succinate (TOPROL-XL) 50 MG 24 hr tablet     Other   Lung nodules    Pt was supposed to f/u with CT in the  spring- but did not due to the pandemic  Will discuss this at f/u and plan when she feels comfortable

## 2019-04-09 NOTE — Assessment & Plan Note (Signed)
With bp trending up  BP: (!) 148/84    Disc lifestyle change incl dash eating plan /work on wt loss  Px metoprolol xl 50 mg daily Rev checking pulse  inst to watch for dizziness or other side effects and update [place]  F/u planned

## 2019-04-09 NOTE — Assessment & Plan Note (Signed)
Pt was supposed to f/u with CT in the spring- but did not due to the pandemic  Will discuss this at f/u and plan when she feels comfortable

## 2019-05-08 ENCOUNTER — Encounter: Payer: Self-pay | Admitting: Family Medicine

## 2019-05-08 ENCOUNTER — Other Ambulatory Visit: Payer: Self-pay

## 2019-05-08 ENCOUNTER — Ambulatory Visit: Payer: 59 | Admitting: Family Medicine

## 2019-05-08 VITALS — BP 140/72 | HR 67 | Temp 96.7°F | Ht 61.0 in | Wt 162.4 lb

## 2019-05-08 DIAGNOSIS — Z8669 Personal history of other diseases of the nervous system and sense organs: Secondary | ICD-10-CM | POA: Diagnosis not present

## 2019-05-08 DIAGNOSIS — E6609 Other obesity due to excess calories: Secondary | ICD-10-CM | POA: Diagnosis not present

## 2019-05-08 DIAGNOSIS — I10 Essential (primary) hypertension: Secondary | ICD-10-CM

## 2019-05-08 DIAGNOSIS — Z6830 Body mass index (BMI) 30.0-30.9, adult: Secondary | ICD-10-CM | POA: Diagnosis not present

## 2019-05-08 MED ORDER — LISINOPRIL 10 MG PO TABS: 10.0000 mg | ORAL_TABLET | Freq: Every day | ORAL | 3 refills | Status: DC

## 2019-05-08 NOTE — Assessment & Plan Note (Signed)
Addition of metoprolol and mild imp in bp have helped her migraine symptoms

## 2019-05-08 NOTE — Patient Instructions (Addendum)
Start walking again - aim for 30 or more minutes 5 days per week   Look at the [facility] eating plan   There are walking videos you can get to walk in front of the TV  Try to eat less processed foods   Continue your metoprolol xl 50 mg  Add lisinopril 10 mg once daily   If any problems or side effects let us know (dizziness, dry cough without cause)   Follow up in 6 weeks

## 2019-05-08 NOTE — Assessment & Plan Note (Signed)
Discussed how this problem influences overall health and the risks it imposes  Reviewed plan for weight loss with lower calorie diet (via better food choices and also portion control or program like weight watchers) and exercise building up to or more than 30 minutes 5 days per week including some aerobic activity   Pt is ready to work on exercise-walking Also has DASH eating plan to review

## 2019-05-08 NOTE — Progress Notes (Signed)
Subjective:    Patient ID: Felicia Robbins, female    DOB: 02/19/1956, 64 y.o.   MRN: [id number]  This visit occurred during the SARS-CoV-2 public health emergency.  Safety protocols were in place, including screening questions prior to the visit, additional usage of staff PPE, and extensive cleaning of exam room while observing appropriate contact time as indicated for disinfecting solutions.    HPI Pt presents for f/u of HTN  Wt Readings from Last 3 Encounters:  05/08/19 162 lb 6 oz (73.7 kg)  04/07/19 163 lb (73.9 kg)  08/29/18 155 lb (70.3 kg)  has not changed lifestyle habits  Does not have a healthy lifestyle-not ready to change  30.68 kg/m   Last visit pt presented with elevated BP Discussed lifestyle change  Px metoprolol xl 50 mg daily   BP Readings from Last 3 Encounters:  05/08/19 (!) 152/86  04/07/19 (!) 148/84  02/10/19 (!) 154/78   Having no more headaches -thankful for that  Stress is not high    She does not check her BP at home   Pulse Readings from Last 3 Encounters:  05/08/19 67  02/10/19 70  08/29/18 70   Lab Results  Component Value Date   CREATININE 0.86 08/17/2018   BUN 16 08/17/2018   NA 140 08/17/2018   K 4.6 08/17/2018   CL 104 08/17/2018   CO2 28 08/17/2018   Lab Results  Component Value Date   CHOL 192 03/09/2018   HDL 76.80 03/09/2018   LDLCALC 100 (H) 03/09/2018   LDLDIRECT 121.7 12/27/2012   TRIG 77.0 03/09/2018   CHOLHDL 3 03/09/2018   Patient Active Problem List   Diagnosis Date Noted  . Allergic conjunctivitis 09/16/2018  . Left groin pain 08/17/2018  . IBS (irritable bowel syndrome) 08/17/2018  . Left sided abdominal pain 06/27/2018  . Constipation 06/27/2018  . Colon cancer screening 03/21/2018  . Lung nodules 03/21/2018  . Osteopenia 03/21/2018  . LBBB (left bundle branch block) 01/08/2018  . Chest pain 12/17/2017  . Palpitations 12/17/2017  . Hyperlipidemia 03/17/2017  . Elevated glucose level 03/17/2017   . Obesity 02/11/2016  . Lactose intolerance 02/11/2016  . Stress reaction, emotional 01/19/2012  . Gynecologic exam normal 12/08/2010  . Routine general medical examination at a health care facility 11/29/2010  . Hot flashes 08/11/2010  . Allergic rhinitis 08/15/2007  . FIBROCYSTIC BREAST DISEASE 09/24/2006  . HX, PERSONAL, COLONIC POLYPS 09/24/2006  . MIGRAINE, COMMON 08/25/2006  . DECREASED HEARING 08/25/2006  . Essential hypertension 08/25/2006  . Esophageal reflux 08/25/2006  . MIGRAINES, HX OF 08/25/2006   Past Medical History:  Diagnosis Date  . Depression   . GERD (gastroesophageal reflux disease)   . Headache(784.0)   . 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 Left bundle branch block   . WPW (Wolff-Parkinson-White syndrome)    See scanned history note   Past Surgical History:  Procedure Laterality Date  . BIOPSY BOWEL     small bowel biopsy- normal (colon plyps / colonoscopy 03/1999)  . BREAST BIOPSY Left 07/13/2013   Procedure: BREAST BIOPSY WITH NEEDLE LOCALIZATION;  Surgeon: Marcello Moores A. Cornett, MD;  Location: [place];  Service: General;  Laterality: Left;  . BREAST SURGERY     Lt breast nodule - neg.  . CHOLECYSTECTOMY  2005   lap choli  . INNER EAR SURGERY  2011   Multiple surgeries Lt ear. Hearing device implanted Lt ear 2011.  Marland Kitchen PAROTID  GLAND TUMOR EXCISION  2000   left  . TUBAL LIGATION     Social History   Tobacco Use  . Smoking status: Former Smoker    Packs/day: 1.00    Years: 15.00    Pack years: 15.00    Types: Cigarettes    Quit date: 04/20/1986    Years since quitting: 33.0  . Smokeless tobacco: Never Used  Substance Use Topics  . Alcohol use: Yes    Alcohol/week: 0.0 standard drinks    Comment: 2-3 drinks a week  . Drug use: No   Family History  Problem Relation Age of Onset  . Cancer Mother        breast  . Diabetes Mother   . Stroke Mother   . Multiple sclerosis Mother   . Crohn's disease Brother   .  Cancer Sister 80       breast  . Diabetes Brother   . Colon cancer Neg Hx   . Liver cancer Neg Hx   . Rectal cancer Neg Hx   . Stomach cancer Neg Hx    Allergies  Allergen Reactions  . Tetracycline Other (See Comments)    REACTION: rash, passed out REACTION:  passed out   Current Outpatient Medications on File Prior to Visit  Medication Sig Dispense Refill  . albuterol (PROAIR HFA) 108 (90 Base) MCG/ACT inhaler Inhale 1-2 puffs into the lungs every 6 (six) hours as needed for wheezing or shortness of breath. 8 g 0  . fluorouracil (EFUDEX) 5 % cream Apply 1 application topically daily. For 3 weeks    . ibuprofen (ADVIL) 200 MG tablet Take 400 mg by mouth every 6 (six) hours as needed (as needed for pain).    . metoprolol succinate (TOPROL-XL) 50 MG 24 hr tablet Take 1 tablet (50 mg total) by mouth daily. Take with or immediately following a meal. 30 tablet 3  . omeprazole (PRILOSEC) 20 MG capsule Take 1 capsule (20 mg total) by mouth daily. 30 capsule 0  . tiZANidine (ZANAFLEX) 4 MG tablet Take 0.5-1 tablets (2-4 mg total) by mouth 4 (four) times daily as needed for muscle spasms. And headache 30 tablet 11   No current facility-administered medications on file prior to visit.    Review of Systems  Constitutional: Negative for activity change, appetite change, fatigue, fever and unexpected weight change.  HENT: Negative for congestion, ear pain, rhinorrhea, sinus pressure and sore throat.   Eyes: Negative for pain, redness and visual disturbance.  Respiratory: Negative for cough, shortness of breath and wheezing.   Cardiovascular: Negative for chest pain and palpitations.  Gastrointestinal: Negative for abdominal pain, blood in stool, constipation and diarrhea.  Endocrine: Negative for polydipsia and polyuria.  Genitourinary: Negative for dysuria, frequency and urgency.  Musculoskeletal: Negative for arthralgias, back pain and myalgias.  Skin: Negative for pallor and rash.   Allergic/Immunologic: Negative for environmental allergies.  Neurological: Negative for dizziness, syncope and headaches.       Headaches are better   Hematological: Negative for adenopathy. Does not bruise/bleed easily.  Psychiatric/Behavioral: Negative for decreased concentration and dysphoric mood. The patient is not nervous/anxious.        Objective:   Physical Exam Constitutional:      General: She is not in acute distress.    Appearance: Normal appearance. She is well-developed. She is obese. She is not ill-appearing or diaphoretic.  HENT:     Head: Normocephalic and atraumatic.  Eyes:     Conjunctiva/sclera: Conjunctivae normal.  Pupils: Pupils are equal, round, and reactive to light.  Neck:     Thyroid: No thyromegaly.     Vascular: No carotid bruit or JVD.  Cardiovascular:     Rate and Rhythm: Normal rate and regular rhythm.     Heart sounds: Normal heart sounds. No gallop.   Pulmonary:     Effort: Pulmonary effort is normal. No respiratory distress.     Breath sounds: Normal breath sounds. No wheezing or rales.  Abdominal:     General: Abdomen is flat. There is no distension or abdominal bruit.     Palpations: There is no mass.  Musculoskeletal:     Cervical back: Normal range of motion and neck supple.     Right lower leg: No edema.     Left lower leg: No edema.  Lymphadenopathy:     Cervical: No cervical adenopathy.  Skin:    General: Skin is warm and dry.     Findings: No rash.  Neurological:     Mental Status: She is alert.     Coordination: Coordination normal.     Deep Tendon Reflexes: Reflexes are normal and symmetric. Reflexes normal.  Psychiatric:        Mood and Affect: Mood normal.           Assessment & Plan:   Problem List Items Addressed This Visit      Cardiovascular and Mediastinum   Essential hypertension - Primary    Mild improvement with metoprolol xl 50 mg  Also headaches improved  Still not at goal BP: 140/72  Will add  ace- lisinopril 10 mg daily  Disc poss side eff incl hypotension and cough-will update  Disc goal for exercise and less processed foods  F/u approx 6 wk      Relevant Medications   lisinopril (ZESTRIL) 10 MG tablet     Other   History of migraine headaches    Addition of metoprolol and mild imp in bp have helped her migraine symptoms       Obesity    Discussed how this problem influences overall health and the risks it imposes  Reviewed plan for weight loss with lower calorie diet (via better food choices and also portion control or program like weight watchers) and exercise building up to or more than 30 minutes 5 days per week including some aerobic activity   Pt is ready to work on exercise-walking Also has DASH eating plan to review

## 2019-05-08 NOTE — Assessment & Plan Note (Signed)
Mild improvement with metoprolol xl 50 mg  Also headaches improved  Still not at goal BP: 140/72  Will add ace- lisinopril 10 mg daily  Disc poss side eff incl hypotension and cough-will update  Disc goal for exercise and less processed foods  F/u approx 6 wk

## 2019-06-20 ENCOUNTER — Other Ambulatory Visit: Payer: Self-pay

## 2019-06-20 ENCOUNTER — Ambulatory Visit: Payer: 59 | Admitting: Family Medicine

## 2019-06-20 ENCOUNTER — Encounter: Payer: Self-pay | Admitting: Family Medicine

## 2019-06-20 VITALS — BP 120/72 | HR 62 | Temp 97.3°F | Ht 61.0 in | Wt 163.4 lb

## 2019-06-20 DIAGNOSIS — E6609 Other obesity due to excess calories: Secondary | ICD-10-CM

## 2019-06-20 DIAGNOSIS — I10 Essential (primary) hypertension: Secondary | ICD-10-CM | POA: Diagnosis not present

## 2019-06-20 DIAGNOSIS — Z6830 Body mass index (BMI) 30.0-30.9, adult: Secondary | ICD-10-CM

## 2019-06-20 MED ORDER — METOPROLOL SUCCINATE ER 50 MG PO TB24: 50.0000 mg | ORAL_TABLET | Freq: Every day | ORAL | 11 refills | Status: DC

## 2019-06-20 MED ORDER — LISINOPRIL 10 MG PO TABS: 10.0000 mg | ORAL_TABLET | Freq: Every day | ORAL | 11 refills | Status: DC

## 2019-06-20 NOTE — Assessment & Plan Note (Signed)
Much improved with the combination of lisinopril and metoprolol xl  BP: 120/72  Pulse Rate: 62  Enc her to keep exercising Also to start watching diet for sodium and processed foods  Goal is also wt loss F/u om 2-3 mo for health mt exam with lab

## 2019-06-20 NOTE — Assessment & Plan Note (Signed)
Discussed how this problem influences overall health and the risks it imposes  Reviewed plan for weight loss with lower calorie diet (via better food choices and also portion control or program like weight watchers) and exercise building up to or more than 30 minutes 5 days per week including some aerobic activity   Pt is doing better with exercise  Not motivated for diet change yet - enc her to think about small changes gradually

## 2019-06-20 NOTE — Patient Instructions (Signed)
Let's schedule physical in 2-3 months with labs prior   Continue exercise  Think about some gradual diet changes  BP is great continue current medicines   Take care of yourself !

## 2019-06-20 NOTE — Progress Notes (Signed)
Subjective:    Patient ID: Felicia Robbins, female    DOB: 1955-06-21, 64 y.o.   MRN: [id number]  This visit occurred during the SARS-CoV-2 public health emergency.  Safety protocols were in place, including screening questions prior to the visit, additional usage of staff PPE, and extensive cleaning of exam room while observing appropriate contact time as indicated for disinfecting solutions.    HPI Pt presents for f/u of HTN   Wt Readings from Last 3 Encounters:  06/20/19 163 lb 7 oz (74.1 kg)  05/08/19 162 lb 6 oz (73.7 kg)  04/07/19 163 lb (73.9 kg)  exercise - she has used an empty clubhouse  Likes to walk outside 2 mi per day  4 days per week- will work up to 5  30.88 kg/m   Diet is not really healthy  Not motivated yet   Last visit noted mild improvement with metoprolol xl 50 -also less headache  Added lisinopril 10 mg daily   Doing very well  At home the highest is 136/79  65 pulse   No lows or low pulse rate  No side effects with lisinopril   Pulse Readings from Last 3 Encounters:  06/20/19 62  05/08/19 67  02/10/19 70     Lab Results  Component Value Date   CREATININE 0.86 08/17/2018   BUN 16 08/17/2018   NA 140 08/17/2018   K 4.6 08/17/2018   CL 104 08/17/2018   CO2 28 08/17/2018   Patient Active Problem List   Diagnosis Date Noted  . Allergic conjunctivitis 09/16/2018  . Left groin pain 08/17/2018  . IBS (irritable bowel syndrome) 08/17/2018  . Left sided abdominal pain 06/27/2018  . Constipation 06/27/2018  . Colon cancer screening 03/21/2018  . Lung nodules 03/21/2018  . Osteopenia 03/21/2018  . LBBB (left bundle branch block) 01/08/2018  . Chest pain 12/17/2017  . Palpitations 12/17/2017  . Hyperlipidemia 03/17/2017  . Elevated glucose level 03/17/2017  . Obesity 02/11/2016  . Lactose intolerance 02/11/2016  . Stress reaction, emotional 01/19/2012  . Gynecologic exam normal 12/08/2010  . Routine general medical examination at a  health care facility 11/29/2010  . Hot flashes 08/11/2010  . Allergic rhinitis 08/15/2007  . FIBROCYSTIC BREAST DISEASE 09/24/2006  . HX, PERSONAL, COLONIC POLYPS 09/24/2006  . Migraine without aura 08/25/2006  . DECREASED HEARING 08/25/2006  . Essential hypertension 08/25/2006  . Esophageal reflux 08/25/2006  . History of migraine headaches 08/25/2006   Past Medical History:  Diagnosis Date  . Depression   . GERD (gastroesophageal reflux disease)   . Headache(784.0)   . 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 Left bundle branch block   . WPW (Wolff-Parkinson-White syndrome)    See scanned history note   Past Surgical History:  Procedure Laterality Date  . BIOPSY BOWEL     small bowel biopsy- normal (colon plyps / colonoscopy 03/1999)  . BREAST BIOPSY Left 07/13/2013   Procedure: BREAST BIOPSY WITH NEEDLE LOCALIZATION;  Surgeon: Marcello Moores A. Cornett, MD;  Location: [facility];  Service: General;  Laterality: Left;  . BREAST SURGERY     Lt breast nodule - neg.  . CHOLECYSTECTOMY  2005   lap choli  . INNER EAR SURGERY  2011   Multiple surgeries Lt ear. Hearing device implanted Lt ear 2011.  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 TUBAL LIGATION     Social History   Tobacco Use  .  Smoking status: Former Smoker    Packs/day: 1.00    Years: 15.00    Pack years: 15.00    Types: Cigarettes    Quit date: 04/20/1986    Years since quitting: 33.1  . Smokeless tobacco: Never Used  Substance Use Topics  . Alcohol use: Yes    Alcohol/week: 0.0 standard drinks    Comment: 2-3 drinks a week  . Drug use: No   Family History  Problem Relation Age of Onset  . Cancer Mother        breast  . Diabetes Mother   . Stroke Mother   . Multiple sclerosis Mother   . Crohn's disease Brother   . Cancer Sister 12       breast  . Diabetes Brother   . Colon cancer Neg Hx   . Liver cancer Neg Hx   . Rectal cancer Neg Hx   . Stomach cancer Neg Hx    Allergies    Allergen Reactions  . Tetracycline Other (See Comments)    REACTION: rash, passed out REACTION:  passed out   Current Outpatient Medications on File Prior to Visit  Medication Sig Dispense Refill  . albuterol (PROAIR HFA) 108 (90 Base) MCG/ACT inhaler Inhale 1-2 puffs into the lungs every 6 (six) hours as needed for wheezing or shortness of breath. 8 g 0  . fluorouracil (EFUDEX) 5 % cream Apply 1 application topically daily. For 3 weeks    . ibuprofen (ADVIL) 200 MG tablet Take 400 mg by mouth every 6 (six) hours as needed (as needed for pain).    Marland Kitchen omeprazole (PRILOSEC) 20 MG capsule Take 1 capsule (20 mg total) by mouth daily. 30 capsule 0  . tiZANidine (ZANAFLEX) 4 MG tablet Take 0.5-1 tablets (2-4 mg total) by mouth 4 (four) times daily as needed for muscle spasms. And headache 30 tablet 11   No current facility-administered medications on file prior to visit.      Review of Systems  Constitutional: Negative for activity change, appetite change, fatigue, fever and unexpected weight change.  HENT: Negative for congestion, ear pain, rhinorrhea, sinus pressure and sore throat.   Eyes: Negative for pain, redness and visual disturbance.  Respiratory: Negative for cough, shortness of breath and wheezing.   Cardiovascular: Negative for chest pain and palpitations.  Gastrointestinal: Negative for abdominal pain, blood in stool, constipation and diarrhea.  Endocrine: Negative for polydipsia and polyuria.  Genitourinary: Negative for dysuria, frequency and urgency.  Musculoskeletal: Negative for arthralgias, back pain and myalgias.  Skin: Negative for pallor and rash.  Allergic/Immunologic: Negative for environmental allergies.  Neurological: Negative for dizziness, syncope and headaches.  Hematological: Negative for adenopathy. Does not bruise/bleed easily.  Psychiatric/Behavioral: Negative for decreased concentration and dysphoric mood. The patient is not nervous/anxious.         Objective:   Physical Exam Constitutional:      General: She is not in acute distress.    Appearance: Normal appearance. She is well-developed. She is obese. She is not ill-appearing.  HENT:     Head: Normocephalic and atraumatic.  Eyes:     Conjunctiva/sclera: Conjunctivae normal.     Pupils: Pupils are equal, round, and reactive to light.  Neck:     Thyroid: No thyromegaly.     Vascular: No carotid bruit or JVD.  Cardiovascular:     Rate and Rhythm: Normal rate and regular rhythm.     Heart sounds: Normal heart sounds. No gallop.   Pulmonary:  Effort: Pulmonary effort is normal. No respiratory distress.     Breath sounds: Normal breath sounds. No wheezing or rales.  Abdominal:     General: Bowel sounds are normal. There is no distension or abdominal bruit.     Palpations: Abdomen is soft. There is no mass.     Tenderness: There is no abdominal tenderness.  Musculoskeletal:     Cervical back: Normal range of motion and neck supple.     Right lower leg: No edema.     Left lower leg: No edema.  Lymphadenopathy:     Cervical: No cervical adenopathy.  Skin:    General: Skin is warm and dry.     Findings: No rash.  Neurological:     Mental Status: She is alert.     Deep Tendon Reflexes: Reflexes are normal and symmetric.  Psychiatric:        Mood and Affect: Mood normal.     Comments: Cheerful            Assessment & Plan:   Problem List Items Addressed This Visit      Cardiovascular and Mediastinum   Essential hypertension - Primary    Much improved with the combination of lisinopril and metoprolol xl  BP: 120/72  Pulse Rate: 62  Enc her to keep exercising Also to start watching diet for sodium and processed foods  Goal is also wt loss F/u om 2-3 mo for health mt exam with lab      Relevant Medications   metoprolol succinate (TOPROL-XL) 50 MG 24 hr tablet   lisinopril (ZESTRIL) 10 MG tablet     Other   Obesity    Discussed how this problem  influences overall health and the risks it imposes  Reviewed plan for weight loss with lower calorie diet (via better food choices and also portion control or program like weight watchers) and exercise building up to or more than 30 minutes 5 days per week including some aerobic activity   Pt is doing better with exercise  Not motivated for diet change yet - enc her to think about small changes gradually

## 2019-10-03 ENCOUNTER — Other Ambulatory Visit: Payer: 59

## 2019-10-06 ENCOUNTER — Encounter: Payer: 59 | Admitting: Family Medicine

## 2019-10-26 IMAGING — DX DG CHEST 2V
2 series · 2 of 2 positions shown · non-contrast
Comparison: March 17, 2016.

CLINICAL DATA: Chest pain, cough.

EXAM:
CHEST - 2 VIEW

[chest pa]
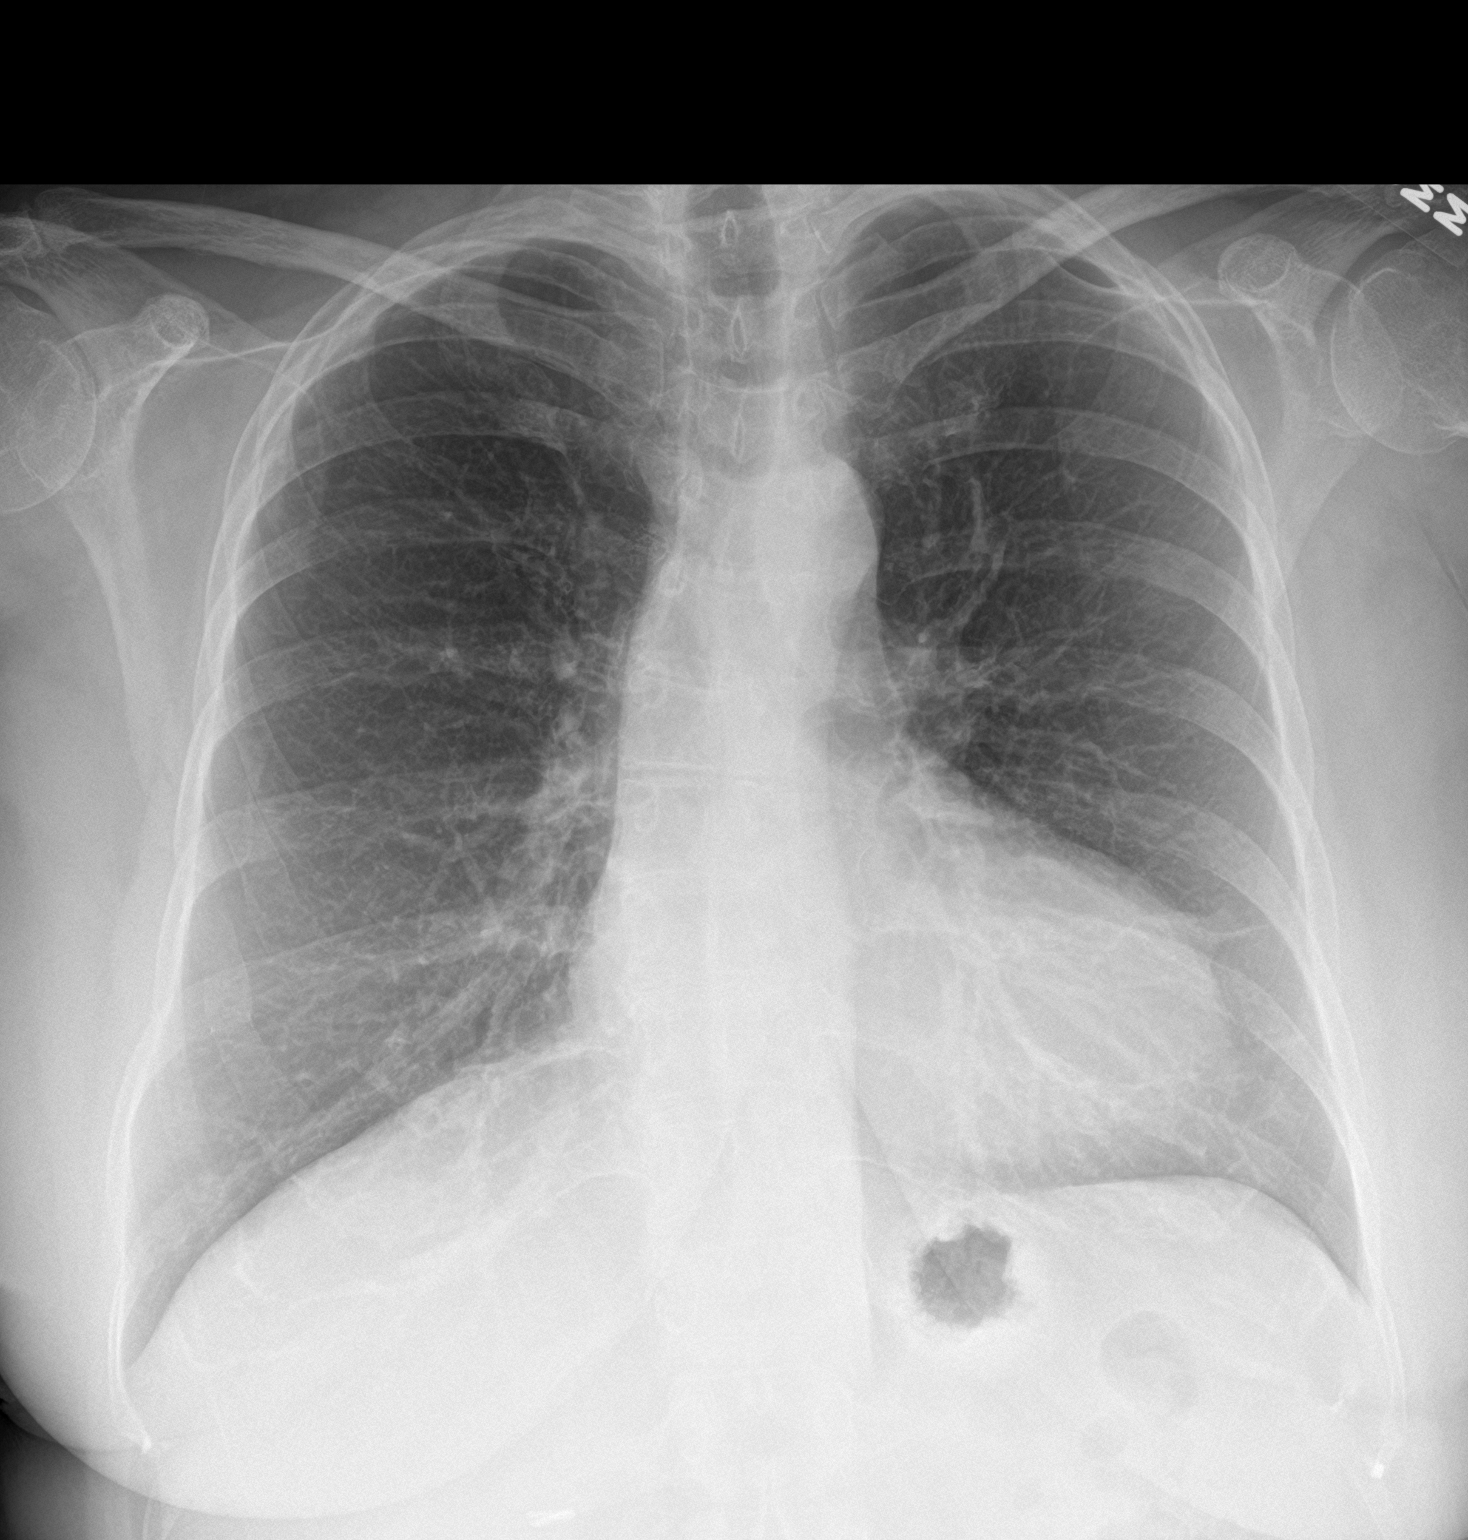

[chest lat]
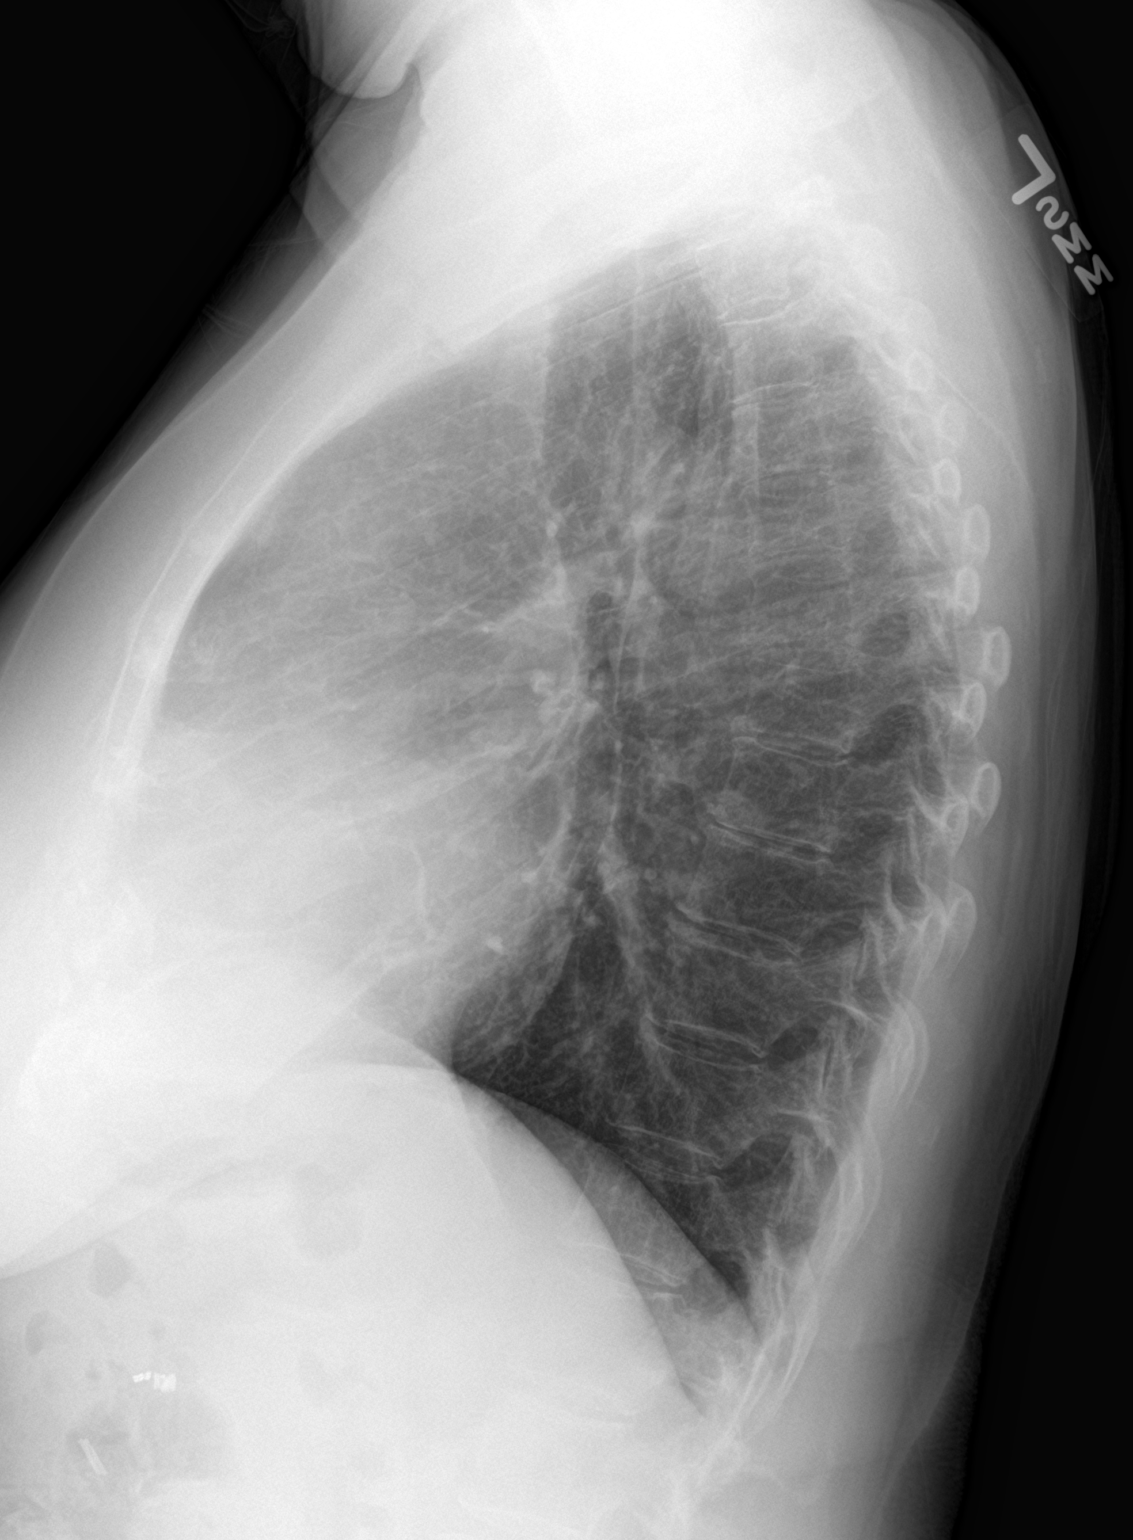

[2 of 2 positions shown; findings below may reference images not displayed]

FINDINGS: The heart size and mediastinal contours are within normal limits.
Both lungs are clear. No pneumothorax or pleural effusion is noted.
The visualized skeletal structures are unremarkable.
IMPRESSION: No active cardiopulmonary disease.

## 2020-07-09 ENCOUNTER — Other Ambulatory Visit: Payer: Self-pay | Admitting: Family Medicine

## 2020-07-30 ENCOUNTER — Ambulatory Visit (INDEPENDENT_AMBULATORY_CARE_PROVIDER_SITE_OTHER): Payer: 59 | Admitting: Family Medicine

## 2020-07-30 ENCOUNTER — Encounter: Payer: Self-pay | Admitting: Family Medicine

## 2020-07-30 ENCOUNTER — Other Ambulatory Visit: Payer: Self-pay

## 2020-07-30 VITALS — BP 140/72 | HR 74 | Temp 96.9°F | Ht 61.0 in | Wt 166.1 lb

## 2020-07-30 DIAGNOSIS — I10 Essential (primary) hypertension: Secondary | ICD-10-CM | POA: Diagnosis not present

## 2020-07-30 DIAGNOSIS — E782 Mixed hyperlipidemia: Secondary | ICD-10-CM

## 2020-07-30 DIAGNOSIS — G43009 Migraine without aura, not intractable, without status migrainosus: Secondary | ICD-10-CM | POA: Diagnosis not present

## 2020-07-30 MED ORDER — LISINOPRIL 10 MG PO TABS: 10.0000 mg | ORAL_TABLET | Freq: Every day | ORAL | 11 refills | Status: DC

## 2020-07-30 MED ORDER — METOPROLOL SUCCINATE ER 50 MG PO TB24: 50.0000 mg | ORAL_TABLET | Freq: Every day | ORAL | 11 refills | Status: DC

## 2020-07-30 NOTE — Assessment & Plan Note (Signed)
Less frequent headaches Uses tizanidine for rescue but in no longer covers it  Asked pt to call back with a list of covered muscle relaxers so I can choose one

## 2020-07-30 NOTE — Progress Notes (Signed)
Subjective:    Patient ID: Felicia Robbins, female    DOB: 06/12/1955, 65 y.o.   MRN: [id number]  This visit occurred during the SARS-CoV-2 public health emergency.  Safety protocols were in place, including screening questions prior to the visit, additional usage of staff PPE, and extensive cleaning of exam room while observing appropriate contact time as indicated for disinfecting solutions.    HPI Pt presents with BP concerns  Wt Readings from Last 3 Encounters:  07/30/20 166 lb 2 oz (75.4 kg)  06/20/19 163 lb 7 oz (74.1 kg)  05/08/19 162 lb 6 oz (73.7 kg)   31.39 kg/m  She ran out of her bp medicine  Has been fine up until now  Moved to [place] since last visit  She est to a new provider and then the practice closed    HTN BP Readings from Last 3 Encounters:  07/30/20 140/72  06/20/19 120/72  05/08/19 140/72    Takes lisinopril 10 mg daily  Metoprolol xl 50 mg daily   Pulse Readings from Last 3 Encounters:  07/30/20 74  06/20/19 62  05/08/19 67   Diet is not optimal  She tries to avoid salty things  Too much beef and pork    Exercise-none but wants to get back to walking    Vit d level was 23- now taking D Nl chem labs with glucose of 112 , a1c 5.6   Lipids LDL 160 and HDL 70   Has taken tizanidine in the past for headache/migrain  Headaches are doing better   Patient Active Problem List   Diagnosis Date Noted  . Allergic conjunctivitis 09/16/2018  . Left groin pain 08/17/2018  . IBS (irritable bowel syndrome) 08/17/2018  . Left sided abdominal pain 06/27/2018  . Constipation 06/27/2018  . Colon cancer screening 03/21/2018  . Lung nodules 03/21/2018  . Osteopenia 03/21/2018  . LBBB (left bundle branch block) 01/08/2018  . Chest pain 12/17/2017  . Palpitations 12/17/2017  . Hyperlipidemia 03/17/2017  . Elevated glucose level 03/17/2017  . Obesity 02/11/2016  . Lactose intolerance 02/11/2016  . Stress reaction, emotional 01/19/2012  .  Gynecologic exam normal 12/08/2010  . Routine general medical examination at a health care facility 11/29/2010  . Hot flashes 08/11/2010  . Allergic rhinitis 08/15/2007  . FIBROCYSTIC BREAST DISEASE 09/24/2006  . HX, PERSONAL, COLONIC POLYPS 09/24/2006  . Migraine without aura 08/25/2006  . DECREASED HEARING 08/25/2006  . Essential hypertension 08/25/2006  . Esophageal reflux 08/25/2006  . History of migraine headaches 08/25/2006   Past Medical History:  Diagnosis Date  . Depression   . GERD (gastroesophageal reflux disease)   . Headache(784.0)   . 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 Left bundle branch block   . WPW (Wolff-Parkinson-White syndrome)    See scanned history note   Past Surgical History:  Procedure Laterality Date  . BIOPSY BOWEL     small bowel biopsy- normal (colon plyps / colonoscopy 03/1999)  . BREAST BIOPSY Left 07/13/2013   Procedure: BREAST BIOPSY WITH NEEDLE LOCALIZATION;  Surgeon: Marcello Moores A. Cornett, MD;  Location: [facility];  Service: General;  Laterality: Left;  . BREAST SURGERY     Lt breast nodule - neg.  . CHOLECYSTECTOMY  2005   lap choli  . INNER EAR SURGERY  2011   Multiple surgeries Lt ear. Hearing device implanted Lt ear 2011.  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  TUBAL LIGATION     Social History   Tobacco Use  . Smoking status: Former Smoker    Packs/day: 1.00    Years: 15.00    Pack years: 15.00    Types: Cigarettes    Quit date: 04/20/1986    Years since quitting: 34.3  . Smokeless tobacco: Never Used  Vaping Use  . Vaping Use: Never used  Substance Use Topics  . Alcohol use: Yes    Alcohol/week: 0.0 standard drinks    Comment: 2-3 drinks a week  . Drug use: No   Family History  Problem Relation Age of Onset  . Cancer Mother        breast  . Diabetes Mother   . Stroke Mother   . Multiple sclerosis Mother   . Crohn's disease Brother   . Cancer Sister 31       breast  . Diabetes  Brother   . Colon cancer Neg Hx   . Liver cancer Neg Hx   . Rectal cancer Neg Hx   . Stomach cancer Neg Hx    Allergies  Allergen Reactions  . Tetracycline Other (See Comments)    REACTION: rash, passed out REACTION:  passed out   Current Outpatient Medications on File Prior to Visit  Medication Sig Dispense Refill  . albuterol (PROAIR HFA) 108 (90 Base) MCG/ACT inhaler Inhale 1-2 puffs into the lungs every 6 (six) hours as needed for wheezing or shortness of breath. 8 g 0  . fluorouracil (EFUDEX) 5 % cream Apply 1 application topically daily. For 3 weeks    . omeprazole (PRILOSEC) 20 MG capsule Take 1 capsule (20 mg total) by mouth daily. 30 capsule 0   No current facility-administered medications on file prior to visit.    Review of Systems  Constitutional: Negative for activity change, appetite change, fatigue, fever and unexpected weight change.  HENT: Negative for congestion, ear pain, rhinorrhea, sinus pressure and sore throat.   Eyes: Negative for pain, redness and visual disturbance.  Respiratory: Negative for cough, shortness of breath and wheezing.   Cardiovascular: Negative for chest pain and palpitations.  Gastrointestinal: Negative for abdominal pain, blood in stool, constipation and diarrhea.  Endocrine: Negative for polydipsia and polyuria.  Genitourinary: Negative for dysuria, frequency and urgency.  Musculoskeletal: Negative for arthralgias, back pain and myalgias.  Skin: Negative for pallor and rash.  Allergic/Immunologic: Negative for environmental allergies.  Neurological: Positive for headaches. Negative for dizziness and syncope.       Headaches are less frequent   Hematological: Negative for adenopathy. Does not bruise/bleed easily.  Psychiatric/Behavioral: Negative for decreased concentration and dysphoric mood. The patient is not nervous/anxious.        Objective:   Physical Exam Constitutional:      General: She is not in acute distress.     Appearance: Normal appearance. She is well-developed. She is obese. She is not ill-appearing.  HENT:     Head: Normocephalic and atraumatic.  Eyes:     Conjunctiva/sclera: Conjunctivae normal.     Pupils: Pupils are equal, round, and reactive to light.  Neck:     Thyroid: No thyromegaly.     Vascular: No carotid bruit or JVD.  Cardiovascular:     Rate and Rhythm: Normal rate and regular rhythm.     Heart sounds: Normal heart sounds. No gallop.   Pulmonary:     Effort: Pulmonary effort is normal. No respiratory distress.     Breath sounds: Normal breath sounds. No  wheezing or rales.  Abdominal:     General: Bowel sounds are normal. There is no distension or abdominal bruit.     Palpations: Abdomen is soft. There is no mass.     Tenderness: There is no abdominal tenderness.  Musculoskeletal:     Cervical back: Normal range of motion and neck supple.     Right lower leg: No edema.     Left lower leg: No edema.  Lymphadenopathy:     Cervical: No cervical adenopathy.  Skin:    General: Skin is warm and dry.     Coloration: Skin is not pale.     Findings: No erythema or rash.  Neurological:     Mental Status: She is alert.     Cranial Nerves: No cranial nerve deficit.     Coordination: Coordination normal.     Deep Tendon Reflexes: Reflexes are normal and symmetric. Reflexes normal.  Psychiatric:        Mood and Affect: Mood normal.           Assessment & Plan:   Problem List Items Addressed This Visit      Cardiovascular and Mediastinum   Migraine without aura    Less frequent headaches Uses tizanidine for rescue but in no longer covers it  Asked pt to call back with a list of covered muscle relaxers so I can choose one      Relevant Medications   metoprolol succinate (TOPROL-XL) 50 MG 24 hr tablet   lisinopril (ZESTRIL) 10 MG tablet   Essential hypertension - Primary    bp in fair control at this time (missed med today so up a bit) BP Readings from Last 1  Encounters:  07/30/20 140/72   No changes needed Refilled lisinopril 10 mg daily and metoprolol xl 50 mg daily Most recent labs reviewed (from [facility] in July)  Needs improvement of cholesterol Disc lifstyle change with low sodium diet and exercise        Relevant Medications   metoprolol succinate (TOPROL-XL) 50 MG 24 hr tablet   lisinopril (ZESTRIL) 10 MG tablet     Other   Hyperlipidemia    From labs at [facility] in July- LDL up to 160  Disc goals for lipids and reasons to control them Rev last labs with pt Rev low sat fat diet in detail Per pt - diet is terrible Counseled on better health habits Plans to est with [facility] office soon since she moved Recommend re check in about 3 mo after diet/exercise change        Relevant Medications   metoprolol succinate (TOPROL-XL) 50 MG 24 hr tablet   lisinopril (ZESTRIL) 10 MG tablet

## 2020-07-30 NOTE — Assessment & Plan Note (Signed)
bp in fair control at this time (missed med today so up a bit) BP Readings from Last 1 Encounters:  07/30/20 140/72   No changes needed Refilled lisinopril 10 mg daily and metoprolol xl 50 mg daily Most recent labs reviewed (from [facility] in July)  Needs improvement of cholesterol Disc lifstyle change with low sodium diet and exercise

## 2020-07-30 NOTE — Assessment & Plan Note (Signed)
From labs at [facility] in July- LDL up to 160  Disc goals for lipids and reasons to control them Rev last labs with pt Rev low sat fat diet in detail Per pt - diet is terrible Counseled on better health habits Plans to est with [facility] office soon since she moved Recommend re check in about 3 mo after diet/exercise change

## 2020-07-30 NOTE — Patient Instructions (Addendum)
Call your insurance company or pharmacy and find out what muscle relaxers are covered and get me a list so I can choose one that is affordable   For cholesterol Avoid red meat/ fried foods/ egg yolks/ fatty breakfast meats/ butter, cheese and high fat dairy/ and shellfish    Think about adding some exercise   Start back on your blood pressure medicine

## 2021-04-02 LAB — HM MAMMOGRAPHY: HM Mammogram: NORMAL (ref 0–4)

## 2021-04-02 LAB — HM DEXA SCAN

## 2021-08-19 ENCOUNTER — Other Ambulatory Visit: Payer: Self-pay | Admitting: Family Medicine

## 2021-08-19 NOTE — Telephone Encounter (Signed)
Please schedule annual exam this summer  ? ?Can be medicare after her birthday if she uses medicare  ?Thanks  ?

## 2021-08-19 NOTE — Telephone Encounter (Signed)
Pt hasn't been seen in over a year and no future appts., please advise  

## 2021-08-21 NOTE — Telephone Encounter (Signed)
Letter mailed letting pt know ?

## 2021-10-11 ENCOUNTER — Encounter: Payer: Self-pay | Admitting: Gastroenterology

## 2021-10-29 ENCOUNTER — Telehealth: Payer: Self-pay | Admitting: Family Medicine

## 2021-10-29 NOTE — Telephone Encounter (Signed)
Patient recently moved to [place] and would like to get a PCP closer to her. She would like to transfer care from [facility] to [facility]. Is this ok?

## 2021-10-29 NOTE — Telephone Encounter (Signed)
That is fine Very nice lady

## 2021-10-30 NOTE — Telephone Encounter (Signed)
LVM for pt to call back and set up TOC appt.

## 2021-11-10 ENCOUNTER — Telehealth: Payer: Self-pay | Admitting: Family Medicine

## 2021-11-10 NOTE — Telephone Encounter (Signed)
I will see her then as planned  Agree with ER precautions

## 2021-11-10 NOTE — Telephone Encounter (Signed)
[facility] - Client TELEPHONE ADVICE RECORD [facility] Patient Name: Felicia Robbins Gender: Female DOB: 30-Dec-1955 Age: 66 Y 22 D Return Phone Number: [phone] (Primary) Address: City/ State/ Zip: [place]  [address] Client [facility] - Client Client Site [place] Provider Glori Bickers, Roque Lias - MD Contact Type Call Who Is Calling Patient / Member / Family / Caregiver Call Type Triage / Clinical Relationship To Patient Self Return Phone Number [phone] (Primary) Chief Complaint Arm Pain (known cause) Reason for Call Symptomatic / Request for [place] states she has had phlebitis in her arm and states it is causing a whole lot of pain. Translation No Nurse Assessment Nurse: Waymond Cera, RN, Benjamine Mola Date/Time (Eastern Time): 11/10/2021 9:07:09 AM Confirm and document reason for call. If symptomatic, describe symptoms. ---Caller states she has had phlebitis in her arm and states it is causing a whole lot of pain. Self diagnosed phlebitis. States pain in crook of arm/elbow when moves the arm. States some swelling. Does the patient have any new or worsening symptoms? ---Yes Will a triage be completed? ---Yes Related visit to physician within the last 2 weeks? ---No Does the PT have any chronic conditions? (i.e. diabetes, asthma, this includes High risk factors for pregnancy, etc.) ---Yes List chronic conditions. ---HTN Is this a behavioral health or substance abuse call? ---No Guidelines Guideline Title Affirmed Question Affirmed Notes Nurse Date/Time Eilene Ghazi Time) Elbow Pain Swollen joint Cantrell, RN, Benjamine Mola 11/10/2021 9:08:50 AM Disp. Time Eilene Ghazi Time) Disposition Final User 11/10/2021 9:11:31 AM See PCP within 24 Hours Yes Cantrell, RN, Benjamine Mola Final Disposition 11/10/2021 9:11:31 AM See PCP within 24 Hours Yes Cantrell, RN,  Benjamine Mola PLEASE NOTE: All timestamps contained within this report are represented as [place] Standard Time. CONFIDENTIALTY NOTICE: This fax transmission is intended only for the addressee. It contains information that is legally privileged, confidential or otherwise protected from use or disclosure. If you are not the intended recipient, you are strictly prohibited from reviewing, disclosing, copying using or disseminating any of this information or taking any action in reliance on or regarding this information. If you have received this fax in error, please notify us immediately by telephone so that we can arrange for its return to [place]. Phone: [phone], Toll-Free: [phone], Fax: [phone] Page: 2 of 2 Call Id: [id number] [place] Disagree/Comply Comply Caller Understands Yes PreDisposition Home Care Care Advice Given Per Guideline SEE PCP WITHIN 24 HOURS: * IF OFFICE WILL BE OPEN: You need to be examined within the next 24 hours. Call your doctor (or NP/PA) when the office opens and make an appointment. CALL BACK IF: * Fever occurs * You become worse CARE ADVICE given per Elbow Pain (Adult) guideline. Referrals REFERRED TO PCP OFFIC

## 2021-11-10 NOTE — Telephone Encounter (Signed)
Pt already has appt schedule with Dr Glori Bickers on 11/11/21 at 12 noon. Sending note to Dr Glori Bickers and [place] CMA.

## 2021-11-10 NOTE — Telephone Encounter (Signed)
Patient called and stated she has phlebitis in the arm. Stated the pain is bearable. Didn't wont to see another doctor. Patient was triaged.

## 2021-11-11 ENCOUNTER — Ambulatory Visit: Payer: 59 | Admitting: Family Medicine

## 2021-11-11 ENCOUNTER — Ambulatory Visit (INDEPENDENT_AMBULATORY_CARE_PROVIDER_SITE_OTHER)
Admission: RE | Admit: 2021-11-11 | Discharge: 2021-11-11 | Disposition: A | Discharge status: Home / Self Care | Payer: 59 | Source: Ambulatory Visit | Attending: Family Medicine | Admitting: Family Medicine

## 2021-11-11 ENCOUNTER — Encounter: Payer: Self-pay | Admitting: Family Medicine

## 2021-11-11 ENCOUNTER — Ambulatory Visit: Payer: 59 | Admitting: Family

## 2021-11-11 DIAGNOSIS — M25522 Pain in left elbow: Secondary | ICD-10-CM

## 2021-11-11 MED ORDER — MELOXICAM 15 MG PO TABS: 15.0000 mg | ORAL_TABLET | Freq: Every day | ORAL | 1 refills | Status: DC

## 2021-11-11 NOTE — Assessment & Plan Note (Signed)
Medial, sharp and worse with movement  Reassuring exam  Suspect medial epicondylitis (pain is atypically in antecubital area however) XR ordered/pending Adv ice and forearm band/handout given meloxicam 15 mg daily with food as tolerated  Consider sport med depending on outcome

## 2021-11-11 NOTE — Progress Notes (Signed)
Subjective:    Patient ID: Felicia Robbins, female    DOB: 1955/06/07, 66 y.o.   MRN: [id number]  HPI Pt presents for L arm pain   Wt Readings from Last 3 Encounters:  11/11/21 150 lb 6.4 oz (68.2 kg)  07/30/20 166 lb 2 oz (75.4 kg)  06/20/19 163 lb 7 oz (74.1 kg)   28.42 kg/m   Started 3 wk ago  Antecubital area of L arm is painful and does not get better  Sharp pain  Comes and goes  Happens when she tries to use the arm  Any movement at all  Worse to straighten from bent position   No swelling  Veins look prominent  Feels warm  Never red  No insect or tick bites  No blood draws in that area  No pain in elbow   No injury that she remembers Does occ carry heavy bags  Has not done any new exercise   Has a sore area on L shoulder blade  Some numbness in fingers that comes and goes -not often (2,3,4 fingers)  No grip change    Does yoga  Does use that arm but ? If she injured  Is using caution    Otc:  Nothing   Patient Active Problem List   Diagnosis Date Noted   Left elbow pain 11/11/2021   Allergic conjunctivitis 09/16/2018   Left groin pain 08/17/2018   IBS (irritable bowel syndrome) 08/17/2018   Left sided abdominal pain 06/27/2018   Constipation 06/27/2018   Colon cancer screening 03/21/2018   Lung nodules 03/21/2018   Osteopenia 03/21/2018   LBBB (left bundle branch block) 01/08/2018   Chest pain 12/17/2017   Palpitations 12/17/2017   Hyperlipidemia 03/17/2017   Elevated glucose level 03/17/2017   Obesity 02/11/2016   Lactose intolerance 02/11/2016   Stress reaction, emotional 01/19/2012   Gynecologic exam normal 12/08/2010   Routine general medical examination at a health care facility 11/29/2010   Hot flashes 08/11/2010   Allergic rhinitis 08/15/2007   FIBROCYSTIC BREAST DISEASE 09/24/2006   HX, PERSONAL, COLONIC POLYPS 09/24/2006   Migraine without aura 08/25/2006   DECREASED HEARING 08/25/2006   Essential hypertension 08/25/2006    Esophageal reflux 08/25/2006   History of migraine headaches 08/25/2006   Past Medical History:  Diagnosis Date   Depression    GERD (gastroesophageal reflux disease)    Headache(784.0)    HOH (hard of hearing)    left-has inplant   Left bundle branch block    WPW (Wolff-Parkinson-White syndrome)    See scanned history note   Past Surgical History:  Procedure Laterality Date   BIOPSY BOWEL     small bowel biopsy- normal (colon plyps / colonoscopy 03/1999)   BREAST BIOPSY Left 07/13/2013   Procedure: BREAST BIOPSY WITH NEEDLE LOCALIZATION;  Surgeon: Marcello Moores A. Cornett, MD;  Location: [facility];  Service: General;  Laterality: Left;   BREAST SURGERY     Lt breast nodule - neg.   CHOLECYSTECTOMY  2005   lap choli   INNER EAR SURGERY  2011   Multiple surgeries Lt ear. Hearing device implanted Lt ear 2011.   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   TUBAL LIGATION     Social History   Tobacco Use   Smoking status: Former    Packs/day: 1.00    Years: 15.00    Total pack years: 15.00    Types: Cigarettes    Quit date: 04/20/1986  Years since quitting: 35.5   Smokeless tobacco: Never  Vaping Use   Vaping Use: Never used  Substance Use Topics   Alcohol use: Yes    Alcohol/week: 0.0 standard drinks of alcohol    Comment: 2-3 drinks a week   Drug use: No   Family History  Problem Relation Age of Onset   Cancer Mother        breast   Diabetes Mother    Stroke Mother    Multiple sclerosis Mother    Crohn's disease Brother    Cancer Sister 31       breast   Diabetes Brother    Colon cancer Neg Hx    Liver cancer Neg Hx    Rectal cancer Neg Hx    Stomach cancer Neg Hx    Allergies  Allergen Reactions   Tetracycline Other (See Comments)    REACTION: rash, passed out REACTION:  passed out   Current Outpatient Medications on File Prior to Visit  Medication Sig Dispense Refill   albuterol (PROAIR HFA) 108 (90 Base) MCG/ACT inhaler Inhale 1-2  puffs into the lungs every 6 (six) hours as needed for wheezing or shortness of breath. 8 g 0   fluorouracil (EFUDEX) 5 % cream Apply 1 application topically daily. For 3 weeks     lisinopril (ZESTRIL) 10 MG tablet TAKE 1 TABLET BY MOUTH EVERY DAY 30 tablet 3   metoprolol succinate (TOPROL-XL) 50 MG 24 hr tablet TAKE 1 TABLET BY MOUTH DAILY. TAKE WITH OR IMMEDIATELY FOLLOWING A MEAL. 30 tablet 3   No current facility-administered medications on file prior to visit.      Review of Systems  Constitutional:  Negative for activity change, appetite change, fatigue, fever and unexpected weight change.  HENT:  Negative for congestion, ear pain, rhinorrhea, sinus pressure and sore throat.   Eyes:  Negative for pain, redness and visual disturbance.  Respiratory:  Negative for cough, shortness of breath and wheezing.   Cardiovascular:  Negative for chest pain and palpitations.  Gastrointestinal:  Negative for abdominal pain, blood in stool, constipation and diarrhea.  Endocrine: Negative for polydipsia and polyuria.  Genitourinary:  Negative for dysuria, frequency and urgency.  Musculoskeletal:  Negative for arthralgias, back pain and myalgias.       Pain in elbow  Skin:  Negative for pallor and rash.  Allergic/Immunologic: Negative for environmental allergies.  Neurological:  Positive for numbness. Negative for dizziness, tremors, syncope, weakness and headaches.  Hematological:  Negative for adenopathy. Does not bruise/bleed easily.  Psychiatric/Behavioral:  Negative for decreased concentration and dysphoric mood. The patient is not nervous/anxious.        Objective:   Physical Exam Constitutional:      Appearance: Normal appearance. She is normal weight.  HENT:     Head: Atraumatic.  Eyes:     Conjunctiva/sclera: Conjunctivae normal.     Pupils: Pupils are equal, round, and reactive to light.  Neck:     Vascular: No carotid bruit.  Cardiovascular:     Rate and Rhythm: Normal rate and  regular rhythm.     Pulses: Normal pulses.  Pulmonary:     Effort: Pulmonary effort is normal.  Musculoskeletal:     Left elbow: No swelling, deformity, effusion or lacerations. Normal range of motion. Tenderness present in medial epicondyle.     Cervical back: Normal range of motion and neck supple. No rigidity or tenderness.     Comments: L elbow-nl rom with discomfort  Tender in  medial epicondyle area and medial antecubital space  No swelling No crepitus No skin change or warmth   No shoulder tenderness Nl rom shoulders and neck   Lymphadenopathy:     Cervical: No cervical adenopathy.  Neurological:     Mental Status: She is alert.     Cranial Nerves: No cranial nerve deficit.     Sensory: No sensory deficit.     Motor: No weakness.     Deep Tendon Reflexes: Reflexes normal.  Psychiatric:        Mood and Affect: Mood normal.           Assessment & Plan:   Problem List Items Addressed This Visit       Other   Left elbow pain    Medial, sharp and worse with movement  Reassuring exam  Suspect medial epicondylitis (pain is atypically in antecubital area however) XR ordered/pending Adv ice and forearm band/handout given meloxicam 15 mg daily with food as tolerated  Consider sport med depending on outcome      Relevant Orders   DG Elbow Complete Left

## 2021-11-11 NOTE — Patient Instructions (Addendum)
Use ice on your elbow  Avoid activities that hurt if you can   Look for a forearm band for elbow tendonitis   Take meloxicam as directed daily with food   Xray today    If you don't improve in 1-2 weeks we will get you to a sport medicine provider   If symptoms worsen in the meantime  let us know

## 2021-11-13 ENCOUNTER — Telehealth: Payer: Self-pay

## 2021-11-13 NOTE — Telephone Encounter (Signed)
-----   Message from Abner Greenspan, MD sent at 11/12/2021  8:54 PM EDT ----- There is some mild arthritis in your elbow but no fractures  Try the meloxicam and the ice and let us know if not improving

## 2021-11-13 NOTE — Telephone Encounter (Signed)
I left a message for the patient to return my call.

## 2021-12-04 ENCOUNTER — Telehealth: Payer: Self-pay | Admitting: Family Medicine

## 2021-12-04 NOTE — Telephone Encounter (Signed)
Pt has an appt at our office. Does not look like a TOC was ever initiated, just wanted to confirm this is okay.

## 2021-12-05 ENCOUNTER — Ambulatory Visit: Payer: 59 | Admitting: Family

## 2021-12-05 ENCOUNTER — Encounter: Payer: Self-pay | Admitting: Family

## 2021-12-05 VITALS — BP 98/58 | HR 60 | Temp 98.2°F | Ht 61.0 in | Wt 153.0 lb

## 2021-12-05 DIAGNOSIS — Z0000 Encounter for general adult medical examination without abnormal findings: Secondary | ICD-10-CM

## 2021-12-05 DIAGNOSIS — Z1211 Encounter for screening for malignant neoplasm of colon: Secondary | ICD-10-CM

## 2021-12-05 DIAGNOSIS — R7309 Other abnormal glucose: Secondary | ICD-10-CM | POA: Diagnosis not present

## 2021-12-05 DIAGNOSIS — Z86010 Personal history of colonic polyps: Secondary | ICD-10-CM

## 2021-12-05 DIAGNOSIS — Z13220 Encounter for screening for lipoid disorders: Secondary | ICD-10-CM | POA: Diagnosis not present

## 2021-12-05 LAB — LIPID PANEL
Cholesterol: 232 mg/dL — ABNORMAL HIGH (ref 0–200)
HDL: 74.8 mg/dL (ref >39.00)
LDL Cholesterol: 135 mg/dL — ABNORMAL HIGH (ref 0–99)
NonHDL: 157.27
Total CHOL/HDL Ratio: 3
Triglycerides: 111 mg/dL (ref 0.0–149.0)
VLDL: 22.2 mg/dL (ref 0.0–40.0)

## 2021-12-05 LAB — HEMOGLOBIN A1C: Hgb A1c MFr Bld: 5.5 % (ref 4.6–6.5)

## 2021-12-05 LAB — CBC WITH DIFFERENTIAL/PLATELET
Basophils Absolute: 0 10*3/uL (ref 0.0–0.1)
Basophils Relative: 0.6 % (ref 0.0–3.0)
Eosinophils Absolute: 0.4 10*3/uL (ref 0.0–0.7)
Eosinophils Relative: 5.5 % — ABNORMAL HIGH (ref 0.0–5.0)
HCT: 36.7 % (ref 36.0–46.0)
Hemoglobin: 12 g/dL (ref 12.0–15.0)
Lymphocytes Relative: 32.4 % (ref 12.0–46.0)
Lymphs Abs: 2.5 10*3/uL (ref 0.7–4.0)
MCHC: 32.7 g/dL (ref 30.0–36.0)
MCV: 97 fl (ref 78.0–100.0)
Monocytes Absolute: 0.6 10*3/uL (ref 0.1–1.0)
Monocytes Relative: 8 % (ref 3.0–12.0)
Neutro Abs: 4.2 10*3/uL (ref 1.4–7.7)
Neutrophils Relative %: 53.5 % (ref 43.0–77.0)
Platelets: 221 10*3/uL (ref 150.0–400.0)
RBC: 3.79 Mil/uL — ABNORMAL LOW (ref 3.87–5.11)
RDW: 14.4 % (ref 11.5–15.5)
WBC: 7.8 10*3/uL (ref 4.0–10.5)

## 2021-12-05 LAB — COMPREHENSIVE METABOLIC PANEL
ALT: 12 U/L (ref 0–35)
AST: 18 U/L (ref 0–37)
Albumin: 4.4 g/dL (ref 3.5–5.2)
Alkaline Phosphatase: 66 U/L (ref 39–117)
BUN: 28 mg/dL — ABNORMAL HIGH (ref 6–23)
CO2: 27 mEq/L (ref 19–32)
Calcium: 9.6 mg/dL (ref 8.4–10.5)
Chloride: 105 mEq/L (ref 96–112)
Creatinine, Ser: 1.08 mg/dL (ref 0.40–1.20)
GFR: 54.05 mL/min — ABNORMAL LOW (ref >60.00)
Glucose, Bld: 95 mg/dL (ref 70–99)
Potassium: 4.7 mEq/L (ref 3.5–5.1)
Sodium: 141 mEq/L (ref 135–145)
Total Bilirubin: 0.7 mg/dL (ref 0.2–1.2)
Total Protein: 6.9 g/dL (ref 6.0–8.3)

## 2021-12-05 MED ORDER — LISINOPRIL 10 MG PO TABS: 10.0000 mg | ORAL_TABLET | Freq: Every day | ORAL | 3 refills | Status: DC

## 2021-12-05 MED ORDER — ALBUTEROL SULFATE HFA 108 (90 BASE) MCG/ACT IN AERS: 1.0000 | INHALATION_SPRAY | Freq: Four times a day (QID) | RESPIRATORY_TRACT | 1 refills | Status: AC | PRN

## 2021-12-05 MED ORDER — METOPROLOL SUCCINATE ER 50 MG PO TB24: 50.0000 mg | ORAL_TABLET | Freq: Every day | ORAL | 3 refills | Status: DC

## 2021-12-05 NOTE — Progress Notes (Signed)
Felicia Robbins is a 66 y.o. female with the following history as recorded in Epic[employer]:  Patient Active Problem List   Diagnosis Date Noted   Left elbow pain 11/11/2021   Allergic conjunctivitis 09/16/2018   Left groin pain 08/17/2018   IBS (irritable bowel syndrome) 08/17/2018   Left sided abdominal pain 06/27/2018   Constipation 06/27/2018   Colon cancer screening 03/21/2018   Lung nodules 03/21/2018   Osteopenia 03/21/2018   LBBB (left bundle branch block) 01/08/2018   Chest pain 12/17/2017   Palpitations 12/17/2017   Hyperlipidemia 03/17/2017   Elevated glucose level 03/17/2017   Obesity 02/11/2016   Lactose intolerance 02/11/2016   Stress reaction, emotional 01/19/2012   Gynecologic exam normal 12/08/2010   Routine general medical examination at a health care facility 11/29/2010   Hot flashes 08/11/2010   Allergic rhinitis 08/15/2007   FIBROCYSTIC BREAST DISEASE 09/24/2006   HX, PERSONAL, COLONIC POLYPS 09/24/2006   Migraine without aura 08/25/2006   DECREASED HEARING 08/25/2006   Essential hypertension 08/25/2006   Esophageal reflux 08/25/2006   History of migraine headaches 08/25/2006    Current Outpatient Medications  Medication Sig Dispense Refill   albuterol (PROAIR HFA) 108 (90 Base) MCG/ACT inhaler Inhale 1-2 puffs into the lungs every 6 (six) hours as needed for wheezing or shortness of breath. 8 g 1   fluorouracil (EFUDEX) 5 % cream Apply 1 application topically daily. For 3 weeks     lisinopril (ZESTRIL) 10 MG tablet Take 1 tablet (10 mg total) by mouth daily. 90 tablet 3   meloxicam (MOBIC) 15 MG tablet Take 1 tablet (15 mg total) by mouth daily. With a meal 30 tablet 1   metoprolol succinate (TOPROL-XL) 50 MG 24 hr tablet Take 1 tablet (50 mg total) by mouth daily. TAKE WITH OR IMMEDIATELY FOLLOWING A MEAL. 90 tablet 3   No current facility-administered medications for this visit.    Allergies: Tetracycline  Past Medical History:  Diagnosis Date    Depression    GERD (gastroesophageal reflux disease)    Headache(784.0)    HOH (hard of hearing)    left-has inplant   Left bundle branch block    WPW (Wolff-Parkinson-White syndrome)    See scanned history note    Past Surgical History:  Procedure Laterality Date   BIOPSY BOWEL     small bowel biopsy- normal (colon plyps / colonoscopy 03/1999)   BREAST BIOPSY Left 07/13/2013   Procedure: BREAST BIOPSY WITH NEEDLE LOCALIZATION;  Surgeon: Marcello Moores A. Cornett, MD;  Location: [facility];  Service: General;  Laterality: Left;   BREAST SURGERY     Lt breast nodule - neg.   CHOLECYSTECTOMY  2005   lap choli   INNER EAR SURGERY  2011   Multiple surgeries Lt ear. Hearing device implanted Lt ear 2011.   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   TUBAL LIGATION      Family History  Problem Relation Age of Onset   Cancer Mother        breast   Diabetes Mother    Stroke Mother    Multiple sclerosis Mother    Crohn's disease Brother    Cancer Sister 21       breast   Diabetes Brother    Colon cancer Neg Hx    Liver cancer Neg Hx    Rectal cancer Neg Hx    Stomach cancer Neg Hx     Social History   Tobacco Use  Smoking status: Former    Packs/day: 1.00    Years: 15.00    Total pack years: 15.00    Types: Cigarettes    Quit date: 04/20/1986    Years since quitting: 35.6   Smokeless tobacco: Never  Substance Use Topics   Alcohol use: Yes    Alcohol/week: 0.0 standard drinks of alcohol    Comment: 2-3 drinks a week    Subjective:   Presents to [facility] from Dr. Glori Bickers in [facility]; would like to get yearly CPE updated; no acute concerns today; does need refills today;  [facility] managing pap smear/ DEXA/ mammogram- will get copies of those records; Due for repeat colonoscopy;  Review of Systems  Constitutional: Negative.   HENT: Negative.    Eyes: Negative.   Respiratory: Negative.    Cardiovascular: Negative.   Gastrointestinal: Negative.    Genitourinary: Negative.   Musculoskeletal: Negative.   Skin: Negative.   Neurological: Negative.   Endo/Heme/Allergies: Negative.   Psychiatric/Behavioral: Negative.       Objective:  Vitals:   12/05/21 0957  BP: (!) 98/58  Pulse: 60  Temp: 98.2 F (36.8 C)  TempSrc: Oral  SpO2: 98%  Weight: 153 lb (69.4 kg)  Height: 5' 1"  (1.549 m)    General: Well developed, well nourished, in no acute distress  Skin : Warm and dry.  Head: Normocephalic and atraumatic  Eyes: Sclera and conjunctiva clear; pupils round and reactive to light; extraocular movements intact  Ears: External normal; canals clear; tympanic membranes normal  Oropharynx: Pink, supple. No suspicious lesions  Neck: Supple without thyromegaly, adenopathy  Lungs: Respirations unlabored; clear to auscultation bilaterally without wheeze, rales, rhonchi  CVS exam: normal rate and regular rhythm.  Abdomen: Soft; nontender; nondistended; normoactive bowel sounds; no masses or hepatosplenomegaly  Musculoskeletal: No deformities; no active joint inflammation  Extremities: No edema, cyanosis, clubbing  Vessels: Symmetric bilaterally  Neurologic: Alert and oriented; speech intact; face symmetrical; moves all extremities well; CNII-XII intact without focal deficit   Assessment:  1. PE (physical exam), annual   2. Encounter for colonoscopy due to history of adenomatous colonic polyps   3. Lipid screening   4. Elevated glucose     Plan:  Age appropriate preventive healthcare needs addressed; encouraged regular eye doctor and dental exams; encouraged regular exercise; will update labs and refills as needed today; follow-up to be determined; Will get copies of mammogram, DEXA and pap smear; order updated for colonoscopy; She will consider getting Shingrix at later date;    No follow-ups on file.  Orders Placed This Encounter  Procedures   CBC with Differential/Platelet   Comp Met (CMET)   Lipid panel   Hemoglobin A1c    Ambulatory referral to Gastroenterology    Referral Priority:   Routine    Referral Type:   Consultation    Referral Reason:   Specialty Services Required    Referred to Provider:   Mauri Pole, MD    Number of Visits Requested:   1    Requested Prescriptions   Signed Prescriptions Disp Refills   lisinopril (ZESTRIL) 10 MG tablet 90 tablet 3    Sig: Take 1 tablet (10 mg total) by mouth daily.   metoprolol succinate (TOPROL-XL) 50 MG 24 hr tablet 90 tablet 3    Sig: Take 1 tablet (50 mg total) by mouth daily. TAKE WITH OR IMMEDIATELY FOLLOWING A MEAL.   albuterol (PROAIR HFA) 108 (90 Base) MCG/ACT inhaler 8 g 1  Sig: Inhale 1-2 puffs into the lungs every 6 (six) hours as needed for wheezing or shortness of breath.

## 2021-12-15 ENCOUNTER — Encounter: Payer: Self-pay | Admitting: Family

## 2022-01-06 ENCOUNTER — Other Ambulatory Visit: Payer: Self-pay | Admitting: Family Medicine

## 2022-02-03 ENCOUNTER — Ambulatory Visit (AMBULATORY_SURGERY_CENTER): Payer: Self-pay

## 2022-02-03 VITALS — Ht 61.0 in | Wt 153.0 lb

## 2022-02-03 DIAGNOSIS — Z86010 Personal history of colonic polyps: Secondary | ICD-10-CM

## 2022-02-03 MED ORDER — NA SULFATE-K SULFATE-MG SULF 17.5-3.13-1.6 GM/177ML PO SOLN: 1.0000 | ORAL | 0 refills | Status: DC

## 2022-02-03 MED ORDER — ONDANSETRON HCL 4 MG PO TABS: 4.0000 mg | ORAL_TABLET | ORAL | 0 refills | Status: DC

## 2022-02-03 NOTE — Progress Notes (Signed)
No egg or soy allergy known to patient  No issues known to pt with past sedation with any surgeries or procedures Patient denies ever being told they had issues or difficulty with intubation  No FH of Malignant Hyperthermia Pt is not on diet pills Pt is not on  home 02  Pt is not on blood thinners  Pt denies issues with constipation  No A fib or A flutter Have any cardiac testing pending--denied Pt instructed to use Singlecare.[email or web address] or GoodRx for a price reduction on prep   

## 2022-02-17 ENCOUNTER — Encounter: Payer: Self-pay | Admitting: Gastroenterology

## 2022-02-24 ENCOUNTER — Encounter: Payer: Self-pay | Admitting: Gastroenterology

## 2022-02-24 ENCOUNTER — Ambulatory Visit (AMBULATORY_SURGERY_CENTER): Payer: 59 | Admitting: Gastroenterology

## 2022-02-24 VITALS — BP 103/65 | HR 62 | Temp 98.0°F | Resp 11 | Ht 61.0 in | Wt 153.0 lb

## 2022-02-24 DIAGNOSIS — Z09 Encounter for follow-up examination after completed treatment for conditions other than malignant neoplasm: Secondary | ICD-10-CM | POA: Diagnosis present

## 2022-02-24 DIAGNOSIS — Z86010 Personal history of colonic polyps: Secondary | ICD-10-CM | POA: Diagnosis not present

## 2022-02-24 DIAGNOSIS — K621 Rectal polyp: Secondary | ICD-10-CM

## 2022-02-24 DIAGNOSIS — K635 Polyp of colon: Secondary | ICD-10-CM

## 2022-02-24 DIAGNOSIS — D123 Benign neoplasm of transverse colon: Secondary | ICD-10-CM | POA: Diagnosis not present

## 2022-02-24 DIAGNOSIS — D128 Benign neoplasm of rectum: Secondary | ICD-10-CM

## 2022-02-24 HISTORY — PX: COLONOSCOPY: SHX174

## 2022-02-24 MED ORDER — SODIUM CHLORIDE 0.9 % IV SOLN: 500.0000 mL | Freq: Once | INTRAVENOUS | Status: DC

## 2022-02-24 NOTE — Progress Notes (Signed)
Pt's states no medical or surgical changes since previsit or office visit. 

## 2022-02-24 NOTE — Progress Notes (Signed)
[facility] History and Physical   Primary Care Physician:  Marrian Salvage, [place]   Reason for Procedure:  History of adenomatous colon polyps  Plan:    Surveillance colonoscopy with possible interventions as needed     HPI: Felicia Robbins is a very pleasant 66 y.o. female here for surveillance colonoscopy. Denies any nausea, vomiting, abdominal pain, melena or bright red blood per rectum  The risks and benefits as well as alternatives of endoscopic procedure(s) have been discussed and reviewed. All questions answered. The patient agrees to proceed.    Past Medical History:  Diagnosis Date   Depression    GERD (gastroesophageal reflux disease)    Headache(784.0)    HOH (hard of hearing)    left-has inplant   Left bundle branch block    WPW (Wolff-Parkinson-White syndrome)    See scanned history note    Past Surgical History:  Procedure Laterality Date   BIOPSY BOWEL     small bowel biopsy- normal (colon plyps / colonoscopy 03/1999)   BREAST BIOPSY Left 07/13/2013   Procedure: BREAST BIOPSY WITH NEEDLE LOCALIZATION;  Surgeon: Marcello Moores A. Cornett, MD;  Location: [place];  Service: General;  Laterality: Left;   BREAST SURGERY     Lt breast nodule - neg.   CHOLECYSTECTOMY  04/21/2003   lap choli   COLONOSCOPY  08/29/2018   COLONOSCOPY  02/24/2022   INNER EAR SURGERY  04/20/2009   Multiple surgeries Lt ear. Hearing device implanted Lt ear 2011.   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 Authorizing Provider  lisinopril (ZESTRIL) 10 MG tablet Take 1 tablet (10 mg total) by mouth daily. 12/05/21  Yes Marrian Salvage, FNP  metoprolol succinate (TOPROL-XL) 50 MG 24 hr tablet Take 1 tablet (50 mg total) by mouth daily. TAKE WITH OR IMMEDIATELY FOLLOWING A MEAL. 12/05/21  Yes Marrian Salvage, FNP  albuterol [facility] HFA)  [phone] Base) MCG/ACT inhaler Inhale 1-2 puffs into the lungs every 6 (six) hours as needed for wheezing or shortness of breath. Patient not taking: Reported on 02/03/2022 12/05/21   Marrian Salvage, FNP    Current Outpatient Medications  Medication Sig Dispense Refill   lisinopril (ZESTRIL) 10 MG tablet Take 1 tablet (10 mg total) by mouth daily. 90 tablet 3   metoprolol succinate (TOPROL-XL) 50 MG 24 hr tablet Take 1 tablet (50 mg total) by mouth daily. TAKE WITH OR IMMEDIATELY FOLLOWING A MEAL. 90 tablet 3   albuterol (PROAIR HFA) 108 (90 Base) MCG/ACT inhaler Inhale 1-2 puffs into the lungs every 6 (six) hours as needed for wheezing or shortness of breath. (Patient not taking: Reported on 02/03/2022) 8 g 1   Current Facility-Administered Medications  Medication Dose Route Frequency Provider Last Rate Last Admin   0.9 %  sodium chloride infusion  500 mL Intravenous Once Mauri Pole, MD        Allergies as of 02/24/2022 - Review Complete 02/24/2022  Allergen Reaction Noted   Tetracycline Other (See Comments) 08/25/2006    Family History  Problem Relation Age of Onset   Cancer Mother        breast   Diabetes Mother    Stroke Mother    Multiple sclerosis Mother    Crohn's disease Brother    Cancer Sister 58       breast  Diabetes Brother    Colon cancer Neg Hx    Liver cancer Neg Hx    Rectal cancer Neg Hx    Stomach cancer Neg Hx     Social History   Socioeconomic History   Marital status: Divorced    Spouse name: Not on file   Number of children: Not on file   Years of education: Not on file   Highest education level: Not on file  Occupational History   Occupation: IT  Tobacco Use   Smoking status: Former    Packs/day: 1.00    Years: 15.00    Total pack years: 15.00    Types: Cigarettes    Quit date: 04/20/1986    Years since quitting: 35.8   Smokeless tobacco: Never  Vaping Use   Vaping Use: Never used  Substance and Sexual Activity   Alcohol  use: Yes    Alcohol/week: 0.0 standard drinks of alcohol    Comment: 2-3 drinks a week   Drug use: No   Sexual activity: Not on file  Other Topics Concern   Not on file  Social History Narrative   Not on file   Social Determinants of Health   Financial Resource Strain: Not on file  Food Insecurity: Not on file  Transportation Needs: Not on file  Physical Activity: Not on file  Stress: Not on file  Social Connections: Not on file  Intimate Partner Violence: Not on file    Review of Systems:  All other review of systems negative except as mentioned in the HPI.  Physical Exam: Vital signs in last 24 hours: Blood Pressure 128/76   Pulse 78   Temperature 98 F (36.7 C) (Temporal)   Height '5\' 1"'$  (1.549 m)   Weight 153 lb (69.4 kg)   Last Menstrual Period 11/07/2010   Oxygen Saturation 96%   Body Mass Index 28.91 kg/m  General:   Alert, NAD Lungs:  Clear .   Heart:  Regular rate and rhythm Abdomen:  Soft, nontender and nondistended. Neuro/Psych:  Alert and cooperative. Normal mood and affect. A and O x 3  Reviewed labs, radiology imaging, old records and pertinent past GI work up  Patient is appropriate for planned procedure(s) and anesthesia in an ambulatory setting   K. Denzil Magnuson , MD [phone]

## 2022-02-24 NOTE — Op Note (Signed)
[facility] Patient Name: Maniya Donovan Procedure Date: 02/24/2022 9:24 AM MRN: [id number] Endoscopist: Mauri Pole , MD, [id number] Age: 66 Referring MD:  Date of Birth: 1956/01/17 Gender: Female Account #: [id number] Procedure:                Colonoscopy Indications:              High risk colon cancer surveillance: Personal                            history of colonic polyps, High risk colon cancer                            surveillance: Personal history of adenoma (10 mm or                            greater in size), High risk colon cancer                            surveillance: Personal history of multiple (3 or                            more) adenomas Medicines:                Monitored Anesthesia Care Procedure:                Pre-Anesthesia Assessment:                           - Prior to the procedure, a History and Physical                            was performed, and patient medications and                            allergies were reviewed. The patient's tolerance of                            previous anesthesia was also reviewed. The risks                            and benefits of the procedure and the sedation                            options and risks were discussed with the patient.                            All questions were answered, and informed consent                            was obtained. Prior Anticoagulants: The patient has                            taken no anticoagulant or antiplatelet agents. ASA  Grade Assessment: II - A patient with mild systemic                            disease. After reviewing the risks and benefits,                            the patient was deemed in satisfactory condition to                            undergo the procedure.                           After obtaining informed consent, the colonoscope                            was passed under direct vision. Throughout the                             procedure, the patient's blood pressure, pulse, and                            oxygen saturations were monitored continuously. The                            PCF-HQ190L Colonoscope was introduced through the                            anus and advanced to the the cecum, identified by                            appendiceal orifice and ileocecal valve. The                            colonoscopy was performed without difficulty. The                            patient tolerated the procedure well. The quality                            of the bowel preparation was good. The ileocecal                            valve, appendiceal orifice, and rectum were                            photographed. Scope In: 9:30:47 AM Scope Out: 9:48:00 AM Scope Withdrawal Time: 0 hours 13 minutes 10 seconds  Total Procedure Duration: 0 hours 17 minutes 13 seconds  Findings:                 The perianal and digital rectal examinations were                            normal.  Three sessile polyps were found in the transverse                            colon. The polyps were 3 to 5 mm in size. These                            polyps were removed with a cold snare. Resection                            and retrieval were complete.                           A 3 mm polyp was found in the rectum. The polyp was                            sessile. The polyp was removed with a cold snare.                            Resection and retrieval were complete.                           Non-bleeding external and internal hemorrhoids were                            found during retroflexion. The hemorrhoids were                            medium-sized. Complications:            No immediate complications. Estimated Blood Loss:     Estimated blood loss was minimal. Impression:               - Three 3 to 5 mm polyps in the transverse colon,                            removed with a  cold snare. Resected and retrieved.                           - One 3 mm polyp in the rectum, removed with a cold                            snare. Resected and retrieved.                           - Non-bleeding external and internal hemorrhoids. Recommendation:           - Patient has a contact number available for                            emergencies. The signs and symptoms of potential                            delayed complications were discussed with the  patient. Return to normal activities tomorrow.                            Written discharge instructions were provided to the                            patient.                           - Resume previous diet.                           - Continue present medications.                           - Await pathology results.                           - Repeat colonoscopy in 3 - 5 years for                            surveillance based on pathology results. Mauri Pole, MD 02/24/2022 9:53:24 AM This report has been signed electronically.

## 2022-02-24 NOTE — Progress Notes (Signed)
Vss nad trans to pacu °

## 2022-02-24 NOTE — Patient Instructions (Signed)
HANDOUTS PROVIDED ON: POLYPS & HEMORRHOIDS  The polyps removed today have been sent for pathology.  The results can take 1-3 weeks to receive.  When your next colonoscopy should occur will be based on the pathology results.    You may resume your previous diet and medication schedule.  Thank you for allowing [place] to care for you today!!!   YOU HAD AN ENDOSCOPIC PROCEDURE TODAY AT [facility]:   Refer to the procedure report that was given to you for any specific questions about what was found during the examination.  If the procedure report does not answer your questions, please call your gastroenterologist to clarify.  If you requested that your care partner not be given the details of your procedure findings, then the procedure report has been included in a sealed envelope for you to review at your convenience later.  YOU SHOULD EXPECT: Some feelings of bloating in the abdomen. Passage of more gas than usual.  Walking can help get rid of the air that was put into your GI tract during the procedure and reduce the bloating. If you had a lower endoscopy (such as a colonoscopy or flexible sigmoidoscopy) you may notice spotting of blood in your stool or on the toilet paper. If you underwent a bowel prep for your procedure, you may not have a normal bowel movement for a few days.  Please Note:  You might notice some irritation and congestion in your nose or some drainage.  This is from the oxygen used during your procedure.  There is no need for concern and it should clear up in a day or so.  SYMPTOMS TO REPORT IMMEDIATELY:  Following lower endoscopy (colonoscopy or flexible sigmoidoscopy):  Excessive amounts of blood in the stool  Significant tenderness or worsening of abdominal pains  Swelling of the abdomen that is new, acute  Fever of 100F or higher  For urgent or emergent issues, a gastroenterologist can be reached at any hour by calling [phone]. Do not use MyChart  messaging for urgent concerns.    DIET:  We do recommend a small meal at first, but then you may proceed to your regular diet.  Drink plenty of fluids but you should avoid alcoholic beverages for 24 hours.  ACTIVITY:  You should plan to take it easy for the rest of today and you should NOT DRIVE or use heavy machinery until tomorrow (because of the sedation medicines used during the test).    FOLLOW UP: Our staff will call the number listed on your records the next business day following your procedure.  We will call around 7:15- 8:00 am to check on you and address any questions or concerns that you may have regarding the information given to you following your procedure. If we do not reach you, we will leave a message.     If any biopsies were taken you will be contacted by phone or by letter within the next 1-3 weeks.  Please call us at [phone] if you have not heard about the biopsies in 3 weeks.    SIGNATURES/CONFIDENTIALITY: You and/or your care partner have signed paperwork which will be entered into your electronic medical record.  These signatures attest to the fact that that the information above on your After Visit Summary has been reviewed and is understood.  Full responsibility of the confidentiality of this discharge information lies with you and/or your care-partner.

## 2022-02-25 ENCOUNTER — Telehealth: Payer: Self-pay

## 2022-02-25 NOTE — Telephone Encounter (Signed)
  Follow up Call-     02/24/2022    8:32 AM  Call back number  Post procedure Call Back phone  # [phone]  Permission to leave phone message Yes     Patient questions:  Do you have a fever, pain , or abdominal swelling? No. Pain Score  0 *  Have you tolerated food without any problems? Yes.    Have you been able to return to your normal activities? Yes.    Do you have any questions about your discharge instructions: Diet   No. Medications  No. Follow up visit  No.  Do you have questions or concerns about your Care? No.  Actions: * If pain score is 4 or above: No action needed, pain <4.

## 2022-03-16 ENCOUNTER — Encounter: Payer: Self-pay | Admitting: Gastroenterology

## 2022-04-21 ENCOUNTER — Ambulatory Visit: Payer: 59 | Admitting: Family

## 2022-04-21 ENCOUNTER — Ambulatory Visit (HOSPITAL_BASED_OUTPATIENT_CLINIC_OR_DEPARTMENT_OTHER)
Admission: RE | Admit: 2022-04-21 | Discharge: 2022-04-21 | Disposition: A | Discharge status: Home / Self Care | Payer: 59 | Source: Ambulatory Visit | Attending: Family | Admitting: Family

## 2022-04-21 ENCOUNTER — Encounter: Payer: Self-pay | Admitting: Family

## 2022-04-21 VITALS — BP 124/74 | HR 75 | Temp 98.3°F | Ht 61.0 in | Wt 154.2 lb

## 2022-04-21 DIAGNOSIS — R053 Chronic cough: Secondary | ICD-10-CM | POA: Diagnosis present

## 2022-04-21 MED ORDER — HYDROCODONE BIT-HOMATROP MBR 5-1.5 MG/5ML PO SOLN: 5.0000 mL | Freq: Three times a day (TID) | ORAL | 0 refills | Status: DC | PRN

## 2022-04-21 MED ORDER — AMOXICILLIN-POT CLAVULANATE 875-125 MG PO TABS: 1.0000 | ORAL_TABLET | Freq: Two times a day (BID) | ORAL | 0 refills | Status: AC

## 2022-04-21 MED ORDER — PREDNISONE 20 MG PO TABS: 40.0000 mg | ORAL_TABLET | Freq: Every day | ORAL | 0 refills | Status: DC

## 2022-04-21 NOTE — Progress Notes (Signed)
Felicia Robbins is a 67 y.o. female with the following history as recorded in EpicCare:  Patient Active Problem List   Diagnosis Date Noted   Left elbow pain 11/11/2021   Allergic conjunctivitis 09/16/2018   Left groin pain 08/17/2018   IBS (irritable bowel syndrome) 08/17/2018   Left sided abdominal pain 06/27/2018   Constipation 06/27/2018   Colon cancer screening 03/21/2018   Lung nodules 03/21/2018   Osteopenia 03/21/2018   LBBB (left bundle branch block) 01/08/2018   Chest pain 12/17/2017   Palpitations 12/17/2017   Hyperlipidemia 03/17/2017   Elevated glucose level 03/17/2017   Obesity 02/11/2016   Lactose intolerance 02/11/2016   Stress reaction, emotional 01/19/2012   Gynecologic exam normal 12/08/2010   Routine general medical examination at a health care facility 11/29/2010   Hot flashes 08/11/2010   Allergic rhinitis 08/15/2007   FIBROCYSTIC BREAST DISEASE 09/24/2006   HX, PERSONAL, COLONIC POLYPS 09/24/2006   Migraine without aura 08/25/2006   DECREASED HEARING 08/25/2006   Essential hypertension 08/25/2006   Esophageal reflux 08/25/2006   History of migraine headaches 08/25/2006    Current Outpatient Medications  Medication Sig Dispense Refill   albuterol (PROAIR HFA) 108 (90 Base) MCG/ACT inhaler Inhale 1-2 puffs into the lungs every 6 (six) hours as needed for wheezing or shortness of breath. 8 g 1   amoxicillin-clavulanate (AUGMENTIN) 875-125 MG tablet Take 1 tablet by mouth 2 (two) times daily for 10 days. 20 tablet 0   HYDROcodone bit-homatropine (HYCODAN) 5-1.5 MG/5ML syrup Take 5 mLs by mouth every 8 (eight) hours as needed for cough. 120 mL 0   lisinopril (ZESTRIL) 10 MG tablet Take 1 tablet (10 mg total) by mouth daily. 90 tablet 3   metoprolol succinate (TOPROL-XL) 50 MG 24 hr tablet Take 1 tablet (50 mg total) by mouth daily. TAKE WITH OR IMMEDIATELY FOLLOWING A MEAL. 90 tablet 3   predniSONE (DELTASONE) 20 MG tablet Take 2 tablets (40 mg total) by  mouth daily with breakfast. 10 tablet 0   No current facility-administered medications for this visit.    Allergies: Tetracycline  Past Medical History:  Diagnosis Date   Depression    GERD (gastroesophageal reflux disease)    Headache(784.0)    HOH (hard of hearing)    left-has inplant   Left bundle branch block    WPW (Wolff-Parkinson-White syndrome)    See scanned history note    Past Surgical History:  Procedure Laterality Date   BIOPSY BOWEL     small bowel biopsy- normal (colon plyps / colonoscopy 03/1999)   BREAST BIOPSY Left 07/13/2013   Procedure: BREAST BIOPSY WITH NEEDLE LOCALIZATION;  Surgeon: Marcello Moores A. Cornett, MD;  Location: [facility];  Service: General;  Laterality: Left;   BREAST SURGERY     Lt breast nodule - neg.   CHOLECYSTECTOMY  04/21/2003   lap choli   COLONOSCOPY  08/29/2018   COLONOSCOPY  02/24/2022   INNER EAR SURGERY  04/20/2009   Multiple surgeries Lt ear. Hearing device implanted Lt ear 2011.   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   TUBAL LIGATION      Family History  Problem Relation Age of Onset   Cancer Mother        breast   Diabetes Mother    Stroke Mother    Multiple sclerosis Mother    Crohn's disease Brother    Cancer Sister 38       breast   Diabetes Brother  Colon cancer Neg Hx    Liver cancer Neg Hx    Rectal cancer Neg Hx    Stomach cancer Neg Hx     Social History   Tobacco Use   Smoking status: Former    Packs/day: 1.00    Years: 15.00    Total pack years: 15.00    Types: Cigarettes    Quit date: 04/20/1986    Years since quitting: 36.0   Smokeless tobacco: Never  Substance Use Topics   Alcohol use: Yes    Alcohol/week: 0.0 standard drinks of alcohol    Comment: 2-3 drinks a week    Subjective:  Returned from international trip on December 20; notes she became very sick almost immediately once she got home with cough/ congestion/ vomiting; has only recently been able to eat  "normally" in the past few days; feels like cough is "deep"; limited benefit with her albuterol; no fever; has been extremely tired/ weak;      Objective:  Vitals:   04/21/22 1538  BP: 124/74  Pulse: 75  Temp: 98.3 F (36.8 C)  TempSrc: Oral  SpO2: 98%  Weight: 154 lb 3.2 oz (69.9 kg)  Height: '5\' 1"'$  (1.549 m)    General: Well developed, well nourished, in no acute distress  Skin : Warm and dry.  Head: Normocephalic and atraumatic  Eyes: Sclera and conjunctiva clear; pupils round and reactive to light; extraocular movements intact  Ears: External normal; canals clear; tympanic membranes normal  Oropharynx: Pink, supple. No suspicious lesions  Neck: Supple without thyromegaly, adenopathy  Lungs: Respirations unlabored; coarse breath sounds in upper lobes CVS exam: normal rate and regular rhythm.  Neurologic: Alert and oriented; speech intact; face symmetrical; moves all extremities well; CNII-XII intact without focal deficit   Assessment:  1. Persistent cough     Plan:  Concern for pneumonia; will also check D-dimer since symptoms started after recent international flight; update CXR today; start Augmentin, Prednisone and Hycodan cough syrup; increase fluids, rest and follow up to be determined;   No follow-ups on file.  Orders Placed This Encounter  Procedures   DG Chest 2 View    Standing Status:   Future    Number of Occurrences:   1    Standing Expiration Date:   04/22/2023    Order Specific Question:   Reason for Exam (SYMPTOM  OR DIAGNOSIS REQUIRED)    Answer:   persistent cough    Order Specific Question:   Preferred imaging location?    Answer:   [facility]   D-Dimer, Quantitative   CBC with Differential/Platelet    Requested Prescriptions   Signed Prescriptions Disp Refills   amoxicillin-clavulanate (AUGMENTIN) 875-125 MG tablet 20 tablet 0    Sig: Take 1 tablet by mouth 2 (two) times daily for 10 days.   predniSONE (DELTASONE) 20 MG tablet 10  tablet 0    Sig: Take 2 tablets (40 mg total) by mouth daily with breakfast.   HYDROcodone bit-homatropine (HYCODAN) 5-1.5 MG/5ML syrup 120 mL 0    Sig: Take 5 mLs by mouth every 8 (eight) hours as needed for cough.

## 2022-04-22 ENCOUNTER — Other Ambulatory Visit: Payer: Self-pay | Admitting: Family

## 2022-04-22 ENCOUNTER — Telehealth: Payer: Self-pay | Admitting: *Deleted

## 2022-04-22 ENCOUNTER — Other Ambulatory Visit: Payer: Self-pay

## 2022-04-22 DIAGNOSIS — R051 Acute cough: Secondary | ICD-10-CM

## 2022-04-22 DIAGNOSIS — R7989 Other specified abnormal findings of blood chemistry: Secondary | ICD-10-CM

## 2022-04-22 LAB — CBC WITH DIFFERENTIAL/PLATELET
Basophils Absolute: 0.1 10*3/uL (ref 0.0–0.1)
Basophils Relative: 1 % (ref 0.0–3.0)
Eosinophils Absolute: 0.2 10*3/uL (ref 0.0–0.7)
Eosinophils Relative: 3.2 % (ref 0.0–5.0)
HCT: 35.8 % — ABNORMAL LOW (ref 36.0–46.0)
Hemoglobin: 12.1 g/dL (ref 12.0–15.0)
Lymphocytes Relative: 34.1 % (ref 12.0–46.0)
Lymphs Abs: 2.6 10*3/uL (ref 0.7–4.0)
MCHC: 33.8 g/dL (ref 30.0–36.0)
MCV: 94.2 fl (ref 78.0–100.0)
Monocytes Absolute: 0.8 10*3/uL (ref 0.1–1.0)
Monocytes Relative: 10.6 % (ref 3.0–12.0)
Neutro Abs: 3.9 10*3/uL (ref 1.4–7.7)
Neutrophils Relative %: 51.1 % (ref 43.0–77.0)
Platelets: 312 10*3/uL (ref 150.0–400.0)
RBC: 3.8 Mil/uL — ABNORMAL LOW (ref 3.87–5.11)
RDW: 13.7 % (ref 11.5–15.5)
WBC: 7.6 10*3/uL (ref 4.0–10.5)

## 2022-04-22 LAB — D-DIMER, QUANTITATIVE: D-Dimer, Quant: 0.61 mcg/mL FEU — ABNORMAL HIGH (ref <0.50)

## 2022-04-22 NOTE — Telephone Encounter (Signed)
spoke with patient gave her the results. she stated that [facility] did not have cough syrup and she would like to send to the pharmacy here.

## 2022-04-22 NOTE — Addendum Note (Signed)
Addended by: Sena Hitch on: 04/22/2022 04:26 PM   Modules accepted: Orders

## 2022-04-22 NOTE — Telephone Encounter (Signed)
Patient's pharmacy doesn't have cough medicine , pt wants medication to be sent to [facility]    Cough medicine cancelled at [facility]

## 2022-04-23 ENCOUNTER — Other Ambulatory Visit (HOSPITAL_BASED_OUTPATIENT_CLINIC_OR_DEPARTMENT_OTHER): Payer: Self-pay

## 2022-04-23 ENCOUNTER — Ambulatory Visit (INDEPENDENT_AMBULATORY_CARE_PROVIDER_SITE_OTHER): Payer: 59

## 2022-04-23 DIAGNOSIS — R051 Acute cough: Secondary | ICD-10-CM

## 2022-04-23 DIAGNOSIS — R0602 Shortness of breath: Secondary | ICD-10-CM | POA: Diagnosis not present

## 2022-04-23 DIAGNOSIS — R7989 Other specified abnormal findings of blood chemistry: Secondary | ICD-10-CM

## 2022-04-23 MED ORDER — HYDROCODONE BIT-HOMATROP MBR 5-1.5 MG/5ML PO SOLN
5.0000 mL | Freq: Three times a day (TID) | ORAL | 0 refills | Status: DC | PRN
  Filled 2022-04-23: qty 120, 8d supply, fill #0

## 2022-04-23 MED ORDER — IOHEXOL 350 MG/ML SOLN
100.0000 mL | Freq: Once | INTRAVENOUS | Status: AC | PRN
  Administered 2022-04-23: 100 mL via INTRAVENOUS

## 2022-04-28 ENCOUNTER — Other Ambulatory Visit (HOSPITAL_BASED_OUTPATIENT_CLINIC_OR_DEPARTMENT_OTHER): Payer: Self-pay

## 2022-04-28 ENCOUNTER — Ambulatory Visit: Payer: 59 | Admitting: Family

## 2022-04-28 ENCOUNTER — Encounter: Payer: Self-pay | Admitting: Family

## 2022-04-28 VITALS — BP 114/74 | HR 70 | Temp 98.6°F | Resp 18 | Ht 61.0 in | Wt 157.4 lb

## 2022-04-28 DIAGNOSIS — J9801 Acute bronchospasm: Secondary | ICD-10-CM | POA: Diagnosis not present

## 2022-04-28 MED ORDER — PREDNISONE 20 MG PO TABS
20.0000 mg | ORAL_TABLET | Freq: Every day | ORAL | 0 refills | Status: DC
  Filled 2022-04-28: qty 7, 7d supply, fill #0

## 2022-04-28 NOTE — Patient Instructions (Signed)
Bronchospasm, Adult  Bronchospasm is a tightening of the smooth muscle that wraps around the small airways in the lungs. When the muscle tightens, the small airways narrow. Narrowed airways limit the air you breathe in or out of your lungs. Inflammation (swelling) and more mucus (sputum) than usual can further irritate the airways. This can make it very hard to breathe. Bronchospasm can happen suddenly or over a period of time. What are the causes? Common causes of this condition include: An infection, such as a cold or sinus drainage. Exercise. Strong odors from aerosol sprays, and fumes from perfume, candles, and household cleaners. Cold air. Stress or strong emotions such as crying or laughing. What increases the risk? The following factors may make you more likely to develop this condition: Having asthma. Smoking or being around someone who smokes (secondhand smoke). Seasonal allergies, such as pollen or mold. Allergic reaction (anaphylaxis) to food, medicine, or insect bites or stings. What are the signs or symptoms? Symptoms of this condition include: Making a high-pitched whistling sound when you breathe, most often when you breathe out (wheezing). Coughing. Chest tightness. Shortness of breath. Decreased ability to exercise. Noisy breathing or a high-pitched cough. How is this diagnosed? This condition may be diagnosed based on your medical history and a physical exam. Your health care provider may also perform tests, including: A chest X-ray. Lung function tests. How is this treated? This condition may be treated by: Using inhaled medicines. These open up (relax) the airways and help you breathe. They can be taken with a metered dose inhaler or a nebulizer device. Taking corticosteroid medicines. These may be given to reduce inflammation and swelling. Removing the irritant or trigger that started the bronchospasm. Follow these instructions at home: Medicines Take  over-the-counter and prescription medicines only as told by your health care provider. If you need to use an inhaler or nebulizer to take your medicine, ask your health care provider how to use it correctly. You may be given a spacer to use with your inhaler. This makes it easier to get the medicine from the inhaler into your lungs. Lifestyle Do not use any products that contain nicotine or tobacco. These products include cigarettes, chewing tobacco, and vaping devices, such as e-cigarettes. If you need help quitting, ask your health care provider. Keep track of things that trigger your bronchospasm. Avoid these if possible. When pollen, air pollution, or humidity levels are bad, keep windows closed and use an air conditioner or go to places that have air conditioning. Find ways to manage stress and your emotions, such as mindfulness, relaxation, or breathing exercises. Activity Some people have bronchospasm when they exercise. This is called exercise-induced bronchoconstriction (EIB). If you have this problem, talk with your health care provider about how to manage EIB. Some tips include: Using your fast-acting inhaler before exercise. Exercising indoors if it is very cold or humid, or if the pollen and mold counts are high. Warming up and cool down before and after exercise. Stopping exercising right away if your symptoms start or get worse. General instructions If you have asthma, make sure you have an asthma action plan. Stay up to date on your immunizations. Keep all follow-up visits. This is important. Get help right away if: You have trouble breathing. Your wheezing and coughing do not get better after taking your medicine. You have chest pain. You have trouble speaking more than one-word sentences. These symptoms may be an emergency. Get help right away. Call 911. Do not wait to   see if the symptoms will go away. Do not drive yourself to the hospital. Summary Bronchospasm is a  tightening of the smooth muscle that wraps around the small airways in the lungs. Some people have bronchospasm when they exercise. This is called exercise-induced bronchoconstriction (EIB). If you have this problem, talk with your health care provider about how to manage EIB. Do not use any products that contain nicotine or tobacco. These products include cigarettes, chewing tobacco, and vaping devices, such as e-cigarettes. If you need help quitting, ask your health care provider. Get help right away if your wheezing and coughing do not get better after taking your medicine. This information is not intended to replace advice given to you by your health care provider. Make sure you discuss any questions you have with your health care provider. Document Revised: 10/28/2020 Document Reviewed: 10/28/2020 [employer] Patient Education  2023 [employer]  

## 2022-04-28 NOTE — Progress Notes (Signed)
Felicia Robbins is a 67 y.o. female with the following history as recorded in EpicCare:  Patient Active Problem List   Diagnosis Date Noted   Left elbow pain 11/11/2021   Allergic conjunctivitis 09/16/2018   Left groin pain 08/17/2018   IBS (irritable bowel syndrome) 08/17/2018   Left sided abdominal pain 06/27/2018   Constipation 06/27/2018   Colon cancer screening 03/21/2018   Lung nodules 03/21/2018   Osteopenia 03/21/2018   LBBB (left bundle branch block) 01/08/2018   Chest pain 12/17/2017   Palpitations 12/17/2017   Hyperlipidemia 03/17/2017   Elevated glucose level 03/17/2017   Obesity 02/11/2016   Lactose intolerance 02/11/2016   Stress reaction, emotional 01/19/2012   Gynecologic exam normal 12/08/2010   Routine general medical examination at a health care facility 11/29/2010   Hot flashes 08/11/2010   Allergic rhinitis 08/15/2007   FIBROCYSTIC BREAST DISEASE 09/24/2006   HX, PERSONAL, COLONIC POLYPS 09/24/2006   Migraine without aura 08/25/2006   DECREASED HEARING 08/25/2006   Essential hypertension 08/25/2006   Esophageal reflux 08/25/2006   History of migraine headaches 08/25/2006    Current Outpatient Medications  Medication Sig Dispense Refill   albuterol (PROAIR HFA) 108 (90 Base) MCG/ACT inhaler Inhale 1-2 puffs into the lungs every 6 (six) hours as needed for wheezing or shortness of breath. 8 g 1   amoxicillin-clavulanate (AUGMENTIN) 875-125 MG tablet Take 1 tablet by mouth 2 (two) times daily for 10 days. 20 tablet 0   HYDROcodone bit-homatropine (HYCODAN) 5-1.5 MG/5ML syrup Take 5 mLs by mouth every 8 (eight) hours as needed for cough. 120 mL 0   lisinopril (ZESTRIL) 10 MG tablet Take 1 tablet (10 mg total) by mouth daily. 90 tablet 3   metoprolol succinate (TOPROL-XL) 50 MG 24 hr tablet Take 1 tablet (50 mg total) by mouth daily. TAKE WITH OR IMMEDIATELY FOLLOWING A MEAL. 90 tablet 3   predniSONE (DELTASONE) 20 MG tablet Take 1 tablet (20 mg total) by  mouth daily with breakfast. 7 tablet 0   No current facility-administered medications for this visit.    Allergies: Tetracycline  Past Medical History:  Diagnosis Date   Depression    GERD (gastroesophageal reflux disease)    Headache(784.0)    HOH (hard of hearing)    left-has inplant   Left bundle branch block    WPW (Wolff-Parkinson-White syndrome)    See scanned history note    Past Surgical History:  Procedure Laterality Date   BIOPSY BOWEL     small bowel biopsy- normal (colon plyps / colonoscopy 03/1999)   BREAST BIOPSY Left 07/13/2013   Procedure: BREAST BIOPSY WITH NEEDLE LOCALIZATION;  Surgeon: Marcello Moores A. Cornett, MD;  Location: [place];  Service: General;  Laterality: Left;   BREAST SURGERY     Lt breast nodule - neg.   CHOLECYSTECTOMY  04/21/2003   lap choli   COLONOSCOPY  08/29/2018   COLONOSCOPY  02/24/2022   INNER EAR SURGERY  04/20/2009   Multiple surgeries Lt ear. Hearing device implanted Lt ear 2011.   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   TUBAL LIGATION      Family History  Problem Relation Age of Onset   Cancer Mother        breast   Diabetes Mother    Stroke Mother    Multiple sclerosis Mother    Crohn's disease Brother    Cancer Sister 43       breast   Diabetes Brother  Colon cancer Neg Hx    Liver cancer Neg Hx    Rectal cancer Neg Hx    Stomach cancer Neg Hx     Social History   Tobacco Use   Smoking status: Former    Packs/day: 1.00    Years: 15.00    Total pack years: 15.00    Types: Cigarettes    Quit date: 04/20/1986    Years since quitting: 36.0   Smokeless tobacco: Never  Substance Use Topics   Alcohol use: Yes    Alcohol/week: 0.0 standard drinks of alcohol    Comment: 2-3 drinks a week    Subjective:  Seen last week with bronchitis/ concerns for possible pneumonia; imaging was clear; patient was treated with Augmentin ( still has 3 days left) and prednisone; noticing some improvement but  notes cough still present/ problematic; no fevers, night sweats; has used her albuterol at night with some relief;   Objective:  Vitals:   04/28/22 1057  BP: 114/74  Pulse: 70  Resp: 18  Temp: 98.6 F (37 C)  TempSrc: Oral  SpO2: 97%  Weight: 157 lb 6.4 oz (71.4 kg)  Height: '5\' 1"'$  (1.549 m)    General: Well developed, well nourished, in no acute distress  Skin : Warm and dry.  Head: Normocephalic and atraumatic  Eyes: Sclera and conjunctiva clear; pupils round and reactive to light; extraocular movements intact  Ears: External normal; canals clear; tympanic membranes normal  Oropharynx: Pink, supple. No suspicious lesions  Neck: Supple without thyromegaly, adenopathy  Lungs: Respirations unlabored; clear to auscultation bilaterally without wheeze, rales, rhonchi  CVS exam: normal rate and regular rhythm.  Neurologic: Alert and oriented; speech intact; face symmetrical; moves all extremities well; CNII-XII intact without focal deficit   Assessment:  1. Acute bronchospasm     Plan:  Physical exam is much improved this week compared to last; reassurance to patient about good improvement in the sound of her lungs; reviewed CXR and chest CT; will extend prednisone x 7 more days and encouraged to use her albuterol every 6-8 hours for the next 5 days; reassurance that this type of cough may take 4-6 weeks to resolve completely; follow up worse, no better.   No follow-ups on file.  No orders of the defined types were placed in this encounter.   Requested Prescriptions   Signed Prescriptions Disp Refills   predniSONE (DELTASONE) 20 MG tablet 7 tablet 0    Sig: Take 1 tablet (20 mg total) by mouth daily with breakfast.

## 2022-04-29 ENCOUNTER — Other Ambulatory Visit (HOSPITAL_BASED_OUTPATIENT_CLINIC_OR_DEPARTMENT_OTHER): Payer: 59

## 2022-12-28 ENCOUNTER — Telehealth: Payer: 59 | Admitting: Nurse Practitioner

## 2022-12-28 DIAGNOSIS — M25561 Pain in right knee: Secondary | ICD-10-CM

## 2022-12-28 MED ORDER — NAPROXEN 500 MG PO TABS: 500.0000 mg | ORAL_TABLET | Freq: Two times a day (BID) | ORAL | 2 refills | Status: AC

## 2022-12-28 NOTE — Progress Notes (Signed)
Virtual Visit Consent   Felicia Robbins, you are scheduled for a virtual visit with a [facility] provider today. Just as with appointments in the office, your consent must be obtained to participate. Your consent will be active for this visit and any virtual visit you may have with one of our providers in the next 365 days. If you have a MyChart account, a copy of this consent can be sent to you electronically.  As this is a virtual visit, video technology does not allow for your provider to perform a traditional examination. This may limit your provider's ability to fully assess your condition. If your provider identifies any concerns that need to be evaluated in person or the need to arrange testing (such as labs, EKG, etc.), we will make arrangements to do so. Although advances in technology are sophisticated, we cannot ensure that it will always work on either your end or our end. If the connection with a video visit is poor, the visit may have to be switched to a telephone visit. With either a video or telephone visit, we are not always able to ensure that we have a secure connection.  By engaging in this virtual visit, you consent to the provision of healthcare and authorize for your insurance to be billed (if applicable) for the services provided during this visit. Depending on your insurance coverage, you may receive a charge related to this service.  I need to obtain your verbal consent now. Are you willing to proceed with your visit today? Felicia Robbins has provided verbal consent on 12/28/2022 for a virtual visit (video or telephone). Viviano Simas, FNP  Date: 12/28/2022 9:26 AM  Virtual Visit via Video Note   I, Viviano Simas, connected with  Felicia Robbins  ([id number], 66/01/1956) on 12/28/22 at  9:30 AM EDT by a video-enabled telemedicine application and verified that I am speaking with the correct person using two identifiers.  Location: Patient: Virtual Visit Location Patient:  Home Provider: Virtual Visit Location Provider: Home Office   I discussed the limitations of evaluation and management by telemedicine and the availability of in person appointments. The patient expressed understanding and agreed to proceed.    History of Present Illness: Felicia Robbins is a 67 y.o. who identifies as a female who was assigned female at birth, and is being seen today for right knee pain.   For the past few months she has noticed an increase in pain, it is mostly at night and will wake her up.   The pain is mostly painful when she isn't moving historically   4-5 days ago she started to have pain all the time even when walking She did buy some OTC cream without relief  She has tried wearing a knee brace   She noticed some swelling as well recently   She has not taken anything OTC for relief   Denies a known injury   Denies any history of injury to the knee or surgeries in the past    Problems:  Patient Active Problem List   Diagnosis Date Noted   Left elbow pain 11/11/2021   Allergic conjunctivitis 09/16/2018   Left groin pain 08/17/2018   IBS (irritable bowel syndrome) 08/17/2018   Left sided abdominal pain 06/27/2018   Constipation 06/27/2018   Colon cancer screening 03/21/2018   Lung nodules 03/21/2018   Osteopenia 03/21/2018   LBBB (left bundle branch block) 01/08/2018   Chest pain 12/17/2017   Palpitations 12/17/2017  Hyperlipidemia 03/17/2017   Elevated glucose level 03/17/2017   Obesity 02/11/2016   Lactose intolerance 02/11/2016   Stress reaction, emotional 01/19/2012   Gynecologic exam normal 12/08/2010   Routine general medical examination at a health care facility 11/29/2010   Hot flashes 08/11/2010   Allergic rhinitis 08/15/2007   FIBROCYSTIC BREAST DISEASE 09/24/2006   HX, PERSONAL, COLONIC POLYPS 09/24/2006   Migraine without aura 08/25/2006   DECREASED HEARING 08/25/2006   Essential hypertension 08/25/2006   Esophageal reflux  08/25/2006   History of migraine headaches 08/25/2006    Allergies:  Allergies  Allergen Reactions   Tetracycline Other (See Comments)    REACTION: rash, passed out REACTION:  passed out   Medications:  Current Outpatient Medications:    albuterol (PROAIR HFA) 108 (90 Base) MCG/ACT inhaler, Inhale 1-2 puffs into the lungs every 6 (six) hours as needed for wheezing or shortness of breath., Disp: 8 g, Rfl: 1   HYDROcodone bit-homatropine (HYCODAN) 5-1.5 MG/5ML syrup, Take 5 mLs by mouth every 8 (eight) hours as needed for cough., Disp: 120 mL, Rfl: 0   lisinopril (ZESTRIL) 10 MG tablet, Take 1 tablet (10 mg total) by mouth daily., Disp: 90 tablet, Rfl: 3   metoprolol succinate (TOPROL-XL) 50 MG 24 hr tablet, Take 1 tablet (50 mg total) by mouth daily. TAKE WITH OR IMMEDIATELY FOLLOWING A MEAL., Disp: 90 tablet, Rfl: 3   predniSONE (DELTASONE) 20 MG tablet, Take 1 tablet (20 mg total) by mouth daily with breakfast., Disp: 7 tablet, Rfl: 0  Observations/Objective: Patient is well-developed, well-nourished in no acute distress.  Resting comfortably  at home.  Head is normocephalic, atraumatic.  No labored breathing.  Speech is clear and coherent with logical content.  Patient is alert and oriented at baseline.    Assessment and Plan:  1. Acute pain of right knee  Sounds arthritis like in nature, may consider imaging at follow up appointment with PCP  Meds ordered this encounter  Medications   naproxen (NAPROSYN) 500 MG tablet    Sig: Take 1 tablet (500 mg total) by mouth 2 (two) times daily with a meal.    Dispense:  60 tablet    Refill:  2    Continue Vitamin D start Calcium daily/ underlying osteopenia   May take tylenol overnight for pain as discussed  Encouraged knee exercises and possibly PT to discuss with PCP if pain persists  May use knee brace ice or heat for additional relief    Follow Up Instructions: I discussed the assessment and treatment plan with the  patient. The patient was provided an opportunity to ask questions and all were answered. The patient agreed with the plan and demonstrated an understanding of the instructions.  A copy of instructions were sent to the patient via MyChart unless otherwise noted below.    The patient was advised to call back or seek an in-person evaluation if the symptoms worsen or if the condition fails to improve as anticipated.  Time:  I spent 12 minutes with the patient via telehealth technology discussing the above problems/concerns.    Viviano Simas, FNP

## 2023-02-21 ENCOUNTER — Other Ambulatory Visit: Payer: Self-pay | Admitting: Family

## 2023-05-23 ENCOUNTER — Other Ambulatory Visit: Payer: Self-pay | Admitting: Family

## 2023-06-19 ENCOUNTER — Other Ambulatory Visit: Payer: Self-pay | Admitting: Family

## 2023-06-20 ENCOUNTER — Other Ambulatory Visit: Payer: Self-pay | Admitting: Family

## 2023-07-13 ENCOUNTER — Ambulatory Visit (INDEPENDENT_AMBULATORY_CARE_PROVIDER_SITE_OTHER): Admitting: Family

## 2023-07-13 ENCOUNTER — Other Ambulatory Visit (HOSPITAL_BASED_OUTPATIENT_CLINIC_OR_DEPARTMENT_OTHER): Payer: Self-pay

## 2023-07-13 ENCOUNTER — Encounter: Payer: Self-pay | Admitting: Family

## 2023-07-13 VITALS — BP 138/70 | HR 93 | Ht 61.0 in | Wt 161.2 lb

## 2023-07-13 DIAGNOSIS — G43009 Migraine without aura, not intractable, without status migrainosus: Secondary | ICD-10-CM

## 2023-07-13 DIAGNOSIS — Z13220 Encounter for screening for lipoid disorders: Secondary | ICD-10-CM

## 2023-07-13 DIAGNOSIS — M25561 Pain in right knee: Secondary | ICD-10-CM | POA: Diagnosis not present

## 2023-07-13 DIAGNOSIS — R7309 Other abnormal glucose: Secondary | ICD-10-CM | POA: Diagnosis not present

## 2023-07-13 DIAGNOSIS — Z0000 Encounter for general adult medical examination without abnormal findings: Secondary | ICD-10-CM | POA: Diagnosis not present

## 2023-07-13 DIAGNOSIS — G8929 Other chronic pain: Secondary | ICD-10-CM

## 2023-07-13 MED ORDER — METOPROLOL SUCCINATE ER 50 MG PO TB24
50.0000 mg | ORAL_TABLET | Freq: Every day | ORAL | 3 refills | Status: DC
  Filled 2023-07-13: qty 90, 90d supply, fill #0
  Filled 2023-10-15: qty 90, 90d supply, fill #1
  Filled 2024-02-06: qty 90, 90d supply, fill #2
  Filled 2024-04-30: qty 90, 90d supply, fill #3

## 2023-07-13 MED ORDER — LISINOPRIL 10 MG PO TABS
10.0000 mg | ORAL_TABLET | Freq: Every day | ORAL | 3 refills | Status: DC
  Filled 2023-07-13: qty 90, 90d supply, fill #0
  Filled 2023-10-15: qty 90, 90d supply, fill #1
  Filled 2024-02-06: qty 90, 90d supply, fill #2
  Filled 2024-04-30: qty 90, 90d supply, fill #3

## 2023-07-13 MED ORDER — TIZANIDINE HCL 4 MG PO TABS
4.0000 mg | ORAL_TABLET | Freq: Four times a day (QID) | ORAL | 2 refills | Status: DC | PRN
  Filled 2023-07-13: qty 30, 8d supply, fill #0
  Filled 2024-01-04: qty 30, 8d supply, fill #1

## 2023-07-13 NOTE — Patient Instructions (Signed)
 Vaccines to consider:  1) Tdap due every 10 years ( your last one was done in 2014);  2) Shingrix- 2 shots over 2-6 month window; ( pick a Friday and plan to rest for 24 hours afterwards)  3) Prevnar 20 ( one time vaccine)

## 2023-07-13 NOTE — Progress Notes (Signed)
 Felicia Robbins is a 68 y.o. female with the following history as recorded in EpicCare:  Patient Active Problem List   Diagnosis Date Noted   Left elbow pain 11/11/2021   Allergic conjunctivitis 09/16/2018   Left groin pain 08/17/2018   IBS (irritable bowel syndrome) 08/17/2018   Left sided abdominal pain 06/27/2018   Constipation 06/27/2018   Colon cancer screening 03/21/2018   Lung nodules 03/21/2018   Osteopenia 03/21/2018   LBBB (left bundle branch block) 01/08/2018   Chest pain 12/17/2017   Palpitations 12/17/2017   Hyperlipidemia 03/17/2017   Elevated glucose level 03/17/2017   Obesity 02/11/2016   Lactose intolerance 02/11/2016   Stress reaction, emotional 01/19/2012   Gynecologic exam normal 12/08/2010   Routine general medical examination at a health care facility 11/29/2010   Hot flashes 08/11/2010   Allergic rhinitis 08/15/2007   FIBROCYSTIC BREAST DISEASE 09/24/2006   History of colonic polyps 09/24/2006   Migraine without aura 08/25/2006   DECREASED HEARING 08/25/2006   Essential hypertension 08/25/2006   Esophageal reflux 08/25/2006   History of migraine headaches 08/25/2006    Current Outpatient Medications  Medication Sig Dispense Refill   albuterol (PROAIR HFA) 108 (90 Base) MCG/ACT inhaler Inhale 1-2 puffs into the lungs every 6 (six) hours as needed for wheezing or shortness of breath. 8 g 1   tiZANidine (ZANAFLEX) 4 MG tablet Take 1 tablet (4 mg total) by mouth every 6 (six) hours as needed (headache). 30 tablet 2   lisinopril (ZESTRIL) 10 MG tablet Take 1 tablet (10 mg total) by mouth daily. 90 tablet 3   metoprolol succinate (TOPROL-XL) 50 MG 24 hr tablet Take 1 tablet (50 mg total) by mouth daily. TAKE WITH OR IMMEDIATELY FOLLOWING A MEAL. 90 tablet 3   No current facility-administered medications for this visit.    Allergies: Tetracycline  Past Medical History:  Diagnosis Date   Depression    GERD (gastroesophageal reflux disease)     Headache(784.0)    HOH (hard of hearing)    left-has inplant   Left bundle branch block    WPW (Wolff-Parkinson-White syndrome)    See scanned history note    Past Surgical History:  Procedure Laterality Date   BIOPSY BOWEL     small bowel biopsy- normal (colon plyps / colonoscopy 03/1999)   BREAST BIOPSY Left 07/13/2013   Procedure: BREAST BIOPSY WITH NEEDLE LOCALIZATION;  Surgeon: Maisie Fus A. Cornett, MD;  Location: [facility];  Service: General;  Laterality: Left;   BREAST SURGERY     Lt breast nodule - neg.   CHOLECYSTECTOMY  04/21/2003   lap choli   COLONOSCOPY  08/29/2018   COLONOSCOPY  02/24/2022   INNER EAR SURGERY  04/20/2009   Multiple surgeries Lt ear. Hearing device implanted Lt ear 2011.   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   TUBAL LIGATION      Family History  Problem Relation Age of Onset   Cancer Mother        breast   Diabetes Mother    Stroke Mother    Multiple sclerosis Mother    Crohn's disease Brother    Cancer Sister 62       breast   Diabetes Brother    Colon cancer Neg Hx    Liver cancer Neg Hx    Rectal cancer Neg Hx    Stomach cancer Neg Hx     Social History   Tobacco Use   Smoking status: Former  Current packs/day: 0.00    Average packs/day: 1 pack/day for 15.0 years (15.0 ttl pk-yrs)    Types: Cigarettes    Start date: 04/21/1971    Quit date: 04/20/1986    Years since quitting: 37.2   Smokeless tobacco: Never  Substance Use Topics   Alcohol use: Yes    Alcohol/week: 0.0 standard drinks of alcohol    Comment: 2-3 drinks a week    Subjective:   Presents for yearly CPE- notes she has had a very stressful day and feels like this is reason blood pressure is slightly elevated; does not check her pressure regularly;   [facility]- scheduled there in early April 2025 for pap smear/ mammogram/ DEXA;   Review of Systems  Constitutional: Negative.   HENT: Negative.    Eyes: Negative.   Respiratory:  Negative.    Cardiovascular: Negative.   Gastrointestinal: Negative.   Genitourinary: Negative.   Musculoskeletal: Negative.   Skin: Negative.   Neurological: Negative.   Endo/Heme/Allergies: Negative.   Psychiatric/Behavioral: Negative.          Objective:  Vitals:   07/13/23 1430  BP: 138/70  Pulse: 93  SpO2: 97%  Weight: 161 lb 3.2 oz (73.1 kg)  Height: 5\' 1"  (1.549 m)    General: Well developed, well nourished, in no acute distress  Skin : Warm and dry.  Head: Normocephalic and atraumatic  Eyes: Sclera and conjunctiva clear; pupils round and reactive to light; extraocular movements intact  Ears: External normal; canals clear; tympanic membranes normal  Oropharynx: Pink, supple. No suspicious lesions  Neck: Supple without thyromegaly, adenopathy  Lungs: Respirations unlabored; clear to auscultation bilaterally without wheeze, rales, rhonchi  CVS exam: normal rate and regular rhythm.  Abdomen: Soft; nontender; nondistended; normoactive bowel sounds; no masses or hepatosplenomegaly  Musculoskeletal: No deformities; no active joint inflammation  Extremities: No edema, cyanosis, clubbing  Vessels: Symmetric bilaterally  Neurologic: Alert and oriented; speech intact; face symmetrical; moves all extremities well; CNII-XII intact without focal deficit  Assessment:  1. PE (physical exam), annual   2. Chronic pain of right knee   3. Lipid screening   4. Elevated glucose   5. Migraine without aura and without status migrainosus, not intractable     Plan:  Age appropriate preventive healthcare needs addressed; encouraged regular eye doctor and dental exams; encouraged regular exercise; will update refills as needed today; she will return for fasting labs at her convenience;  Patient is encouraged to start checking her blood pressure regularly and follow up if consistently above 140/90;     Return for  follow up for lab appointment.  Orders Placed This Encounter  Procedures    CBC with Differential/Platelet    Standing Status:   Future    Expiration Date:   07/12/2024   Comp Met (CMET)    Standing Status:   Future    Expiration Date:   07/12/2024   Lipid panel    Standing Status:   Future    Expiration Date:   07/12/2024   Hemoglobin A1c    Standing Status:   Future    Expiration Date:   07/12/2024   Ambulatory referral to Orthopedic Surgery    Referral Priority:   Routine    Referral Type:   Surgical    Referral Reason:   Specialty Services Required    Requested Specialty:   Orthopedic Surgery    Number of Visits Requested:   1    Requested Prescriptions   Signed Prescriptions  Disp Refills   lisinopril (ZESTRIL) 10 MG tablet 90 tablet 3    Sig: Take 1 tablet (10 mg total) by mouth daily.   metoprolol succinate (TOPROL-XL) 50 MG 24 hr tablet 90 tablet 3    Sig: Take 1 tablet (50 mg total) by mouth daily. TAKE WITH OR IMMEDIATELY FOLLOWING A MEAL.   tiZANidine (ZANAFLEX) 4 MG tablet 30 tablet 2    Sig: Take 1 tablet (4 mg total) by mouth every 6 (six) hours as needed (headache).

## 2023-07-16 ENCOUNTER — Other Ambulatory Visit (INDEPENDENT_AMBULATORY_CARE_PROVIDER_SITE_OTHER)

## 2023-07-16 DIAGNOSIS — Z13220 Encounter for screening for lipoid disorders: Secondary | ICD-10-CM | POA: Diagnosis not present

## 2023-07-16 DIAGNOSIS — R7309 Other abnormal glucose: Secondary | ICD-10-CM

## 2023-07-16 DIAGNOSIS — Z0000 Encounter for general adult medical examination without abnormal findings: Secondary | ICD-10-CM

## 2023-07-16 LAB — CBC WITH DIFFERENTIAL/PLATELET
Basophils Absolute: 0 10*3/uL (ref 0.0–0.1)
Basophils Relative: 0.7 % (ref 0.0–3.0)
Eosinophils Absolute: 0.3 10*3/uL (ref 0.0–0.7)
Eosinophils Relative: 5.2 % — ABNORMAL HIGH (ref 0.0–5.0)
HCT: 38.9 % (ref 36.0–46.0)
Hemoglobin: 12.8 g/dL (ref 12.0–15.0)
Lymphocytes Relative: 35.3 % (ref 12.0–46.0)
Lymphs Abs: 2.3 10*3/uL (ref 0.7–4.0)
MCHC: 32.9 g/dL (ref 30.0–36.0)
MCV: 96.3 fl (ref 78.0–100.0)
Monocytes Absolute: 0.5 10*3/uL (ref 0.1–1.0)
Monocytes Relative: 8.3 % (ref 3.0–12.0)
Neutro Abs: 3.3 10*3/uL (ref 1.4–7.7)
Neutrophils Relative %: 50.5 % (ref 43.0–77.0)
Platelets: 265 10*3/uL (ref 150.0–400.0)
RBC: 4.04 Mil/uL (ref 3.87–5.11)
RDW: 14 % (ref 11.5–15.5)
WBC: 6.5 10*3/uL (ref 4.0–10.5)

## 2023-07-16 LAB — LIPID PANEL
Cholesterol: 216 mg/dL — ABNORMAL HIGH (ref 0–200)
HDL: 69.4 mg/dL (ref >39.00)
LDL Cholesterol: 125 mg/dL — ABNORMAL HIGH (ref 0–99)
NonHDL: 146.83
Total CHOL/HDL Ratio: 3
Triglycerides: 109 mg/dL (ref 0.0–149.0)
VLDL: 21.8 mg/dL (ref 0.0–40.0)

## 2023-07-16 LAB — COMPREHENSIVE METABOLIC PANEL WITH GFR
ALT: 12 U/L (ref 0–35)
AST: 14 U/L (ref 0–37)
Albumin: 4.3 g/dL (ref 3.5–5.2)
Alkaline Phosphatase: 65 U/L (ref 39–117)
BUN: 19 mg/dL (ref 6–23)
CO2: 30 meq/L (ref 19–32)
Calcium: 9.5 mg/dL (ref 8.4–10.5)
Chloride: 104 meq/L (ref 96–112)
Creatinine, Ser: 0.9 mg/dL (ref 0.40–1.20)
GFR: 66.51 mL/min (ref >60.00)
Glucose, Bld: 99 mg/dL (ref 70–99)
Potassium: 4.8 meq/L (ref 3.5–5.1)
Sodium: 141 meq/L (ref 135–145)
Total Bilirubin: 0.6 mg/dL (ref 0.2–1.2)
Total Protein: 6.8 g/dL (ref 6.0–8.3)

## 2023-07-17 LAB — HEMOGLOBIN A1C: Hgb A1c MFr Bld: 5.5 % (ref 4.6–6.5)

## 2023-07-19 ENCOUNTER — Encounter: Payer: Self-pay | Admitting: Family

## 2023-07-22 LAB — HM MAMMOGRAPHY

## 2023-07-22 LAB — HM DEXA SCAN

## 2023-07-22 LAB — HM PAP SMEAR

## 2023-07-26 ENCOUNTER — Encounter: Payer: Self-pay | Admitting: Orthopedic Surgery

## 2023-07-26 ENCOUNTER — Ambulatory Visit: Admitting: Orthopedic Surgery

## 2023-07-26 ENCOUNTER — Other Ambulatory Visit (INDEPENDENT_AMBULATORY_CARE_PROVIDER_SITE_OTHER): Payer: Self-pay

## 2023-07-26 DIAGNOSIS — M25561 Pain in right knee: Secondary | ICD-10-CM

## 2023-07-26 DIAGNOSIS — M1711 Unilateral primary osteoarthritis, right knee: Secondary | ICD-10-CM

## 2023-07-27 ENCOUNTER — Encounter: Payer: Self-pay | Admitting: Orthopedic Surgery

## 2023-07-27 ENCOUNTER — Telehealth: Payer: Self-pay

## 2023-07-27 NOTE — Progress Notes (Unsigned)
 Office Visit Note   Patient: Felicia Robbins           Date of Birth: 1955/06/14           MRN: [id number] Visit Date: 07/26/2023 Requested by: Olive Bass, FNP [address] [place],  [place] [address] PCP: Olive Bass, FNP  Subjective: Chief Complaint  Patient presents with   Right Knee - Pain    HPI: Felicia Robbins is a 67 y.o. female who presents to the office reporting right knee pain.  Ongoing since September 2024.  She was forcing an object into a box and actually fell directly onto her right knee in the garage.  Pain usually is occurring at night.  Does wake her from sleep.  Denies much in the way of mechanical symptoms.  No prior right knee surgery.  Reports increased pain after activity or with prolonged walking.  The pain is primarily in the patellar region.  Takes Aleve.  Denies any low back pain or groin pain or radicular symptoms..                ROS: All systems reviewed are negative as they relate to the chief complaint within the history of present illness.  Patient denies fevers or chills.  Assessment & Plan: Visit Diagnoses:  1. Right knee pain, unspecified chronicity     Plan: Impression is right knee pain.  Radiographs negative for fracture.  She does have a fair amount of medial compartment arthritis in the knee.  I think the fall likely exacerbated some pre-existing arthritis.  Plan at this time is injection into the right knee with 6-week return for clinical recheck.  I think gel injection would potentially be indicated as well to try to delay her need for knee replacement. This patient is diagnosed with osteoarthritis of the knee(s).    Radiographs show evidence of joint space narrowing, osteophytes, subchondral sclerosis and/or subchondral cysts.  This patient has knee pain which interferes with functional and activities of daily living.    This patient has experienced inadequate response, adverse effects and/or  intolerance with conservative treatments such as acetaminophen, NSAIDS, topical creams, physical therapy or regular exercise, knee bracing and/or weight loss.   This patient has experienced inadequate response or has a contraindication to intra articular steroid injections for at least 3 months.   This patient is not scheduled to have a total knee replacement within 6 months of starting treatment with viscosupplementation.   Follow-Up Instructions: No follow-ups on file.   Orders:  Orders Placed This Encounter  Procedures   XR KNEE 3 VIEW RIGHT   No orders of the defined types were placed in this encounter.     Procedures: Large Joint Inj: R knee on 07/26/2023 10:08 PM Indications: diagnostic evaluation, joint swelling and pain Details: 18 G 1.5 in needle, superolateral approach  Arthrogram: No  Medications: 5 mL lidocaine 1 %; 40 mg methylPREDNISolone acetate 40 MG/ML; 4 mL bupivacaine 0.25 % Outcome: tolerated well, no immediate complications Procedure, treatment alternatives, risks and benefits explained, specific risks discussed. Consent was given by the patient. Immediately prior to procedure a time out was called to verify the correct patient, procedure, equipment, support staff and site/side marked as required. Patient was prepped and draped in the usual sterile fashion.       Clinical Data: No additional findings.  Objective: Vital Signs: LMP 11/07/2010   Physical Exam:  Constitutional: Patient appears well-developed HEENT:  Head:  Normocephalic Eyes:EOM are normal Neck: Normal range of motion Cardiovascular: Normal rate Pulmonary/chest: Effort normal Neurologic: Patient is alert Skin: Skin is warm Psychiatric: Patient has normal mood and affect  Ortho Exam: Ortho exam demonstrates normal gait and alignment.  Patient has good range of motion of the right knee with near full extension and flexion to 120.  Does have some PERI retinacular tenderness with stable  collateral cruciate ligaments.  Mild patellofemoral crepitus is present.  Extensor mechanism is intact.  No groin pain with internal/external Tatian of the leg.  No masses lymphadenopathy or skin changes noted in that right knee region.  Specialty Comments:  No specialty comments available.  Imaging: XR KNEE 3 VIEW RIGHT Result Date: 07/27/2023 AP lateral merchant radiographs right knee reviewed.  Moderate to severe medial compartment arthritis is present with near bone-on-bone changes seen on the lateral view.  Patellofemoral arthritis is moderate.  Lateral compartment arthritis mild.  No acute fracture.  Alignment intact.    PMFS History: Patient Active Problem List   Diagnosis Date Noted   Left elbow pain 11/11/2021   Allergic conjunctivitis 09/16/2018   Left groin pain 08/17/2018   IBS (irritable bowel syndrome) 08/17/2018   Left sided abdominal pain 06/27/2018   Constipation 06/27/2018   Colon cancer screening 03/21/2018   Lung nodules 03/21/2018   Osteopenia 03/21/2018   LBBB (left bundle branch block) 01/08/2018   Chest pain 12/17/2017   Palpitations 12/17/2017   Hyperlipidemia 03/17/2017   Elevated glucose level 03/17/2017   Obesity 02/11/2016   Lactose intolerance 02/11/2016   Stress reaction, emotional 01/19/2012   Gynecologic exam normal 12/08/2010   Routine general medical examination at a health care facility 11/29/2010   Hot flashes 08/11/2010   Allergic rhinitis 08/15/2007   FIBROCYSTIC BREAST DISEASE 09/24/2006   History of colonic polyps 09/24/2006   Migraine without aura 08/25/2006   DECREASED HEARING 08/25/2006   Essential hypertension 08/25/2006   Esophageal reflux 08/25/2006   History of migraine headaches 08/25/2006   Past Medical History:  Diagnosis Date   Depression    GERD (gastroesophageal reflux disease)    Headache(784.0)    HOH (hard of hearing)    left-has inplant   Left bundle branch block    WPW (Wolff-Parkinson-White syndrome)    See  scanned history note    Family History  Problem Relation Age of Onset   Cancer Mother        breast   Diabetes Mother    Stroke Mother    Multiple sclerosis Mother    Crohn's disease Brother    Cancer Sister 58       breast   Diabetes Brother    Colon cancer Neg Hx    Liver cancer Neg Hx    Rectal cancer Neg Hx    Stomach cancer Neg Hx     Past Surgical History:  Procedure Laterality Date   BIOPSY BOWEL     small bowel biopsy- normal (colon plyps / colonoscopy 03/1999)   BREAST BIOPSY Left 07/13/2013   Procedure: BREAST BIOPSY WITH NEEDLE LOCALIZATION;  Surgeon: Maisie Fus A. Cornett, MD;  Location: [facility];  Service: General;  Laterality: Left;   BREAST SURGERY     Lt breast nodule - neg.   CHOLECYSTECTOMY  04/21/2003   lap choli   COLONOSCOPY  08/29/2018   COLONOSCOPY  02/24/2022   INNER EAR SURGERY  04/20/2009   Multiple surgeries Lt ear. Hearing device implanted Lt ear 2011.   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

## 2023-07-27 NOTE — Telephone Encounter (Signed)
Auth needed for right knee gel  

## 2023-07-28 MED ORDER — LIDOCAINE HCL 1 % IJ SOLN
5.0000 mL | INTRAMUSCULAR | Status: AC | PRN
  Administered 2023-07-26: 5 mL

## 2023-07-28 MED ORDER — BUPIVACAINE HCL 0.25 % IJ SOLN
4.0000 mL | INTRAMUSCULAR | Status: AC | PRN
  Administered 2023-07-26: 4 mL via INTRA_ARTICULAR

## 2023-07-28 MED ORDER — METHYLPREDNISOLONE ACETATE 40 MG/ML IJ SUSP
40.0000 mg | INTRAMUSCULAR | Status: AC | PRN
  Administered 2023-07-26: 40 mg via INTRA_ARTICULAR

## 2023-08-13 NOTE — Telephone Encounter (Signed)
 VOB submitted for Monovisc, right knee

## 2023-10-15 ENCOUNTER — Emergency Department (HOSPITAL_BASED_OUTPATIENT_CLINIC_OR_DEPARTMENT_OTHER)

## 2023-10-15 ENCOUNTER — Emergency Department (HOSPITAL_BASED_OUTPATIENT_CLINIC_OR_DEPARTMENT_OTHER): Admission: EM | Admit: 2023-10-15 | Discharge: 2023-10-15 | Discharge status: Against Medical Advice

## 2023-10-15 ENCOUNTER — Other Ambulatory Visit: Payer: Self-pay

## 2023-10-15 ENCOUNTER — Ambulatory Visit
Admission: RE | Admit: 2023-10-15 | Discharge: 2023-10-15 | Disposition: A | Discharge status: Other Acute Inpatient Hospital | Source: Ambulatory Visit

## 2023-10-15 ENCOUNTER — Encounter (HOSPITAL_BASED_OUTPATIENT_CLINIC_OR_DEPARTMENT_OTHER): Payer: Self-pay

## 2023-10-15 VITALS — BP 119/75 | HR 69 | Temp 98.0°F | Resp 16

## 2023-10-15 DIAGNOSIS — R110 Nausea: Secondary | ICD-10-CM | POA: Insufficient documentation

## 2023-10-15 DIAGNOSIS — I10 Essential (primary) hypertension: Secondary | ICD-10-CM | POA: Diagnosis not present

## 2023-10-15 DIAGNOSIS — R82998 Other abnormal findings in urine: Secondary | ICD-10-CM

## 2023-10-15 DIAGNOSIS — R42 Dizziness and giddiness: Secondary | ICD-10-CM | POA: Insufficient documentation

## 2023-10-15 DIAGNOSIS — Z79899 Other long term (current) drug therapy: Secondary | ICD-10-CM | POA: Insufficient documentation

## 2023-10-15 DIAGNOSIS — R197 Diarrhea, unspecified: Secondary | ICD-10-CM | POA: Insufficient documentation

## 2023-10-15 DIAGNOSIS — Z5329 Procedure and treatment not carried out because of patient's decision for other reasons: Secondary | ICD-10-CM | POA: Diagnosis not present

## 2023-10-15 LAB — CBC WITH DIFFERENTIAL/PLATELET
Abs Immature Granulocytes: 0.04 10*3/uL (ref 0.00–0.07)
Basophils Absolute: 0 10*3/uL (ref 0.0–0.1)
Basophils Relative: 0 %
Eosinophils Absolute: 0.1 10*3/uL (ref 0.0–0.5)
Eosinophils Relative: 1 %
HCT: 34.5 % — ABNORMAL LOW (ref 36.0–46.0)
Hemoglobin: 11.6 g/dL — ABNORMAL LOW (ref 12.0–15.0)
Immature Granulocytes: 1 %
Lymphocytes Relative: 22 %
Lymphs Abs: 2 10*3/uL (ref 0.7–4.0)
MCH: 31.5 pg (ref 26.0–34.0)
MCHC: 33.6 g/dL (ref 30.0–36.0)
MCV: 93.8 fL (ref 80.0–100.0)
Monocytes Absolute: 0.7 10*3/uL (ref 0.1–1.0)
Monocytes Relative: 8 %
Neutro Abs: 5.9 10*3/uL (ref 1.7–7.7)
Neutrophils Relative %: 68 %
Platelets: 227 10*3/uL (ref 150–400)
RBC: 3.68 MIL/uL — ABNORMAL LOW (ref 3.87–5.11)
RDW: 12.9 % (ref 11.5–15.5)
WBC: 8.8 10*3/uL (ref 4.0–10.5)
nRBC: 0 % (ref 0.0–0.2)

## 2023-10-15 LAB — POCT URINALYSIS DIP (MANUAL ENTRY)
Bilirubin, UA: NEGATIVE
Glucose, UA: NEGATIVE mg/dL
Ketones, POC UA: NEGATIVE mg/dL
Nitrite, UA: NEGATIVE
Protein Ur, POC: NEGATIVE mg/dL
Spec Grav, UA: 1.01 (ref 1.010–1.025)
Urobilinogen, UA: 0.2 U/dL
pH, UA: 6 (ref 5.0–8.0)

## 2023-10-15 LAB — POCT FASTING CBG KUC MANUAL ENTRY: POCT Glucose (KUC): 125 mg/dL — AB (ref 70–99)

## 2023-10-15 LAB — COMPREHENSIVE METABOLIC PANEL WITH GFR
ALT: 17 U/L (ref 0–44)
AST: 21 U/L (ref 15–41)
Albumin: 4.1 g/dL (ref 3.5–5.0)
Alkaline Phosphatase: 70 U/L (ref 38–126)
Anion gap: 11 (ref 5–15)
BUN: 19 mg/dL (ref 8–23)
CO2: 24 mmol/L (ref 22–32)
Calcium: 9.7 mg/dL (ref 8.9–10.3)
Chloride: 104 mmol/L (ref 98–111)
Creatinine, Ser: 0.8 mg/dL (ref 0.44–1.00)
GFR, Estimated: >60 mL/min (ref >60)
Glucose, Bld: 102 mg/dL — ABNORMAL HIGH (ref 70–99)
Potassium: 4.3 mmol/L (ref 3.5–5.1)
Sodium: 140 mmol/L (ref 135–145)
Total Bilirubin: 0.5 mg/dL (ref 0.0–1.2)
Total Protein: 6.7 g/dL (ref 6.5–8.1)

## 2023-10-15 LAB — CBG MONITORING, ED: Glucose-Capillary: 108 mg/dL — ABNORMAL HIGH (ref 70–99)

## 2023-10-15 MED ORDER — ONDANSETRON HCL 4 MG/2ML IJ SOLN
4.0000 mg | Freq: Once | INTRAMUSCULAR | Status: AC
  Administered 2023-10-15: 4 mg via INTRAVENOUS
  Filled 2023-10-15: qty 2

## 2023-10-15 MED ORDER — IOHEXOL 350 MG/ML SOLN
75.0000 mL | Freq: Once | INTRAVENOUS | Status: AC | PRN
  Administered 2023-10-15: 75 mL via INTRAVENOUS

## 2023-10-15 MED ORDER — MECLIZINE HCL 25 MG PO TABS
50.0000 mg | ORAL_TABLET | Freq: Once | ORAL | Status: AC
  Administered 2023-10-15: 50 mg via ORAL
  Filled 2023-10-15: qty 2

## 2023-10-15 NOTE — ED Provider Notes (Cosign Needed Addendum)
 [place] [facility] Provider Note   CSN: [id number] Arrival date & time: 10/15/23  1226     Patient presents with: Dizziness   Felicia Robbins is a 68 y.o. female with history of hypertension, IBS, presents with concern for episodic dizziness that started yesterday morning.  States she woke up at about 5 AM to go to the restroom, and felt a lightheaded sensation when she got up.  This sensation resolved until about 12 PM yesterday when her lightheadedness returned and lasted for couple hours.  This was associated with nausea but no vomiting.  Denies any vision changes or room spinning.  Denies any unilateral weakness, slurred speech, or headache.  She went to bed and woke up this morning again with her dizziness which prompted her to seek evaluation.  Denies any chest pain or shortness of breath.  She does report 6 episodes of nonbloody diarrhea yesterday, and about 3 episodes of diarrhea today.  She normally has diarrhea with her IBS, but usually not as many episodes as she has had in the past 2 days.  Denies any abdominal pain, recent travel, recent antibiotic use.    Dizziness      Prior to Admission medications   Medication Sig Start Date End Date Taking? Authorizing Provider  albuterol  (PROAIR  HFA) 108 (90 Base) MCG/ACT inhaler Inhale 1-2 puffs into the lungs every 6 (six) hours as needed for wheezing or shortness of breath. 12/05/21   Jason Leita Repine, FNP  lisinopril  (ZESTRIL ) 10 MG tablet Take 1 tablet (10 mg total) by mouth daily. 07/13/23   Jason Leita Repine, FNP  metoprolol  succinate (TOPROL -XL) 50 MG 24 hr tablet Take 1 tablet (50 mg total) by mouth daily. TAKE WITH OR IMMEDIATELY FOLLOWING A MEAL. 07/13/23   Jason Leita Repine, FNP  tiZANidine  (ZANAFLEX ) 4 MG tablet Take 1 tablet (4 mg total) by mouth every 6 (six) hours as needed (headache). 07/13/23   Jason Leita Repine, FNP    Allergies: Tetracycline    Review of  Systems  Neurological:  Positive for dizziness.    Updated Vital Signs BP (!) 141/71   Pulse 70   Temp 98.4 F (36.9 C)   Resp (!) 22   Wt 72.6 kg   LMP 11/07/2010   SpO2 99%   BMI 30.23 kg/m   Physical Exam Vitals and nursing note reviewed.  Constitutional:      General: She is not in acute distress.    Appearance: She is well-developed. She is not ill-appearing.  HENT:     Head: Normocephalic and atraumatic.   Eyes:     Conjunctiva/sclera: Conjunctivae normal.    Cardiovascular:     Rate and Rhythm: Normal rate and regular rhythm.     Heart sounds: No murmur heard. Pulmonary:     Effort: Pulmonary effort is normal. No respiratory distress.     Breath sounds: Normal breath sounds.  Abdominal:     Palpations: Abdomen is soft.     Tenderness: There is no abdominal tenderness.   Musculoskeletal:        General: No swelling.     Cervical back: Neck supple.   Skin:    General: Skin is warm and dry.     Capillary Refill: Capillary refill takes less than 2 seconds.   Neurological:     General: No focal deficit present.     Mental Status: She is alert and oriented to person, place, and time.  Comments: Mental status: Alert and oriented to self, place, and month  Speech: Answers questions appropriately  Cranial Nerves: III, IV, VI: EOM intact, Pupils equal round and reactive, no gaze preference or deviation, no nystagmus. V: normal sensation in V1, V2, and V3 segments bilaterally VII: smiles, puffs cheeks, raises eyebrows, and closes eyes without asymmetry.  VIII: normal hearing to speech IX, X: normal palatal elevation, no uvular deviation XI: 5/5 head turn and 5/5 shoulder shrug bilaterally XII: midline tongue protrusion  Motor: 5/5 strength with resisted elbow flexion extension, wrist flexion and extension bilaterally. 5/5 strength with resisted ankle plantarflexion and dorsiflexion bilaterally   Sensory: Intact sensation in upper and lower extremity  bilaterally   Coordination: Normal finger to nose and heel to shin, no tremor, no dysmetria  Psychiatric:        Mood and Affect: Mood normal.     (all labs ordered are listed, but only abnormal results are displayed) Labs Reviewed  CBC WITH DIFFERENTIAL/PLATELET - Abnormal; Notable for the following components:      Result Value   RBC 3.68 (*)    Hemoglobin 11.6 (*)    HCT 34.5 (*)    All other components within normal limits  COMPREHENSIVE METABOLIC PANEL WITH GFR - Abnormal; Notable for the following components:   Glucose, Bld 102 (*)    All other components within normal limits  CBG MONITORING, ED - Abnormal; Notable for the following components:   Glucose-Capillary 108 (*)    All other components within normal limits    EKG: None  Radiology: CT Angio Head W or Wo Contrast Result Date: 10/15/2023 CLINICAL DATA:  Dizziness. EXAM: CT ANGIOGRAPHY HEAD TECHNIQUE: Multidetector CT imaging of the head was performed using the standard protocol during bolus administration of intravenous contrast. Multiplanar CT image reconstructions and MIPs were obtained to evaluate the vascular anatomy. RADIATION DOSE REDUCTION: This exam was performed according to the departmental dose-optimization program which includes automated exposure control, adjustment of the mA and/or kV according to patient size and/or use of iterative reconstruction technique. CONTRAST:  75mL OMNIPAQUE  IOHEXOL  350 MG/ML SOLN COMPARISON:  CT maxillofacial 01/13/2017.  CT head 11/13/2003. FINDINGS: CT HEAD Brain: Extensive streak artifact from the patient's cochlear implant obscures large portions of the posterior fossa and posterior and inferior aspects of the left cerebral hemisphere. Within this limitation, no acute large territory infarct, intracranial hemorrhage, mass, midline shift, or extra-axial fluid collection is identified. Cerebral volume is normal. The ventricles are normal in size. Vascular: Calcified atherosclerosis  at the skull base. Skull: No fracture or suspicious lesion. Sinuses: Left mastoidectomy. Small mucous retention cyst in the right maxillary sinus. Other: Unremarkable orbits. CTA HEAD Anterior circulation: The internal carotid arteries are patent from skull base to carotid termini with mild atherosclerotic calcification bilaterally not resulting in significant stenosis. An infundibulum is noted at the origin of the left posterior communicating artery. ACAs and MCAs are patent without evidence of a proximal branch occlusion or significant proximal stenosis. No aneurysm is identified. Posterior circulation: The included distal vertebral arteries are widely patent to the basilar and codominant. Patent right PICA, left AICA, and bilateral SCA origins are visualized. The basilar artery is widely patent with an incidental fenestration noted proximally. There are moderate-sized right and small left posterior communicating arteries. Both PCAs are patent without evidence of a significant proximal stenosis. No aneurysm is identified. Venous sinuses: As permitted by contrast timing, patent. Streak artifact limits assessment of the left sigmoid sinus. Anatomic variants: None. Review  of the MIP images confirms the above findings. IMPRESSION: 1. Mild intracranial atherosclerosis without a large vessel occlusion or significant proximal stenosis. 2. No evidence of an acute intracranial abnormality on the noncontrast head CT within limitation of streak artifact. Electronically Signed   By: Dasie Hamburg M.D.   On: 10/15/2023 15:21     Procedures   Medications Ordered in the ED  meclizine  (ANTIVERT ) tablet 50 mg (50 mg Oral Given 10/15/23 1306)  ondansetron  (ZOFRAN ) injection 4 mg (4 mg Intravenous Given 10/15/23 1306)  iohexol  (OMNIPAQUE ) 350 MG/ML injection 75 mL (75 mLs Intravenous Contrast Given 10/15/23 1447)    Clinical Course as of 10/15/23 1632  Fri Oct 15, 2023  1411 Patient reevaluated, still reports dizziness.   Able to ambulate to the bathroom without difficulty.  Discussed case with Dr. Neysa who recommends obtaining CT head and CTA head for further evaluation. [AF]    Clinical Course User Index [AF] Veta Palma, PA-C                                 Medical Decision Making Amount and/or Complexity of Data Reviewed Labs: ordered. Radiology: ordered.  Risk Prescription drug management.     Differential diagnosis includes but is not limited to BPPV, Mnire's disease, intracranial mass, electrolyte abnormality, anemia, dehydration, gastritis, IBS  ED Course:  Upon initial evaluation, patient is well-appearing, no acute distress.  Stable vitals.  Reporting a dizziness sensation currently, but reports this is slightly better than it was this morning.  No neurodeficits on exam.  No active vomiting, but does report nausea.  CBG upon arrival 108  Labs Ordered: I Ordered, and personally interpreted labs.  The pertinent results include:   CBG 108 CBC with hemoglobin 11.6.  No leukocytosis CMP without electrolyte abnormality.  No elevation in creatinine or LFTs  Imaging Studies ordered: I ordered imaging studies including CT angio head  I independently visualized the imaging with scope of interpretation limited to determining acute life threatening conditions related to emergency care. Imaging showed  Mild intracranial atherosclerosis without a large vessel occlusion or significant proximal stenosis.  No evidence of an acute intracranial abnormality on the noncontrast head CT with notation of streak artifact I agree with the radiologist interpretation   Cardiac Monitoring: / EKG: The patient was maintained on a cardiac monitor.  I personally viewed and interpreted the cardiac monitored which showed an underlying rhythm of: EKG with sinus bradycardia with rate in the upper 50s and 60s.  No ST changes   Consultations Obtained: I requested consultation with the neurology Dr. Merrianne,  and  discussed lab and imaging findings as well as pertinent plan - they felt that this was unlikely a stroke, but recommended CTA of the neck for further evaluation.  Medications Given: Meclizine  Zofran   12:11 PM upon re-evaluation, patient still states she feels somewhat dizzy, but remains without neuro deficits. No worsening of dizziness.   I personally ambulated patient, she was able to walk around the room without difficulty, but reports feeling slightly off balance.  CBC with mild anemia 11.6, but do not feel like this would be making patient dizzy.  CMP without electrolyte abnormality.  CBG 108.  I discussed this with my attending Dr. Neysa, he recommends CTA head for further evaluation. 4:28 PM patient reports feeling less dizzy on reevaluation.  Still able to ambulate without difficulty and remains without neuro deficits.  CTA head without acute  abnormality to explain her symptoms.  I discussed this with neurology Dr. Merrianne who recommended a CTA neck to rule out vascular abnormality downstream that could be causing her symptoms.  I discussed this recommendation with the patient, and she states she does not want to wait any longer for another scan.  I discussed the risks of leaving AMA with the patient including possible worsening of symptoms, disability, death.  Patient has decision making capacity. Patient verbalized understanding of these risks and wishes to leave.    Impression: Dizziness  Disposition:  Discharged AMA - Pt was competent to make medical decisions and patient was thoroughly explained alternative treatments and risks and understands these. Return precautions given.  This chart was dictated using voice recognition software, Dragon. Despite the best efforts of this provider to proofread and correct errors, errors may still occur which can change documentation meaning.       Final diagnoses:  Dizziness    ED Discharge Orders     None          Veta Palma,  PA-C 10/15/23 1632    Veta Palma, PA-C 10/15/23 1633    Neysa Caron PARAS, DO 10/16/23 1134

## 2023-10-15 NOTE — ED Provider Notes (Signed)
 [facility]    CSN: [id number] Arrival date & time: 10/15/23  1106      History   Chief Complaint Chief Complaint  Patient presents with   Dizziness   Nausea    HPI Felicia Robbins is a 68 y.o. female presents for dizziness.  Patient has a past medical history of WPW, hypertension, IBS, hyperlipidemia, migraines, left bundle branch block.  Patient reports yesterday she awoke at 5:30 in the morning with dizziness.  Went to the restroom and then went back to bed.  When she woke up a couple hours later she continued to have dizziness that seem to have worsened.  She states it got so bad she could barely stand up.  She is not able to differentiate if she felt like she was spinning or the room is spinning.  The dizziness has persisted since that time.  She does endorse nausea without vomiting.  Does report a couple times she laid down thinking that she may have had a fall but denies any actual syncope.  Denies any headache, visual changes, neck pain, vomiting, unilateral weakness.  Has been having diarrhea but states she has a history of IBS and this is not uncommon for her.  States she has been staying hydrated and denies dysuria.  No fevers or chills.  Reports history of multiple surgeries on her left TM and does have hearing issues on that side.  States she did have some tinnitus yesterday as well that has since resolved.  States she has had tinnitus in the past but this felt different to her.  Denies any history of vertigo.  No URI symptoms.  She cannot identify any aggravating or alleviating factors.  Denies any new medications.  No OTC medications have been used since onset.  No other concerns at this time.   Dizziness Associated symptoms: nausea     Past Medical History:  Diagnosis Date   Depression    GERD (gastroesophageal reflux disease)    Headache(784.0)    HOH (hard of hearing)    left-has inplant   Left bundle branch block    WPW (Wolff-Parkinson-White syndrome)     See scanned history note    Patient Active Problem List   Diagnosis Date Noted   Left elbow pain 11/11/2021   Allergic conjunctivitis 09/16/2018   Left groin pain 08/17/2018   IBS (irritable bowel syndrome) 08/17/2018   Left sided abdominal pain 06/27/2018   Constipation 06/27/2018   Colon cancer screening 03/21/2018   Lung nodules 03/21/2018   Osteopenia 03/21/2018   LBBB (left bundle branch block) 01/08/2018   Chest pain 12/17/2017   Palpitations 12/17/2017   Hyperlipidemia 03/17/2017   Elevated glucose level 03/17/2017   Obesity 02/11/2016   Lactose intolerance 02/11/2016   Stress reaction, emotional 01/19/2012   Gynecologic exam normal 12/08/2010   Routine general medical examination at a health care facility 11/29/2010   Hot flashes 08/11/2010   Allergic rhinitis 08/15/2007   FIBROCYSTIC BREAST DISEASE 09/24/2006   History of colonic polyps 09/24/2006   Migraine without aura 08/25/2006   DECREASED HEARING 08/25/2006   Essential hypertension 08/25/2006   Esophageal reflux 08/25/2006   History of migraine headaches 08/25/2006    Past Surgical History:  Procedure Laterality Date   BIOPSY BOWEL     small bowel biopsy- normal (colon plyps / colonoscopy 03/1999)   BREAST BIOPSY Left 07/13/2013   Procedure: BREAST BIOPSY WITH NEEDLE LOCALIZATION;  Surgeon: Debby A. Cornett, MD;  Location: [facility];  Service: General;  Laterality: Left;   BREAST SURGERY     Lt breast nodule - neg.   CHOLECYSTECTOMY  04/21/2003   lap choli   COLONOSCOPY  08/29/2018   COLONOSCOPY  02/24/2022   INNER EAR SURGERY  04/20/2009   Multiple surgeries Lt ear. Hearing device implanted Lt ear 2011.   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      Home Medications    Prior to Admission medications   Medication Sig Start Date End Date Taking? Authorizing Provider  albuterol  (PROAIR  HFA) 108 (90  Base) MCG/ACT inhaler Inhale 1-2 puffs into the lungs every 6 (six) hours as needed for wheezing or shortness of breath. 12/05/21   Jason Leita Repine, FNP  lisinopril  (ZESTRIL ) 10 MG tablet Take 1 tablet (10 mg total) by mouth daily. 07/13/23   Jason Leita Repine, FNP  metoprolol  succinate (TOPROL -XL) 50 MG 24 hr tablet Take 1 tablet (50 mg total) by mouth daily. TAKE WITH OR IMMEDIATELY FOLLOWING A MEAL. 07/13/23   Jason Leita Repine, FNP  tiZANidine  (ZANAFLEX ) 4 MG tablet Take 1 tablet (4 mg total) by mouth every 6 (six) hours as needed (headache). 07/13/23   Jason Leita Repine, FNP    Family History Family History  Problem Relation Age of Onset   Cancer Mother        breast   Diabetes Mother    Stroke Mother    Multiple sclerosis Mother    Crohn's disease Brother    Cancer Sister 49       breast   Diabetes Brother    Colon cancer Neg Hx    Liver cancer Neg Hx    Rectal cancer Neg Hx    Stomach cancer Neg Hx     Social History Social History   Tobacco Use   Smoking status: Former    Current packs/day: 0.00    Average packs/day: 1 pack/day for 15.0 years (15.0 ttl pk-yrs)    Types: Cigarettes    Start date: 04/21/1971    Quit date: 04/20/1986    Years since quitting: 37.5   Smokeless tobacco: Never  Vaping Use   Vaping status: Never Used  Substance Use Topics   Alcohol use: Yes    Alcohol/week: 10.0 standard drinks of alcohol    Types: 10 Cans of beer per week   Drug use: No     Allergies   Tetracycline   Review of Systems Review of Systems  Gastrointestinal:  Positive for nausea.  Neurological:  Positive for dizziness.     Physical Exam Triage Vital Signs ED Triage Vitals  Encounter Vitals Group     BP 10/15/23 1121 119/75     Girls Systolic BP Percentile --      Girls Diastolic BP Percentile --      Boys Systolic BP Percentile --      Boys Diastolic BP Percentile --      Pulse Rate 10/15/23 1121 69     Resp 10/15/23 1121 16     Temp  10/15/23 1121 98 F (36.7 C)     Temp Source 10/15/23 1121 Oral     SpO2 10/15/23 1121 96 %     Weight --      Height --      Head Circumference --      Peak Flow --  Pain Score 10/15/23 1119 0     Pain Loc --      Pain Education --      Exclude from Growth Chart --    Orthostatic VS for the past 24 hrs:  BP- Lying Pulse- Lying BP- Sitting Pulse- Sitting BP- Standing at 0 minutes Pulse- Standing at 0 minutes  10/15/23 1144 113/73 54 101/68 66 103/63 73    Updated Vital Signs BP 119/75   Pulse 69   Temp 98 F (36.7 C) (Oral)   Resp 16   LMP 11/07/2010   SpO2 96%   Visual Acuity Right Eye Distance:   Left Eye Distance:   Bilateral Distance:    Right Eye Near:   Left Eye Near:    Bilateral Near:     Physical Exam Vitals and nursing note reviewed.  Constitutional:      General: She is not in acute distress.    Appearance: Normal appearance. She is not ill-appearing, toxic-appearing or diaphoretic.  HENT:     Head: Normocephalic and atraumatic.     Right Ear: Tympanic membrane and ear canal normal.     Left Ear: Ear canal normal. Tympanic membrane is scarred.     Nose: Nose normal.   Eyes:     Extraocular Movements: Extraocular movements intact.     Conjunctiva/sclera: Conjunctivae normal.     Pupils: Pupils are equal, round, and reactive to light.    Cardiovascular:     Rate and Rhythm: Normal rate and regular rhythm.     Heart sounds: Normal heart sounds.  Pulmonary:     Effort: Pulmonary effort is normal.     Breath sounds: Normal breath sounds.   Skin:    General: Skin is warm and dry.   Neurological:     General: No focal deficit present.     Mental Status: She is alert and oriented to person, place, and time.     GCS: GCS eye subscore is 4. GCS verbal subscore is 5. GCS motor subscore is 6.     Cranial Nerves: No facial asymmetry.     Motor: No weakness, tremor or pronator drift.     Coordination: Romberg sign positive.     Gait: Tandem walk  abnormal.   Psychiatric:        Mood and Affect: Mood normal.        Behavior: Behavior normal.      UC Treatments / Results  Labs (all labs ordered are listed, but only abnormal results are displayed) Labs Reviewed  POCT URINALYSIS DIP (MANUAL ENTRY) - Abnormal; Notable for the following components:      Result Value   Blood, UA small (*)    Leukocytes, UA Small (1+) (*)    All other components within normal limits  POCT FASTING CBG [facility] MANUAL ENTRY - Abnormal; Notable for the following components:   POCT Glucose ([facility]) 125 (*)    All other components within normal limits  URINE CULTURE    EKG   Radiology No results found.  Procedures ED EKG  Date/Time: 10/15/2023 11:58 AM  Performed by: Loreda Myla SAUNDERS, NP Authorized by: Loreda Myla SAUNDERS, NP   ECG interpreted by ED Physician in the absence of a cardiologist: no   Rate:    ECG rate:  54   ECG rate assessment: bradycardic   Rhythm:    Rhythm: sinus bradycardia   QRS:    QRS intervals:  Normal ST segments:    ST segments:  Non-specific T waves:    T waves: flattening    (including critical care time)  Medications Ordered in UC Medications - No data to display  Initial Impression / Assessment and Plan / UC Course  I have reviewed the triage vital signs and the nursing notes.  Pertinent labs & imaging results that were available during my care of the patient were reviewed by me and considered in my medical decision making (see chart for details).     Reviewed exam and symptoms with patient.  Orthostatics blood pressures normal.  Blood sugar 125.  EKG shows sinus bradycardia heart rate of 54 with flattened T waves.  Does appear different than her most recent EKG that was 2 years ago as that showed WPW.  Urine blood and trace leuks but patient is asymptomatic, will culture.  Discussed multiple causes of her dizziness including neurological as well as potential vertigo, however, but given her age and symptoms I advise  she go to the ER for further workup to rule out CVA.  She is in agreement plan will go to the emergency room. Final Clinical Impressions(s) / UC Diagnoses   Final diagnoses:  Dizziness  Leukocytes in urine     Discharge Instructions      My medical advice is that you go to emergency room for further evaluation of your dizziness     ED Prescriptions   None    PDMP not reviewed this encounter.   Loreda Myla SAUNDERS, NP 10/15/23 1214

## 2023-10-15 NOTE — Discharge Instructions (Addendum)
 My medical advice is that you go to emergency room for further evaluation of your dizziness

## 2023-10-15 NOTE — Discharge Instructions (Addendum)
 Your labs are reassuring today.  You do not have any electrolyte abnormalities.  Your kidney and liver function are normal.  You had a slightly low hemoglobin at 11.6 which is when your blood counts.  Please have this repeated by your PCP within the next month for monitoring.  The CT of your head did not show any abnormality to explain your symptoms.  However, neurology recommended obtaining an additional scan to evaluate the vasculature in your neck.  You have opted not to obtain this additional scan.  Please continue monitoring your symptoms at home.  If there is any worsening dizziness, vision changes, unilateral weakness, slurred speech, please return immediately to emergency room.  If you found the medication today helpful, you may take 25 mg of meclizine  every 8 hours as needed for dizziness.  This is an over-the-counter medication you can obtain at any drugstore.

## 2023-10-15 NOTE — ED Triage Notes (Signed)
 Pt c/o dizziness and nausea upon waking at 0530 yesterday. Pt's LKW 10/13/2023 at 2300. Pt c/o ringing in left ear yesterday, but has resolved.

## 2023-10-15 NOTE — ED Triage Notes (Signed)
 Pt reports dizzy since she woke up at 0530. Dizziness increased at 1230 and became unsteady ,nauseated and ringing in left ear. Remains dizzy and mild impaired balance Seen and UC advised to come to ED. Denies pain, no visual changes difficulty speaking or weakness

## 2023-10-17 LAB — URINE CULTURE: Culture: NO GROWTH

## 2023-10-18 ENCOUNTER — Ambulatory Visit (HOSPITAL_COMMUNITY): Payer: Self-pay

## 2023-10-20 ENCOUNTER — Encounter: Payer: Self-pay | Admitting: Family

## 2023-10-20 ENCOUNTER — Ambulatory Visit: Admitting: Family

## 2023-10-20 VITALS — BP 120/76 | HR 64 | Temp 98.6°F | Resp 17 | Ht 61.0 in | Wt 160.0 lb

## 2023-10-20 DIAGNOSIS — H8113 Benign paroxysmal vertigo, bilateral: Secondary | ICD-10-CM | POA: Diagnosis not present

## 2023-10-20 NOTE — Patient Instructions (Signed)
 VISIT SUMMARY:  During your visit, we discussed your dizziness and its impact on your daily activities. We also reviewed your mild anemia and provided recommendations for managing your symptoms and overall health.  YOUR PLAN:  VERTIGO: You are experiencing dizziness described as an 'internal spinning' and 'foggy' sensation, sometimes accompanied by nausea. -Prescribe meclizine  12.5 mg as needed. -Refer to vestibular rehabilitation therapy. -Provide home exercises for vertigo management. -Continue using Bonine if it is helping. -Consider using Claritin and Flonase  for potential ear fluid.  GENERAL HEALTH MAINTENANCE: We discussed general health recommendations to help manage your symptoms. -Use Claritin and Flonase  for allergy management.

## 2023-10-20 NOTE — Assessment & Plan Note (Signed)
 New. Vestibular rehabilitation therapy recommended. - Refer to vestibular rehabilitation therapy. - Provided instructions on home Epley Maneuver to do in the meantime while she waits to get into PT.  - Advise continuation of OTC Bonine if tolerated. - Discussed addition of Claritin and Flonase  for possible eustachian tube dysfunction/middle ear fluid.

## 2023-10-20 NOTE — Progress Notes (Signed)
 Subjective:     Patient ID: Felicia Robbins, female    DOB: 1955-04-28, 68 y.o.   MRN: [id number]  Chief Complaint  Patient presents with   hospital follow up    Vertigo that has been going on since the 26th     HPI  Discussed the use of AI scribe software for clinical note transcription with the patient, who gave verbal consent to proceed.  History of Present Illness Felicia Robbins is a 68 year old female with cardiac conditions who presents with dizziness.  Dizziness began on October 15, 2023, leading to an urgent care visit and emergency department referral. It is described as an 'internal spinning' and 'foggy' sensation, worsened by movement, particularly when walking or performing activities, but also present when sitting still. Occasionally, it is accompanied by nausea. 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  Current medications include over-the-counter Bonine (meclizine ) at 25 mg as needed and Benadryl for symptom relief. Symptoms vary daily, with some days better than others, and a severe episode on the initial day, described as feeling 'drunk' upon waking.  No significant changes in vision, though occasional blurriness occurs. No pain is associated with the dizziness. Dizziness occurs when sitting still and worsens with movement, sometimes accompanied by nausea.  LABS Glucose: 108 (10/15/2023) Hb: 11.6 (10/15/2023) Urine culture: Negative (10/15/2023)  DIAGNOSTIC  EKG: Sinus tachycardia (10/15/2023)  RADIOLOGY:  CTA head w and without contrast- artifact from cochlear implant. 1. Mild intracranial atherosclerosis without a large vessel occlusion or significant proximal stenosis. 2. No evidence of an acute intracranial abnormality on the noncontrast head CT within limitation of streak artifact.     Health Maintenance Due  Topic Date Due   Hepatitis C Screening  Never done   Zoster Vaccines-  Shingrix (1 of 2) 10/20/1975   Pneumococcal Vaccine: 50+ Years (1 of 1 - PCV) Never done   DTaP/Tdap/Td (3 - Td or Tdap) 06/07/2022   MAMMOGRAM  04/03/2023    Past Medical History:  Diagnosis Date   Depression    GERD (gastroesophageal reflux disease)    Headache(784.0)    HOH (hard of hearing)    left-has inplant   Left bundle branch block    WPW (Wolff-Parkinson-White syndrome)    See scanned history note    Past Surgical History:  Procedure Laterality Date   BIOPSY BOWEL     small bowel biopsy- normal (colon plyps / colonoscopy 03/1999)   BREAST BIOPSY Left 07/13/2013   Procedure: BREAST BIOPSY WITH NEEDLE LOCALIZATION;  Surgeon: Debby A. Cornett, MD;  Location: [facility];  Service: General;  Laterality: Left;   BREAST SURGERY     Lt breast nodule - neg.   CHOLECYSTECTOMY  04/21/2003   lap choli   COLONOSCOPY  08/29/2018   COLONOSCOPY  02/24/2022   INNER EAR SURGERY  04/20/2009   Multiple surgeries Lt ear. Hearing device implanted Lt ear 2011.   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   TUBAL LIGATION      Family History  Problem Relation Age of Onset   Cancer Mother        breast   Diabetes Mother    Stroke Mother    Multiple sclerosis Mother    Crohn's disease Brother    Cancer Sister 97       breast   Diabetes Brother    Colon cancer Neg Hx  Liver cancer Neg Hx    Rectal cancer Neg Hx    Stomach cancer Neg Hx     Social History   Socioeconomic History   Marital status: Divorced    Spouse name: Not on file   Number of children: Not on file   Years of education: Not on file   Highest education level: Master's degree (e.g., MA, MS, MEng, MEd, MSW, MBA)  Occupational History   Occupation: IT  Tobacco Use   Smoking status: Former    Current packs/day: 0.00    Average packs/day: 1 pack/day for 15.0 years (15.0 ttl pk-yrs)    Types: Cigarettes    Start date: 04/21/1971    Quit date: 04/20/1986    Years since quitting: 37.5    Smokeless tobacco: Never  Vaping Use   Vaping status: Never Used  Substance and Sexual Activity   Alcohol use: Yes    Alcohol/week: 10.0 standard drinks of alcohol    Types: 10 Cans of beer per week   Drug use: No   Sexual activity: Not on file  Other Topics Concern   Not on file  Social History Narrative   Not on file   Social Drivers of Health   Financial Resource Strain: Low Risk  (07/09/2023)   Overall Financial Resource Strain (CARDIA)    Difficulty of Paying Living Expenses: Not hard at all  Food Insecurity: No Food Insecurity (07/09/2023)   Hunger Vital Sign    Worried About Running Out of Food in the Last Year: Never true    Ran Out of Food in the Last Year: Never true  Transportation Needs: No Transportation Needs (07/09/2023)   PRAPARE - Administrator, Civil Service (Medical): No    Lack of Transportation (Non-Medical): No  Physical Activity: Insufficiently Active (07/09/2023)   Exercise Vital Sign    Days of Exercise per Week: 2 days    Minutes of Exercise per Session: 20 min  Stress: No Stress Concern Present (07/09/2023)   [employer] of Occupational Health - Occupational Stress Questionnaire    Feeling of Stress : Only a little  Social Connections: Socially Isolated (07/09/2023)   Social Connection and Isolation Panel    Frequency of Communication with Friends and Family: More than three times a week    Frequency of Social Gatherings with Friends and Family: More than three times a week    Attends Religious Services: Never    Database administrator or Organizations: No    Attends Engineer, structural: Not on file    Marital Status: Divorced  Catering manager Violence: Not on file    Outpatient Medications Prior to Visit  Medication Sig Dispense Refill   albuterol  (PROAIR  HFA) 108 (90 Base) MCG/ACT inhaler Inhale 1-2 puffs into the lungs every 6 (six) hours as needed for wheezing or shortness of breath. 8 g 1   lisinopril  (ZESTRIL )  10 MG tablet Take 1 tablet (10 mg total) by mouth daily. 90 tablet 3   metoprolol  succinate (TOPROL -XL) 50 MG 24 hr tablet Take 1 tablet (50 mg total) by mouth daily. TAKE WITH OR IMMEDIATELY FOLLOWING A MEAL. 90 tablet 3   tiZANidine  (ZANAFLEX ) 4 MG tablet Take 1 tablet (4 mg total) by mouth every 6 (six) hours as needed (headache). 30 tablet 2   No facility-administered medications prior to visit.    Allergies  Allergen Reactions   Tetracycline Other (See Comments)    REACTION: rash, passed out REACTION:  passed  out    ROS    See HPI Objective:    Physical Exam Constitutional:      General: She is not in acute distress.    Appearance: Normal appearance. She is well-developed.  HENT:     Head: Normocephalic and atraumatic.     Right Ear: External ear normal. There is impacted cerumen.     Left Ear: External ear normal. Tympanic membrane is scarred (? mild serous effusion, no erythema, difficult to tell due to scarring).  Eyes:     General: No scleral icterus. Neck:     Thyroid : No thyromegaly.  Cardiovascular:     Rate and Rhythm: Normal rate and regular rhythm.     Heart sounds: Normal heart sounds. No murmur heard. Pulmonary:     Effort: Pulmonary effort is normal. No respiratory distress.     Breath sounds: Normal breath sounds. No wheezing.  Musculoskeletal:     Cervical back: Neck supple.  Skin:    General: Skin is warm and dry.  Neurological:     Mental Status: She is alert and oriented to person, place, and time.     Cranial Nerves: Cranial nerves 2-12 are intact.     Comments: + Trenda Shona Grebe maneuver bilaterally L>R  Psychiatric:        Mood and Affect: Mood normal.        Behavior: Behavior normal.        Thought Content: Thought content normal.        Judgment: Judgment normal.      BP 120/76 (BP Location: Right Arm, Patient Position: Sitting, Cuff Size: Normal)   Pulse 64   Temp 98.6 F (37 C) (Oral)   Resp 17   Ht 5' 1 (1.549 m)   Wt 160 lb  (72.6 kg)   LMP 11/07/2010   SpO2 98%   BMI 30.23 kg/m  Wt Readings from Last 3 Encounters:  10/20/23 160 lb (72.6 kg)  10/15/23 160 lb (72.6 kg)  07/13/23 161 lb 3.2 oz (73.1 kg)       Assessment & Plan:   Problem List Items Addressed This Visit       Unprioritized   Benign paroxysmal positional vertigo due to bilateral vestibular disorder - Primary   New. Vestibular rehabilitation therapy recommended. - Refer to vestibular rehabilitation therapy. - Provided instructions on home Epley Maneuver to do in the meantime while she waits to get into PT.  - Advise continuation of OTC Bonine if tolerated. - Discussed addition of Claritin and Flonase  for possible eustachian tube dysfunction/middle ear fluid.        Relevant Orders   Ambulatory referral to Physical Therapy   30 minutes spent on today's visit. Time was spent reviewing medical record, examining patient/counseling pt on BPPV and development of medical plan.  I am having Felicia Robbins. [facility] maintain her albuterol , lisinopril , metoprolol  succinate, and tiZANidine .  No orders of the defined types were placed in this encounter.

## 2023-10-21 ENCOUNTER — Ambulatory Visit: Admitting: Family

## 2023-10-27 ENCOUNTER — Encounter: Payer: Self-pay | Admitting: Family

## 2023-10-29 ENCOUNTER — Ambulatory Visit: Admitting: Rehabilitation

## 2023-12-24 ENCOUNTER — Other Ambulatory Visit (HOSPITAL_BASED_OUTPATIENT_CLINIC_OR_DEPARTMENT_OTHER): Payer: Self-pay

## 2023-12-24 ENCOUNTER — Ambulatory Visit: Admitting: Family

## 2023-12-24 VITALS — BP 114/60 | HR 78 | Temp 99.3°F | Ht 61.0 in | Wt 162.2 lb

## 2023-12-24 DIAGNOSIS — J209 Acute bronchitis, unspecified: Secondary | ICD-10-CM

## 2023-12-24 MED ORDER — HYDROCODONE BIT-HOMATROP MBR 5-1.5 MG/5ML PO SOLN
5.0000 mL | Freq: Three times a day (TID) | ORAL | 0 refills | Status: DC | PRN
  Filled 2023-12-24: qty 120, 8d supply, fill #0

## 2023-12-24 MED ORDER — AMOXICILLIN-POT CLAVULANATE 875-125 MG PO TABS
1.0000 | ORAL_TABLET | Freq: Two times a day (BID) | ORAL | 0 refills | Status: AC
Start: 2023-12-24 — End: 2023-12-31
  Filled 2023-12-24: qty 14, 7d supply, fill #0

## 2023-12-24 MED ORDER — PREDNISONE 20 MG PO TABS
20.0000 mg | ORAL_TABLET | Freq: Every day | ORAL | 0 refills | Status: DC
  Filled 2023-12-24: qty 5, 5d supply, fill #0

## 2023-12-24 NOTE — Progress Notes (Signed)
 Felicia Robbins is a 68 y.o. female with the following history as recorded in [employer]:  Patient Active Problem List   Diagnosis Date Noted   Benign paroxysmal positional vertigo due to bilateral vestibular disorder 10/20/2023   Left elbow pain 11/11/2021   Allergic conjunctivitis 09/16/2018   Left groin pain 08/17/2018   IBS (irritable bowel syndrome) 08/17/2018   Left sided abdominal pain 06/27/2018   Constipation 06/27/2018   Colon cancer screening 03/21/2018   Lung nodules 03/21/2018   Osteopenia 03/21/2018   LBBB (left bundle branch block) 01/08/2018   Chest pain 12/17/2017   Palpitations 12/17/2017   Hyperlipidemia 03/17/2017   Elevated glucose level 03/17/2017   Obesity 02/11/2016   Lactose intolerance 02/11/2016   Stress reaction, emotional 01/19/2012   Gynecologic exam normal 12/08/2010   Routine general medical examination at a health care facility 11/29/2010   Hot flashes 08/11/2010   Allergic rhinitis 08/15/2007   FIBROCYSTIC BREAST DISEASE 09/24/2006   History of colonic polyps 09/24/2006   Migraine without aura 08/25/2006   DECREASED HEARING 08/25/2006   Essential hypertension 08/25/2006   Esophageal reflux 08/25/2006   History of migraine headaches 08/25/2006    Current Outpatient Medications  Medication Sig Dispense Refill   albuterol  (PROAIR  HFA) 108 (90 Base) MCG/ACT inhaler Inhale 1-2 puffs into the lungs every 6 (six) hours as needed for wheezing or shortness of breath. 8 g 1   amoxicillin -clavulanate (AUGMENTIN ) 875-125 MG tablet Take 1 tablet by mouth 2 (two) times daily for 7 days. 14 tablet 0   HYDROcodone  bit-homatropine (HYCODAN) 5-1.5 MG/5ML syrup Take 5 mLs by mouth every 8 (eight) hours as needed for cough. 120 mL 0   lisinopril  (ZESTRIL ) 10 MG tablet Take 1 tablet (10 mg total) by mouth daily. 90 tablet 3   metoprolol  succinate (TOPROL -XL) 50 MG 24 hr tablet Take 1 tablet (50 mg total) by mouth daily. TAKE WITH OR IMMEDIATELY FOLLOWING A MEAL.  90 tablet 3   naproxen  (NAPROSYN ) 500 MG tablet Take 500 mg by mouth 2 (two) times daily.     predniSONE  (DELTASONE ) 20 MG tablet Take 1 tablet (20 mg total) by mouth daily with breakfast. 5 tablet 0   tiZANidine  (ZANAFLEX ) 4 MG tablet Take 1 tablet (4 mg total) by mouth every 6 (six) hours as needed (headache). 30 tablet 2   No current facility-administered medications for this visit.    Allergies: Tetracycline  Past Medical History:  Diagnosis Date   Depression    GERD (gastroesophageal reflux disease)    Headache(784.0)    HOH (hard of hearing)    left-has inplant   Left bundle branch block    WPW (Wolff-Parkinson-White syndrome)    See scanned history note    Past Surgical History:  Procedure Laterality Date   BIOPSY BOWEL     small bowel biopsy- normal (colon plyps / colonoscopy 03/1999)   BREAST BIOPSY Left 07/13/2013   Procedure: BREAST BIOPSY WITH NEEDLE LOCALIZATION;  Surgeon: Debby A. Cornett, MD;  Location: [facility];  Service: General;  Laterality: Left;   BREAST SURGERY     Lt breast nodule - neg.   CHOLECYSTECTOMY  04/21/2003   lap choli   COLONOSCOPY  08/29/2018   COLONOSCOPY  02/24/2022   INNER EAR SURGERY  04/20/2009   Multiple surgeries Lt ear. Hearing device implanted Lt ear 2011.   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   TUBAL LIGATION      Family History  Problem  Relation Age of Onset   Cancer Mother        breast   Diabetes Mother    Stroke Mother    Multiple sclerosis Mother    Crohn's disease Brother    Cancer Sister 71       breast   Diabetes Brother    Colon cancer Neg Hx    Liver cancer Neg Hx    Rectal cancer Neg Hx    Stomach cancer Neg Hx     Social History   Tobacco Use   Smoking status: Former    Current packs/day: 0.00    Average packs/day: 1 pack/day for 15.0 years (15.0 ttl pk-yrs)    Types: Cigarettes    Start date: 04/21/1971    Quit date: 04/20/1986    Years since quitting: 37.7   Smokeless  tobacco: Never  Substance Use Topics   Alcohol use: Yes    Alcohol/week: 10.0 standard drinks of alcohol    Types: 10 Cans of beer per week    Subjective:   5-6 day history of cough/ congestion; started with cough and sore throat; notes that is feeling better today but cough is persisting; + drainage; no fever;   Objective:  Vitals:   12/24/23 1510  BP: 114/60  Pulse: 78  Temp: 99.3 F (37.4 C)  TempSrc: Oral  SpO2: 95%  Weight: 162 lb 3.2 oz (73.6 kg)  Height: 5' 1 (1.549 m)    General: Well developed, well nourished, in no acute distress  Skin : Warm and dry.  Head: Normocephalic and atraumatic  Eyes: Sclera and conjunctiva clear; pupils round and reactive to light; extraocular movements intact  Ears: External normal; canals clear; tympanic membranes normal  Oropharynx: Pink, supple. No suspicious lesions  Neck: Supple without thyromegaly, adenopathy  Lungs: Respirations unlabored; wheeze noted in right lower lobe; CVS exam: normal rate and regular rhythm.  Neurologic: Alert and oriented; speech intact; face symmetrical; moves all extremities well; CNII-XII intact without focal deficit   Assessment:  1. Acute bronchitis, unspecified organism     Plan:  Rx for Augmentin  875 mg bid x 7 days, Prednisone  20 mg every day x 5 days, Hycodan cough syrup; increase fluids, rest and follow up worse, no better.   No follow-ups on file.  No orders of the defined types were placed in this encounter.   Requested Prescriptions   Signed Prescriptions Disp Refills   amoxicillin -clavulanate (AUGMENTIN ) 875-125 MG tablet 14 tablet 0    Sig: Take 1 tablet by mouth 2 (two) times daily for 7 days.   predniSONE  (DELTASONE ) 20 MG tablet 5 tablet 0    Sig: Take 1 tablet (20 mg total) by mouth daily with breakfast.   HYDROcodone  bit-homatropine (HYCODAN) 5-1.5 MG/5ML syrup 120 mL 0    Sig: Take 5 mLs by mouth every 8 (eight) hours as needed for cough.

## 2024-01-04 ENCOUNTER — Other Ambulatory Visit (HOSPITAL_BASED_OUTPATIENT_CLINIC_OR_DEPARTMENT_OTHER): Payer: Self-pay

## 2024-01-05 ENCOUNTER — Other Ambulatory Visit (HOSPITAL_BASED_OUTPATIENT_CLINIC_OR_DEPARTMENT_OTHER): Payer: Self-pay

## 2024-01-05 MED ORDER — FLUZONE HIGH-DOSE 0.5 ML IM SUSY
0.5000 mL | PREFILLED_SYRINGE | Freq: Once | INTRAMUSCULAR | 0 refills | Status: AC
Start: 2024-01-05 — End: 2024-01-06
  Filled 2024-01-05: qty 0.5, 1d supply, fill #0

## 2024-01-05 MED ORDER — PREVNAR 20 0.5 ML IM SUSY
PREFILLED_SYRINGE | INTRAMUSCULAR | 0 refills | Status: DC
  Filled 2024-01-05: qty 0.5, 1d supply, fill #0

## 2024-02-07 ENCOUNTER — Other Ambulatory Visit (HOSPITAL_BASED_OUTPATIENT_CLINIC_OR_DEPARTMENT_OTHER): Payer: Self-pay

## 2024-02-21 ENCOUNTER — Encounter: Payer: Self-pay | Admitting: Radiology

## 2024-02-25 ENCOUNTER — Ambulatory Visit
Admission: RE | Admit: 2024-02-25 | Discharge: 2024-02-25 | Disposition: A | Discharge status: Home / Self Care | Source: Ambulatory Visit | Attending: Student | Admitting: Student

## 2024-02-25 ENCOUNTER — Other Ambulatory Visit (HOSPITAL_BASED_OUTPATIENT_CLINIC_OR_DEPARTMENT_OTHER): Payer: Self-pay

## 2024-02-25 VITALS — BP 96/65 | HR 76 | Temp 98.6°F | Ht 61.0 in | Wt 155.0 lb

## 2024-02-25 DIAGNOSIS — J069 Acute upper respiratory infection, unspecified: Secondary | ICD-10-CM | POA: Diagnosis not present

## 2024-02-25 DIAGNOSIS — J029 Acute pharyngitis, unspecified: Secondary | ICD-10-CM

## 2024-02-25 LAB — POC SOFIA SARS ANTIGEN FIA: SARS Coronavirus 2 Ag: NEGATIVE

## 2024-02-25 LAB — POCT RAPID STREP A (OFFICE): Rapid Strep A Screen: NEGATIVE

## 2024-02-25 MED ORDER — AZELASTINE HCL 0.1 % NA SOLN
2.0000 | Freq: Two times a day (BID) | NASAL | 2 refills | Status: AC
  Filled 2024-02-25: qty 30, 50d supply, fill #0

## 2024-02-25 NOTE — Discharge Instructions (Signed)
 You been diagnosed with a viral illness today. -Your strep and covid tests were negative. - Azelastine nasal spray, 2 sprays twice daily while symptoms persist. -Viruses have to run their course and medicines that are prescribed are meant to help with symptoms. Antibiotics are not effective for viruses. - With viruses usually feel poorly from 3 to 7 days with cough being the last symptoms to resolve.  -Cough can linger from days to weeks.   -If your cough lasts more than 2 weeks and you are coughing so hard that you are vomiting or feel like you could pass out we need to follow-up with PCP for further testing and evaluation. -Rest, increase water intake -May use pseudoephedrine (Sudafed) for nasal congestion. You must ask the pharmacist for this medication, though it does not require a prescription, -May use Delsym (dextromethorphan) or honey as needed for cough,  -For pain (including throat pain!) and fever: -Take tylenol (up to 3000mg /day) and ibuprofen  (up to 3200mg /day). Take ibuprofen  with food. If you are taking ibuprofen , do not take additional NSAIDs, like alleve, naproxen , meloxicam , etc. -If you have hypertension you should take Coricidin or other OTC meds approved for people with high blood pressure. Avoid Sudafed. -You may use a spoonful of honey every 4-6 hours as needed for throat pain and cough. -Warm tea with honey and lemon are helpful for soothe throat as well.  Chloraseptic and Cepacol make a throat lozenge with numbing medication, can be purchased over-the-counter. -May also use Flonase  or Nasacort, or sinus rinse for sinus pressure or nasal congestion.  Be sure to use distilled bottled water for sinus rinses. -May use coolmist humidifier to open up nasal passages -May elevate head to assist with postnasal drainage. -If you feel poorly (fever, fatigue, shortness of breath, nausea, etc.) for more than 10 days; or if your symptoms improve and then worsen again around day 10; be  sure to follow-up with PCP or in clinic for further evaluation and additional treatments. -If you experience chest pain with shortness of breath or pulse oxygen less than 95% you should report to the ER.

## 2024-02-25 NOTE — ED Triage Notes (Signed)
Pt c/o cough and sore throat for 2 days

## 2024-02-25 NOTE — ED Provider Notes (Signed)
[facility]    CSN: [id number] Arrival date & time: 02/25/24  1113      History   Chief Complaint Chief Complaint  Patient presents with   Cough    Throat is very sore. - Entered by patient    HPI Felicia Robbins is a 68 y.o. female presenting with cough and sore throat x2 days. H/o WPW. Cough is productive of pale yellow sputum. Denies h/o pulm ds but is prescribed albuterol  inhaler which is not helping during present illness (has attempted 3x). She is prescribed naproxen  for HA and arthiritis and has taken this with some relief of HA during present illness. Delsym did not provide relief.  HPI  Past Medical History:  Diagnosis Date   Depression    GERD (gastroesophageal reflux disease)    Headache(784.0)    HOH (hard of hearing)    left-has inplant   Left bundle branch block    WPW (Wolff-Parkinson-White syndrome)    See scanned history note    Patient Active Problem List   Diagnosis Date Noted   Benign paroxysmal positional vertigo due to bilateral vestibular disorder 10/20/2023   Left elbow pain 11/11/2021   Allergic conjunctivitis 09/16/2018   Left groin pain 08/17/2018   IBS (irritable bowel syndrome) 08/17/2018   Left sided abdominal pain 06/27/2018   Constipation 06/27/2018   Colon cancer screening 03/21/2018   Lung nodules 03/21/2018   Osteopenia 03/21/2018   LBBB (left bundle branch block) 01/08/2018   Chest pain 12/17/2017   Palpitations 12/17/2017   Hyperlipidemia 03/17/2017   Elevated glucose level 03/17/2017   Obesity 02/11/2016   Lactose intolerance 02/11/2016   Stress reaction, emotional 01/19/2012   Gynecologic exam normal 12/08/2010   Routine general medical examination at a health care facility 11/29/2010   Hot flashes 08/11/2010   Allergic rhinitis 08/15/2007   FIBROCYSTIC BREAST DISEASE 09/24/2006   History of colonic polyps 09/24/2006   Migraine without aura 08/25/2006   DECREASED HEARING 08/25/2006   Essential  hypertension 08/25/2006   Esophageal reflux 08/25/2006   History of migraine headaches 08/25/2006    Past Surgical History:  Procedure Laterality Date   BIOPSY BOWEL     small bowel biopsy- normal (colon plyps / colonoscopy 03/1999)   BREAST BIOPSY Left 07/13/2013   Procedure: BREAST BIOPSY WITH NEEDLE LOCALIZATION;  Surgeon: Debby A. Cornett, MD;  Location: [facility];  Service: General;  Laterality: Left;   BREAST SURGERY     Lt breast nodule - neg.   CHOLECYSTECTOMY  04/21/2003   lap choli   COLONOSCOPY  08/29/2018   COLONOSCOPY  02/24/2022   INNER EAR SURGERY  04/20/2009   Multiple surgeries Lt ear. Hearing device implanted Lt ear 2011.   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      Home Medications    Prior to Admission medications   Medication Sig Start Date End Date Taking? Authorizing Provider  azelastine (ASTELIN) 0.1 % nasal spray Place 2 sprays into both nostrils 2 (two) times daily. Use in each nostril as directed 02/25/24  Yes Arlyss Leita BRAVO, PA-C  albuterol  (PROAIR  HFA) 108 (90 Base) MCG/ACT inhaler Inhale 1-2 puffs into the lungs every 6 (six) hours as needed for wheezing or shortness of breath. 12/05/21   Jason Leita Repine, FNP  HYDROcodone  bit-homatropine [facility]) 5-1.5 MG/5ML syrup Take 5 mLs by mouth every 8 (  eight) hours as needed for cough. 12/24/23   Jason Leita Repine, FNP  lisinopril  (ZESTRIL ) 10 MG tablet Take 1 tablet (10 mg total) by mouth daily. 07/13/23   Jason Leita Repine, FNP  metoprolol  succinate (TOPROL -XL) 50 MG 24 hr tablet Take 1 tablet (50 mg total) by mouth daily. TAKE WITH OR IMMEDIATELY FOLLOWING A MEAL. 07/13/23   Jason Leita Repine, FNP  naproxen  (NAPROSYN ) 500 MG tablet Take 500 mg by mouth 2 (two) times daily. 12/21/23   [provider]  pneumococcal 20-valent conjugate vaccine (PREVNAR 20 ) 0.5 ML injection Inject into the  muscle. 01/05/24   Luiz Channel, MD  predniSONE  (DELTASONE ) 20 MG tablet Take 1 tablet (20 mg total) by mouth daily with breakfast. Patient not taking: Reported on 02/25/2024 12/24/23   Jason Leita Repine, FNP  tiZANidine  (ZANAFLEX ) 4 MG tablet Take 1 tablet (4 mg total) by mouth every 6 (six) hours as needed (headache). 07/13/23   Jason Leita Repine, FNP    Family History Family History  Problem Relation Age of Onset   Cancer Mother        breast   Diabetes Mother    Stroke Mother    Multiple sclerosis Mother    Crohn's disease Brother    Cancer Sister 91       breast   Diabetes Brother    Colon cancer Neg Hx    Liver cancer Neg Hx    Rectal cancer Neg Hx    Stomach cancer Neg Hx     Social History Social History   Tobacco Use   Smoking status: Former    Current packs/day: 0.00    Average packs/day: 1 pack/day for 15.0 years (15.0 ttl pk-yrs)    Types: Cigarettes    Start date: 04/21/1971    Quit date: 04/20/1986    Years since quitting: 37.8   Smokeless tobacco: Never  Vaping Use   Vaping status: Never Used  Substance Use Topics   Alcohol use: Yes    Alcohol/week: 10.0 standard drinks of alcohol    Types: 10 Cans of beer per week   Drug use: No     Allergies   Tetracycline   Review of Systems Review of Systems  Constitutional:  Negative for appetite change, chills and fever.  HENT:  Positive for congestion and sore throat. Negative for ear pain, rhinorrhea, sinus pressure and sinus pain.   Eyes:  Negative for redness and visual disturbance.  Respiratory:  Positive for cough. Negative for chest tightness, shortness of breath and wheezing.   Cardiovascular:  Negative for chest pain and palpitations.  Gastrointestinal:  Negative for abdominal pain, constipation, diarrhea, nausea and vomiting.  Genitourinary:  Negative for dysuria, frequency and urgency.  Musculoskeletal:  Negative for myalgias.  Neurological:  Negative for dizziness, weakness and  headaches.  Psychiatric/Behavioral:  Negative for confusion.   All other systems reviewed and are negative.    Physical Exam Triage Vital Signs ED Triage Vitals  Encounter Vitals Group     BP 02/25/24 1122 96/65     Girls Systolic BP Percentile --      Girls Diastolic BP Percentile --      Boys Systolic BP Percentile --      Boys Diastolic BP Percentile --      Pulse Rate 02/25/24 1122 76     Resp --      Temp 02/25/24 1122 98.6 F (37 C)     Temp Source 02/25/24 1122 Oral  SpO2 02/25/24 1122 94 %     Weight 02/25/24 1122 155 lb (70.3 kg)     Height 02/25/24 1122 5' 1 (1.549 m)     Head Circumference --      Peak Flow --      Pain Score 02/25/24 1128 6     Pain Loc --      Pain Education --      Exclude from Growth Chart --    No data found.  Updated Vital Signs BP 96/65 (BP Location: Right Arm)   Pulse 76   Temp 98.6 F (37 C) (Oral)   Ht 5' 1 (1.549 m)   Wt 155 lb (70.3 kg)   LMP 11/07/2010   SpO2 94%   BMI 29.29 kg/m   Visual Acuity Right Eye Distance:   Left Eye Distance:   Bilateral Distance:    Right Eye Near:   Left Eye Near:    Bilateral Near:     Physical Exam Vitals reviewed.  Constitutional:      General: She is not in acute distress.    Appearance: Normal appearance. She is not ill-appearing.  HENT:     Head: Normocephalic and atraumatic.     Right Ear: Tympanic membrane, ear canal and external ear normal. No tenderness. No middle ear effusion. There is no impacted cerumen. Tympanic membrane is not perforated, erythematous, retracted or bulging.     Left Ear: Tympanic membrane, ear canal and external ear normal. No tenderness.  No middle ear effusion. There is no impacted cerumen. Tympanic membrane is not perforated, erythematous, retracted or bulging.     Nose: Nose normal. No congestion.     Mouth/Throat:     Mouth: Mucous membranes are moist.     Pharynx: Uvula midline. No oropharyngeal exudate or posterior oropharyngeal erythema.      Tonsils: No tonsillar exudate. 0 on the right. 0 on the left.     Comments: Tonsils are small and not erythematous, with no exudate. Eyes:     Extraocular Movements: Extraocular movements intact.     Pupils: Pupils are equal, round, and reactive to light.  Cardiovascular:     Rate and Rhythm: Normal rate and regular rhythm.     Heart sounds: Normal heart sounds.  Pulmonary:     Effort: Pulmonary effort is normal.     Breath sounds: Normal breath sounds. No decreased breath sounds, wheezing, rhonchi or rales.  Abdominal:     Palpations: Abdomen is soft.     Tenderness: There is no abdominal tenderness. There is no guarding or rebound.  Lymphadenopathy:     Cervical: No cervical adenopathy.     Right cervical: No superficial, deep or posterior cervical adenopathy.    Left cervical: No superficial, deep or posterior cervical adenopathy.  Neurological:     General: No focal deficit present.     Mental Status: She is alert and oriented to person, place, and time.  Psychiatric:        Mood and Affect: Mood normal.        Behavior: Behavior normal.        Thought Content: Thought content normal.        Judgment: Judgment normal.      UC Treatments / Results  Labs (all labs ordered are listed, but only abnormal results are displayed) Labs Reviewed  POC SOFIA SARS ANTIGEN FIA  POCT RAPID STREP A (OFFICE)    EKG   Radiology No results found.  Procedures Procedures (  including critical care time)  Medications Ordered in UC Medications - No data to display  Initial Impression / Assessment and Plan / UC Course  I have reviewed the triage vital signs and the nursing notes.  Pertinent labs & imaging results that were available during my care of the patient were reviewed by me and considered in my medical decision making (see chart for details).     Patient is a pleasant 68 year old female presenting on day 2 of viral syndrome.  She is afebrile and nontachycardic.   Antipyretic has not been administered today.  She does not have a history of pulmonary disease, and there are no adventitious breath sounds on exam.  Rapid COVID-negative.  Rapid strep negative, culture sent.  Will manage symptomatically with azelastine nasal spray, and she can continue over-the-counter medications like TheraFlu.  Return precautions as below.  Final Clinical Impressions(s) / UC Diagnoses   Final diagnoses:  Sore throat  Viral URI with cough     Discharge Instructions      You been diagnosed with a viral illness today. -Your strep and covid tests were negative. - Azelastine nasal spray, 2 sprays twice daily while symptoms persist. -Viruses have to run their course and medicines that are prescribed are meant to help with symptoms. Antibiotics are not effective for viruses. - With viruses usually feel poorly from 3 to 7 days with cough being the last symptoms to resolve.  -Cough can linger from days to weeks.   -If your cough lasts more than 2 weeks and you are coughing so hard that you are vomiting or feel like you could pass out we need to follow-up with PCP for further testing and evaluation. -Rest, increase water intake -May use pseudoephedrine (Sudafed) for nasal congestion. You must ask the pharmacist for this medication, though it does not require a prescription, -May use Delsym (dextromethorphan) or honey as needed for cough,  -For pain (including throat pain!) and fever: -Take tylenol (up to 3000mg /day) and ibuprofen  (up to 3200mg /day). Take ibuprofen  with food. If you are taking ibuprofen , do not take additional NSAIDs, like alleve, naproxen , meloxicam , etc. -If you have hypertension you should take Coricidin or other OTC meds approved for people with high blood pressure. Avoid Sudafed. -You may use a spoonful of honey every 4-6 hours as needed for throat pain and cough. -Warm tea with honey and lemon are helpful for soothe throat as well.  Chloraseptic and Cepacol  make a throat lozenge with numbing medication, can be purchased over-the-counter. -May also use Flonase  or Nasacort, or sinus rinse for sinus pressure or nasal congestion.  Be sure to use distilled bottled water for sinus rinses. -May use coolmist humidifier to open up nasal passages -May elevate head to assist with postnasal drainage. -If you feel poorly (fever, fatigue, shortness of breath, nausea, etc.) for more than 10 days; or if your symptoms improve and then worsen again around day 10; be sure to follow-up with PCP or in clinic for further evaluation and additional treatments. -If you experience chest pain with shortness of breath or pulse oxygen less than 95% you should report to the ER.      ED Prescriptions     Medication Sig Dispense Auth. Provider   azelastine (ASTELIN) 0.1 % nasal spray Place 2 sprays into both nostrils 2 (two) times daily. Use in each nostril as directed 30 mL Arlyss Leita BRAVO, PA-C      PDMP not reviewed this encounter.   Haroun Cotham E, PA-C  02/25/24 1217  

## 2024-05-11 ENCOUNTER — Ambulatory Visit: Admitting: Family Medicine

## 2024-05-11 ENCOUNTER — Other Ambulatory Visit (HOSPITAL_BASED_OUTPATIENT_CLINIC_OR_DEPARTMENT_OTHER): Payer: Self-pay

## 2024-05-11 ENCOUNTER — Encounter: Payer: Self-pay | Admitting: Family Medicine

## 2024-05-11 VITALS — BP 110/60 | HR 77 | Temp 98.9°F | Resp 16 | Ht 61.0 in | Wt 159.2 lb

## 2024-05-11 DIAGNOSIS — E782 Mixed hyperlipidemia: Secondary | ICD-10-CM | POA: Diagnosis not present

## 2024-05-11 DIAGNOSIS — M1711 Unilateral primary osteoarthritis, right knee: Secondary | ICD-10-CM | POA: Diagnosis not present

## 2024-05-11 DIAGNOSIS — I10 Essential (primary) hypertension: Secondary | ICD-10-CM | POA: Diagnosis not present

## 2024-05-11 DIAGNOSIS — G43009 Migraine without aura, not intractable, without status migrainosus: Secondary | ICD-10-CM | POA: Diagnosis not present

## 2024-05-11 MED ORDER — METOPROLOL SUCCINATE ER 50 MG PO TB24
50.0000 mg | ORAL_TABLET | Freq: Every day | ORAL | 3 refills | Status: AC
  Filled 2024-05-11: qty 90, 90d supply, fill #0

## 2024-05-11 MED ORDER — NAPROXEN 500 MG PO TABS
500.0000 mg | ORAL_TABLET | Freq: Two times a day (BID) | ORAL | 3 refills | Status: AC
  Filled 2024-05-11: qty 60, 30d supply, fill #0

## 2024-05-11 MED ORDER — TIZANIDINE HCL 4 MG PO TABS
4.0000 mg | ORAL_TABLET | Freq: Four times a day (QID) | ORAL | 2 refills | Status: AC | PRN
  Filled 2024-05-11: qty 30, 8d supply, fill #0

## 2024-05-11 MED ORDER — LISINOPRIL 10 MG PO TABS
10.0000 mg | ORAL_TABLET | Freq: Every day | ORAL | 3 refills | Status: AC
  Filled 2024-05-11: qty 90, 90d supply, fill #0

## 2024-05-11 NOTE — Progress Notes (Signed)
 "  Subjective:    Patient ID: Felicia Robbins, female    DOB: 22-Dec-1955, 69 y.o.   MRN: [id number]  Chief Complaint  Patient presents with   New Patient (Initial Visit)    HPI Patient is in today to establish with me.  Discussed the use of AI scribe software for clinical note transcription with the patient, who gave verbal consent to proceed.  History of Present Illness Felicia Robbins is a 69 year old female who presents for a routine follow-up visit.  She is currently taking lisinopril  and metoprolol  for hypertension, with recent refills obtained before a vacation. She does not require a refill for another three to four days. Additionally, she takes tizanidine  for migraines, which she finds effective in alleviating her headaches. She began using tizanidine  during perimenopause.  She has arthritis in her right knee and reports that her doctor told her it was 'bone on bone' based on x-ray. She has received an injection for this condition. Naproxen  has been beneficial for her knee pain and sleep, but she has been out of it for a while and forgot to request a refill.  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  She has a history of Wolf-Parkinson-White syndrome, which she was told about after an EKG. She experiences episodes occasionally but has not consulted a cardiologist recently.  In July, she experienced vertigo, leading to a CT scan of the head in June. She was advised to visit the emergency room for further evaluation at that time.  She has a history of allergies, which have been manageable this year. She uses an inhaler as needed for her allergies.  She recently retired on August 1st and is adjusting to Medicare. She lives close to her medical providers and pharmacy, which she finds convenient.    Past  Medical History:  Diagnosis Date   Depression    GERD (gastroesophageal reflux disease)    Headache(784.0)    HOH (hard of hearing)    left-has inplant   Left bundle branch block    WPW (Wolff-Parkinson-White syndrome)    See scanned history note    Past Surgical History:  Procedure Laterality Date   BIOPSY BOWEL     small bowel biopsy- normal (colon plyps / colonoscopy 03/1999)   BREAST BIOPSY Left 07/13/2013   Procedure: BREAST BIOPSY WITH NEEDLE LOCALIZATION;  Surgeon: Debby A. Cornett, MD;  Location: [facility];  Service: General;  Laterality: Left;   BREAST SURGERY     Lt breast nodule - neg.   CHOLECYSTECTOMY  04/21/2003   lap choli   COLONOSCOPY  08/29/2018   COLONOSCOPY  02/24/2022   INNER EAR SURGERY  04/20/2009   Multiple surgeries Lt ear. Hearing device implanted Lt ear 2011.   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   TUBAL LIGATION      Family History  Problem Relation Age of Onset   Cancer Mother        breast   Diabetes Mother    Stroke Mother    Multiple sclerosis Mother    Crohn's disease Brother    Cancer Sister 70       breast   Diabetes Brother    Colon cancer Neg Hx    Liver cancer Neg Hx    Rectal cancer Neg  Hx    Stomach cancer Neg Hx     Social History   Socioeconomic History   Marital status: Divorced    Spouse name: Not on file   Number of children: Not on file   Years of education: Not on file   Highest education level: Master's degree (e.g., MA, MS, MEng, MEd, MSW, MBA)  Occupational History   Occupation: IT  Tobacco Use   Smoking status: Former    Current packs/day: 0.00    Average packs/day: 1 pack/day for 15.0 years (15.0 ttl pk-yrs)    Types: Cigarettes    Start date: 04/21/1971    Quit date: 04/20/1986    Years since quitting: 38.0   Smokeless tobacco: Never  Vaping Use   Vaping status: Never Used  Substance and Sexual Activity   Alcohol use: Yes    Alcohol/week: 10.0 standard drinks of alcohol     Types: 10 Cans of beer per week   Drug use: No   Sexual activity: Not on file  Other Topics Concern   Not on file  Social History Narrative   Not on file   Social Drivers of Health   Tobacco Use: Medium Risk (05/11/2024)   Patient History    Smoking Tobacco Use: Former    Smokeless Tobacco Use: Never    Passive Exposure: Not on file  Financial Resource Strain: Low Risk (05/07/2024)   Overall Financial Resource Strain (CARDIA)    Difficulty of Paying Living Expenses: Not hard at all  Food Insecurity: No Food Insecurity (05/07/2024)   Epic    Worried About Programme Researcher, Broadcasting/film/video in the Last Year: Never true    Ran Out of Food in the Last Year: Never true  Transportation Needs: No Transportation Needs (05/07/2024)   Epic    Lack of Transportation (Medical): No    Lack of Transportation (Non-Medical): No  Physical Activity: Insufficiently Active (05/07/2024)   Exercise Vital Sign    Days of Exercise per Week: 2 days    Minutes of Exercise per Session: 30 min  Stress: No Stress Concern Present (05/07/2024)   [employer] of Occupational Health - Occupational Stress Questionnaire    Feeling of Stress: Not at all  Social Connections: Socially Isolated (05/07/2024)   Social Connection and Isolation Panel    Frequency of Communication with Friends and Family: More than three times a week    Frequency of Social Gatherings with Friends and Family: More than three times a week    Attends Religious Services: Never    Database Administrator or Organizations: No    Attends Engineer, Structural: Not on file    Marital Status: Divorced  Intimate Partner Violence: Not on file  Depression (PHQ2-9): Low Risk (12/24/2023)   Depression (PHQ2-9)    PHQ-2 Score: 0  Alcohol Screen: Low Risk (05/07/2024)   Alcohol Screen    Last Alcohol Screening Score (AUDIT): 2  Housing: Low Risk (05/07/2024)   Epic    Unable to Pay for Housing in the Last Year: No    Number of Times Moved in the Last  Year: 0    Homeless in the Last Year: No  Utilities: Low Risk (04/06/2024)   Received from [facility]   Utilities    In the past 12 months has the electric, gas, oil, or water company threatened to shut off services in your home? : No  Health Literacy: Not on file    Outpatient Medications Prior to Visit  Medication Sig Dispense Refill   albuterol  (PROAIR  HFA) 108 (90 Base) MCG/ACT inhaler Inhale 1-2 puffs into the lungs every 6 (six) hours as needed for wheezing or shortness of breath. 8 g 1   azelastine  (ASTELIN ) 0.1 % nasal spray Place 2 sprays into both nostrils 2 (two) times daily. Use in each nostril as directed 30 mL 2   lisinopril  (ZESTRIL ) 10 MG tablet Take 1 tablet (10 mg total) by mouth daily. 90 tablet 3   metoprolol  succinate (TOPROL -XL) 50 MG 24 hr tablet Take 1 tablet (50 mg total) by mouth daily. TAKE WITH OR IMMEDIATELY FOLLOWING A MEAL. 90 tablet 3   naproxen  (NAPROSYN ) 500 MG tablet Take 500 mg by mouth 2 (two) times daily.     tiZANidine  (ZANAFLEX ) 4 MG tablet Take 1 tablet (4 mg total) by mouth every 6 (six) hours as needed (headache). 30 tablet 2   HYDROcodone  bit-homatropine (HYCODAN) 5-1.5 MG/5ML syrup Take 5 mLs by mouth every 8 (eight) hours as needed for cough. 120 mL 0   pneumococcal 20-valent conjugate vaccine (PREVNAR 20 ) 0.5 ML injection Inject into the muscle. 0.5 mL 0   predniSONE  (DELTASONE ) 20 MG tablet Take 1 tablet (20 mg total) by mouth daily with breakfast. (Patient not taking: Reported on 02/25/2024) 5 tablet 0   No facility-administered medications prior to visit.    Allergies  Allergen Reactions   Tetracycline Other (See Comments)    REACTION: rash, passed out REACTION:  passed out    Review of Systems  Constitutional:  Negative for fever.  HENT:  Negative for congestion.   Eyes:  Negative for blurred vision.  Respiratory:  Negative for cough.   Cardiovascular:  Negative for chest pain and palpitations.  Gastrointestinal:  Negative for  vomiting.  Musculoskeletal:  Negative for back pain.  Skin:  Negative for rash.  Neurological:  Negative for loss of consciousness and headaches.       Objective:    Physical Exam Vitals and nursing note reviewed.  Constitutional:      General: She is not in acute distress.    Appearance: Normal appearance. She is well-developed.  HENT:     Head: Normocephalic and atraumatic.  Eyes:     General: No scleral icterus.       Right eye: No discharge.        Left eye: No discharge.  Cardiovascular:     Rate and Rhythm: Normal rate and regular rhythm.     Heart sounds: No murmur heard. Pulmonary:     Effort: Pulmonary effort is normal. No respiratory distress.     Breath sounds: Normal breath sounds.  Musculoskeletal:        General: Normal range of motion.     Cervical back: Normal range of motion and neck supple.     Right lower leg: No edema.     Left lower leg: No edema.  Skin:    General: Skin is warm and dry.  Neurological:     Mental Status: She is alert and oriented to person, place, and time.  Psychiatric:        Mood and Affect: Mood normal.        Behavior: Behavior normal.        Thought Content: Thought content normal.        Judgment: Judgment normal.     BP 110/60 (BP Location: Left Arm, Patient Position: Sitting, Cuff Size: Normal)   Pulse 77   Temp 98.9 F (37.2 C) (Oral)  Resp 16   Ht 5' 1 (1.549 m)   Wt 159 lb 3.2 oz (72.2 kg)   LMP 11/07/2010   SpO2 98%   BMI 30.08 kg/m  Wt Readings from Last 3 Encounters:  05/11/24 159 lb 3.2 oz (72.2 kg)  02/25/24 155 lb (70.3 kg)  12/24/23 162 lb 3.2 oz (73.6 kg)    Diabetic Foot Exam - Simple   No data filed    Lab Results  Component Value Date   WBC 8.8 10/15/2023   HGB 11.6 (L) 10/15/2023   HCT 34.5 (L) 10/15/2023   PLT 227 10/15/2023   GLUCOSE 102 (H) 10/15/2023   CHOL 216 (H) 07/16/2023   TRIG 109.0 07/16/2023   HDL 69.40 07/16/2023   LDLDIRECT 121.7 12/27/2012   LDLCALC 125 (H)  07/16/2023   ALT 17 10/15/2023   AST 21 10/15/2023   NA 140 10/15/2023   K 4.3 10/15/2023   CL 104 10/15/2023   CREATININE 0.80 10/15/2023   BUN 19 10/15/2023   CO2 24 10/15/2023   TSH 0.84 03/09/2018   HGBA1C 5.5 07/16/2023    Lab Results  Component Value Date   TSH 0.84 03/09/2018   Lab Results  Component Value Date   WBC 8.8 10/15/2023   HGB 11.6 (L) 10/15/2023   HCT 34.5 (L) 10/15/2023   MCV 93.8 10/15/2023   PLT 227 10/15/2023   Lab Results  Component Value Date   NA 140 10/15/2023   K 4.3 10/15/2023   CO2 24 10/15/2023   GLUCOSE 102 (H) 10/15/2023   BUN 19 10/15/2023   CREATININE 0.80 10/15/2023   BILITOT 0.5 10/15/2023   ALKPHOS 70 10/15/2023   AST 21 10/15/2023   ALT 17 10/15/2023   PROT 6.7 10/15/2023   ALBUMIN 4.1 10/15/2023   CALCIUM 9.7 10/15/2023   ANIONGAP 11 10/15/2023   GFR 66.51 07/16/2023   Lab Results  Component Value Date   CHOL 216 (H) 07/16/2023   Lab Results  Component Value Date   HDL 69.40 07/16/2023   Lab Results  Component Value Date   LDLCALC 125 (H) 07/16/2023   Lab Results  Component Value Date   TRIG 109.0 07/16/2023   Lab Results  Component Value Date   CHOLHDL 3 07/16/2023   Lab Results  Component Value Date   HGBA1C 5.5 07/16/2023       Assessment & Plan:  Migraine without aura and without status migrainosus, not intractable -     tiZANidine  HCl; Take 1 tablet (4 mg total) by mouth every 6 (six) hours as needed (headache).  Dispense: 30 tablet; Refill: 2 -     Lipid panel; Future -     TSH; Future  Primary hypertension -     Lisinopril ; Take 1 tablet (10 mg total) by mouth daily.  Dispense: 90 tablet; Refill: 3 -     Metoprolol  Succinate ER; Take 1 tablet (50 mg total) by mouth daily. TAKE WITH OR IMMEDIATELY FOLLOWING A MEAL.  Dispense: 90 tablet; Refill: 3 -     CBC with Differential/Platelet; Future -     Comprehensive metabolic panel with GFR; Future -     Lipid panel; Future -     TSH;  Future  Primary osteoarthritis of right knee -     Naproxen ; Take 1 tablet (500 mg total) by mouth 2 (two) times daily with a meal.  Dispense: 60 tablet; Refill: 3  Mixed hyperlipidemia Assessment & Plan: Encourage heart healthy diet such as MIND or  DASH diet, increase exercise, avoid trans fats, simple carbohydrates and processed foods, consider a krill or fish or flaxseed oil cap daily.      Assessment and Plan Assessment & Plan Primary osteoarthritis of right knee   Chronic osteoarthritis in the right knee shows bone-on-bone changes on x-ray. Previous injection was given, and gel therapy is recommended. No knee replacement is currently planned. Naproxen  helps with sleep and pain management. Continue naproxen , consider gel therapy, and engage in exercises to strengthen knee muscles.  Primary hypertension   Hypertension is managed with lisinopril  and metoprolol . Blood pressure medications were recently refilled. Continue lisinopril  and metoprolol .  Migraine without aura   Migraines, previously severe, are now managed with tizanidine  as needed. It effectively stops migraines when taken at onset. Continue tizanidine  as needed.   Alban Marucci R Lowne Chase, DO  "

## 2024-05-13 NOTE — Assessment & Plan Note (Signed)
 Encourage heart healthy diet such as MIND or DASH diet, increase exercise, avoid trans fats, simple carbohydrates and processed foods, consider a krill or fish or flaxseed oil cap daily.

## 2024-05-13 NOTE — Assessment & Plan Note (Signed)
 Well controlled, no changes to meds. Encouraged heart healthy diet such as the DASH diet and exercise as tolerated.

## 2024-07-07 ENCOUNTER — Other Ambulatory Visit

## 2024-07-13 ENCOUNTER — Encounter: Admitting: Family Medicine
